# Patient Record
Sex: Male | Born: 1955
Health system: Southern US, Community
[De-identification: ages and names within clinical notes are randomized; demographics above are authoritative.]

## PROBLEM LIST (undated history)

## (undated) DIAGNOSIS — E7800 Pure hypercholesterolemia, unspecified: Secondary | ICD-10-CM

## (undated) DIAGNOSIS — M25512 Pain in left shoulder: Secondary | ICD-10-CM

## (undated) DIAGNOSIS — Z8719 Personal history of other diseases of the digestive system: Secondary | ICD-10-CM

## (undated) DIAGNOSIS — Z87898 Personal history of other specified conditions: Secondary | ICD-10-CM

## (undated) DIAGNOSIS — K429 Umbilical hernia without obstruction or gangrene: Secondary | ICD-10-CM

## (undated) DIAGNOSIS — I10 Essential (primary) hypertension: Secondary | ICD-10-CM

## (undated) DIAGNOSIS — Z8711 Personal history of peptic ulcer disease: Secondary | ICD-10-CM

## (undated) HISTORY — DX: Pure hypercholesterolemia, unspecified: E78.00

## (undated) HISTORY — PX: COLONOSCOPY: SHX174

---

## 1995-04-29 HISTORY — PX: CARDIAC CATHETERIZATION: SHX172

## 1999-08-01 ENCOUNTER — Ambulatory Visit (HOSPITAL_COMMUNITY): Admission: RE | Admit: 1999-08-01 | Discharge: 1999-08-01 | Payer: Self-pay | Admitting: Endocrinology

## 2000-03-05 ENCOUNTER — Encounter: Admission: RE | Admit: 2000-03-05 | Discharge: 2000-03-05 | Payer: Self-pay | Admitting: Endocrinology

## 2000-03-05 ENCOUNTER — Encounter: Payer: Self-pay | Admitting: Endocrinology

## 2000-05-11 ENCOUNTER — Encounter: Admission: RE | Admit: 2000-05-11 | Discharge: 2000-05-11 | Payer: Self-pay | Admitting: Internal Medicine

## 2000-07-31 ENCOUNTER — Encounter: Admission: RE | Admit: 2000-07-31 | Discharge: 2000-07-31 | Payer: Self-pay | Admitting: Internal Medicine

## 2000-10-05 ENCOUNTER — Encounter: Admission: RE | Admit: 2000-10-05 | Discharge: 2000-10-05 | Payer: Self-pay | Admitting: Internal Medicine

## 2000-12-16 ENCOUNTER — Encounter: Admission: RE | Admit: 2000-12-16 | Discharge: 2000-12-16 | Payer: Self-pay | Admitting: Internal Medicine

## 2000-12-18 ENCOUNTER — Encounter: Admission: RE | Admit: 2000-12-18 | Discharge: 2000-12-18 | Payer: Self-pay | Admitting: Internal Medicine

## 2001-03-01 ENCOUNTER — Other Ambulatory Visit: Admission: RE | Admit: 2001-03-01 | Discharge: 2001-03-01 | Payer: Self-pay | Admitting: *Deleted

## 2001-03-01 ENCOUNTER — Encounter: Admission: RE | Admit: 2001-03-01 | Discharge: 2001-03-01 | Payer: Self-pay | Admitting: Internal Medicine

## 2001-08-10 ENCOUNTER — Encounter: Admission: RE | Admit: 2001-08-10 | Discharge: 2001-08-10 | Payer: Self-pay

## 2001-09-27 ENCOUNTER — Encounter: Admission: RE | Admit: 2001-09-27 | Discharge: 2001-09-27 | Payer: Self-pay | Admitting: Internal Medicine

## 2002-04-11 ENCOUNTER — Encounter: Admission: RE | Admit: 2002-04-11 | Discharge: 2002-04-11 | Payer: Self-pay | Admitting: Internal Medicine

## 2002-04-27 ENCOUNTER — Encounter: Admission: RE | Admit: 2002-04-27 | Discharge: 2002-04-27 | Payer: Self-pay | Admitting: Internal Medicine

## 2002-09-01 ENCOUNTER — Encounter: Admission: RE | Admit: 2002-09-01 | Discharge: 2002-09-01 | Payer: Self-pay | Admitting: Internal Medicine

## 2002-09-15 ENCOUNTER — Encounter: Admission: RE | Admit: 2002-09-15 | Discharge: 2002-09-15 | Payer: Self-pay | Admitting: Internal Medicine

## 2002-09-16 ENCOUNTER — Encounter: Admission: RE | Admit: 2002-09-16 | Discharge: 2002-09-16 | Payer: Self-pay | Admitting: Internal Medicine

## 2010-09-23 ENCOUNTER — Emergency Department (HOSPITAL_COMMUNITY)
Admission: EM | Admit: 2010-09-23 | Discharge: 2010-09-23 | Disposition: A | Discharge status: Home / Self Care | Payer: Medicaid Other | Attending: Emergency Medicine | Admitting: Emergency Medicine

## 2010-09-23 ENCOUNTER — Encounter (HOSPITAL_COMMUNITY): Payer: Self-pay | Admitting: Radiology

## 2010-09-23 ENCOUNTER — Emergency Department (HOSPITAL_COMMUNITY): Payer: Medicaid Other

## 2010-09-23 ENCOUNTER — Inpatient Hospital Stay (INDEPENDENT_AMBULATORY_CARE_PROVIDER_SITE_OTHER)
Admission: RE | Admit: 2010-09-23 | Discharge: 2010-09-23 | Disposition: A | Discharge status: Home / Self Care | Payer: Self-pay | Source: Ambulatory Visit | Attending: Family Medicine | Admitting: Family Medicine

## 2010-09-23 DIAGNOSIS — Z794 Long term (current) use of insulin: Secondary | ICD-10-CM | POA: Insufficient documentation

## 2010-09-23 DIAGNOSIS — E119 Type 2 diabetes mellitus without complications: Secondary | ICD-10-CM | POA: Insufficient documentation

## 2010-09-23 DIAGNOSIS — I10 Essential (primary) hypertension: Secondary | ICD-10-CM | POA: Insufficient documentation

## 2010-09-23 DIAGNOSIS — K3580 Unspecified acute appendicitis: Secondary | ICD-10-CM

## 2010-09-23 DIAGNOSIS — Z79899 Other long term (current) drug therapy: Secondary | ICD-10-CM | POA: Insufficient documentation

## 2010-09-23 DIAGNOSIS — R1031 Right lower quadrant pain: Secondary | ICD-10-CM | POA: Insufficient documentation

## 2010-09-23 HISTORY — DX: Essential (primary) hypertension: I10

## 2010-09-23 LAB — CBC
HCT: 42.5 % (ref 39.0–52.0)
Hemoglobin: 14.6 g/dL (ref 13.0–17.0)
MCH: 29.2 pg (ref 26.0–34.0)
MCHC: 34.4 g/dL (ref 30.0–36.0)
MCV: 85 fL (ref 78.0–100.0)
Platelets: 243 10*3/uL (ref 150–400)
RBC: 5 MIL/uL (ref 4.22–5.81)
RDW: 13.3 % (ref 11.5–15.5)
WBC: 7.6 10*3/uL (ref 4.0–10.5)

## 2010-09-23 LAB — POCT URINALYSIS DIP (DEVICE)
Bilirubin Urine: NEGATIVE
Glucose, UA: 250 mg/dL — AB
Hgb urine dipstick: NEGATIVE
Ketones, ur: NEGATIVE mg/dL
Nitrite: NEGATIVE
Protein, ur: NEGATIVE mg/dL
Specific Gravity, Urine: 1.02 (ref 1.005–1.030)
Urobilinogen, UA: 0.2 mg/dL (ref 0.0–1.0)
pH: 6 (ref 5.0–8.0)

## 2010-09-23 LAB — URINALYSIS, ROUTINE W REFLEX MICROSCOPIC
Bilirubin Urine: NEGATIVE
Glucose, UA: NEGATIVE mg/dL
Hgb urine dipstick: NEGATIVE
Ketones, ur: NEGATIVE mg/dL
Nitrite: NEGATIVE
pH: 6.5 (ref 5.0–8.0)

## 2010-09-23 LAB — COMPREHENSIVE METABOLIC PANEL
ALT: 20 U/L (ref 0–53)
AST: 19 U/L (ref 0–37)
CO2: 25 mEq/L (ref 19–32)
Calcium: 9.1 mg/dL (ref 8.4–10.5)
GFR calc Af Amer: >60 mL/min (ref >60)
GFR calc non Af Amer: >60 mL/min (ref >60)
Sodium: 136 mEq/L (ref 135–145)

## 2010-09-23 LAB — DIFFERENTIAL
Basophils Absolute: 0 10*3/uL (ref 0.0–0.1)
Basophils Relative: 0 % (ref 0–1)
Eosinophils Absolute: 0 10*3/uL (ref 0.0–0.7)
Eosinophils Relative: 0 % (ref 0–5)
Lymphocytes Relative: 13 % (ref 12–46)
Lymphs Abs: 1 10*3/uL (ref 0.7–4.0)
Monocytes Absolute: 0.4 10*3/uL (ref 0.1–1.0)
Monocytes Relative: 5 % (ref 3–12)
Neutro Abs: 6.2 10*3/uL (ref 1.7–7.7)
Neutrophils Relative %: 82 % — ABNORMAL HIGH (ref 43–77)

## 2010-09-23 LAB — GLUCOSE, CAPILLARY: Glucose-Capillary: 124 mg/dL — ABNORMAL HIGH (ref 70–99)

## 2010-09-23 MED ORDER — IOHEXOL 300 MG/ML  SOLN
100.0000 mL | Freq: Once | INTRAMUSCULAR | Status: AC | PRN
  Administered 2010-09-23: 100 mL via INTRAVENOUS

## 2011-06-26 ENCOUNTER — Encounter (HOSPITAL_COMMUNITY): Payer: Self-pay | Admitting: Emergency Medicine

## 2011-06-26 ENCOUNTER — Emergency Department (INDEPENDENT_AMBULATORY_CARE_PROVIDER_SITE_OTHER)
Admission: EM | Admit: 2011-06-26 | Discharge: 2011-06-26 | Disposition: A | Discharge status: Home Health | Payer: Self-pay | Source: Home / Self Care | Attending: Emergency Medicine | Admitting: Emergency Medicine

## 2011-06-26 DIAGNOSIS — S161XXA Strain of muscle, fascia and tendon at neck level, initial encounter: Secondary | ICD-10-CM

## 2011-06-26 DIAGNOSIS — S139XXA Sprain of joints and ligaments of unspecified parts of neck, initial encounter: Secondary | ICD-10-CM

## 2011-06-26 DIAGNOSIS — S335XXA Sprain of ligaments of lumbar spine, initial encounter: Secondary | ICD-10-CM

## 2011-06-26 DIAGNOSIS — S39012A Strain of muscle, fascia and tendon of lower back, initial encounter: Secondary | ICD-10-CM

## 2011-06-26 MED ORDER — MELOXICAM 15 MG PO TABS: 15.0000 mg | ORAL_TABLET | Freq: Every day | ORAL | Status: AC

## 2011-06-26 MED ORDER — HYDROCODONE-ACETAMINOPHEN 5-325 MG PO TABS: 2.0000 | ORAL_TABLET | ORAL | Status: AC | PRN

## 2011-06-26 MED ORDER — METHOCARBAMOL 500 MG PO TABS: 500.0000 mg | ORAL_TABLET | Freq: Four times a day (QID) | ORAL | Status: AC
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
## 2011-06-26 NOTE — Discharge Instructions (Signed)
Take the medication as written. Take 1 gram of tylenol with the motrin up to 4 times a day as needed for pain and fever. This is an effective combination for pain. Take the hydrocodone/norco only for severe pain. Do not take the tylenol and hydrocodone/norco/  as they both have tylenol in them and too much can hurt your liver. Do the exercises twice a day for 5- 10 seconds at a time when you are feeling better. Return if you get worse, have a persistent fever >100.4, or for any concerns.

## 2011-08-03 ENCOUNTER — Other Ambulatory Visit (HOSPITAL_COMMUNITY): Payer: Self-pay | Admitting: Chiropractic Medicine

## 2011-08-03 ENCOUNTER — Ambulatory Visit (HOSPITAL_COMMUNITY)
Admission: RE | Admit: 2011-08-03 | Discharge: 2011-08-03 | Disposition: A | Discharge status: Home / Self Care | Payer: Self-pay | Source: Ambulatory Visit | Attending: Chiropractic Medicine | Admitting: Chiropractic Medicine

## 2011-08-03 DIAGNOSIS — M545 Low back pain, unspecified: Secondary | ICD-10-CM | POA: Insufficient documentation

## 2011-08-03 DIAGNOSIS — R52 Pain, unspecified: Secondary | ICD-10-CM

## 2012-11-15 ENCOUNTER — Telehealth: Payer: Self-pay | Admitting: Endocrinology

## 2012-11-15 NOTE — Telephone Encounter (Signed)
Instructed pt to call his insurance company to find out what strips they will pay for, he will call back when he finds out

## 2012-11-18 ENCOUNTER — Other Ambulatory Visit: Payer: Self-pay | Admitting: *Deleted

## 2012-11-18 MED ORDER — INSULIN ASPART 100 UNIT/ML FLEXPEN: 20.0000 [IU] | PEN_INJECTOR | Freq: Three times a day (TID) | SUBCUTANEOUS | Status: DC

## 2012-11-22 ENCOUNTER — Other Ambulatory Visit: Payer: Self-pay | Admitting: *Deleted

## 2012-11-22 MED ORDER — GLUCOSE BLOOD VI STRP: ORAL_STRIP | Status: DC

## 2012-11-26 ENCOUNTER — Other Ambulatory Visit: Payer: Self-pay | Admitting: *Deleted

## 2012-11-26 MED ORDER — ATORVASTATIN CALCIUM 20 MG PO TABS: 20.0000 mg | ORAL_TABLET | Freq: Every day | ORAL | Status: DC

## 2012-11-26 MED ORDER — INSULIN GLARGINE 100 UNIT/ML ~~LOC~~ SOLN: SUBCUTANEOUS | Status: DC

## 2012-12-08 ENCOUNTER — Ambulatory Visit: Payer: Medicaid Other | Admitting: Endocrinology

## 2012-12-10 ENCOUNTER — Ambulatory Visit (INDEPENDENT_AMBULATORY_CARE_PROVIDER_SITE_OTHER): Payer: No Typology Code available for payment source | Admitting: Endocrinology

## 2012-12-10 ENCOUNTER — Other Ambulatory Visit: Payer: Self-pay | Admitting: *Deleted

## 2012-12-10 ENCOUNTER — Encounter: Payer: Self-pay | Admitting: Endocrinology

## 2012-12-10 VITALS — BP 140/84 | HR 56 | Temp 97.9°F | Resp 12 | Ht 65.25 in | Wt 185.3 lb

## 2012-12-10 DIAGNOSIS — G5710 Meralgia paresthetica, unspecified lower limb: Secondary | ICD-10-CM

## 2012-12-10 DIAGNOSIS — IMO0001 Reserved for inherently not codable concepts without codable children: Secondary | ICD-10-CM

## 2012-12-10 DIAGNOSIS — E118 Type 2 diabetes mellitus with unspecified complications: Secondary | ICD-10-CM | POA: Insufficient documentation

## 2012-12-10 DIAGNOSIS — E7800 Pure hypercholesterolemia, unspecified: Secondary | ICD-10-CM

## 2012-12-10 DIAGNOSIS — G5711 Meralgia paresthetica, right lower limb: Secondary | ICD-10-CM

## 2012-12-10 DIAGNOSIS — I10 Essential (primary) hypertension: Secondary | ICD-10-CM | POA: Insufficient documentation

## 2012-12-10 LAB — MICROALBUMIN / CREATININE URINE RATIO
Creatinine,U: 154.2 mg/dL
Microalb Creat Ratio: 0.6 mg/g (ref 0.0–30.0)
Microalb, Ur: 1 mg/dL (ref 0.0–1.9)

## 2012-12-10 LAB — LIPID PANEL
HDL: 47.5 mg/dL (ref >39.00)
Total CHOL/HDL Ratio: 4
Triglycerides: 103 mg/dL (ref 0.0–149.0)
VLDL: 20.6 mg/dL (ref 0.0–40.0)

## 2012-12-10 LAB — COMPREHENSIVE METABOLIC PANEL
BUN: 10 mg/dL (ref 6–23)
CO2: 29 mEq/L (ref 19–32)
Calcium: 8.9 mg/dL (ref 8.4–10.5)
Chloride: 103 mEq/L (ref 96–112)
Creatinine, Ser: 0.8 mg/dL (ref 0.4–1.5)
GFR: 131.56 mL/min (ref >60.00)
Glucose, Bld: 199 mg/dL — ABNORMAL HIGH (ref 70–99)

## 2012-12-10 LAB — URINALYSIS, ROUTINE W REFLEX MICROSCOPIC
Hgb urine dipstick: NEGATIVE
Ketones, ur: NEGATIVE
RBC / HPF: NONE SEEN (ref 0–?)
Total Protein, Urine: NEGATIVE
Urine Glucose: >=1000
Urobilinogen, UA: 0.2 (ref 0.0–1.0)

## 2012-12-10 MED ORDER — GLUCOSE BLOOD VI STRP: ORAL_STRIP | Status: DC

## 2012-12-10 MED ORDER — AMLODIPINE BESYLATE 2.5 MG PO TABS: 2.5000 mg | ORAL_TABLET | Freq: Every day | ORAL | Status: DC

## 2012-12-10 NOTE — Progress Notes (Signed)
Patient ID: Austin Norris, male   DOB: 1955-11-12, 57 y.o.   MRN: [id number]  Austin Norris is an 57 y.o. male.   Reason for Appointment: Diabetes follow-up   History of Present Illness   Diagnosis: Type 2 DIABETES MELITUS, long-standing      He has been on insulin to treat his diabetes for several years partly because of failure of oral agents and also for reducing cost of medications He is generally checking his blood sugars once or twice a day but does not keep a record Difficult to review his blood sugars at home since he has a generic monitor and not clear if date and time is accurate Also because of his recent religious fast he is eating and waking up at 3 or 4 in the morning along with late evening meals. Not clear which readings are postprandial  He thinks that his blood sugars may be sporadically over 200 from eating sweets but he does not plan ahead and give himself extra insulin. He will take extra insulin when the blood sugar goes up after eating. He also does not like to take any mealtime coverage for his lunch because he is at work and is walking more. Usually does not check readings after lunch.  Oral hypoglycemic drugs: Metformin        Side effects from medications: None Insulin regimen: Lantus usually 48 units at bedtime, NovoLog 20 before meals         Proper timing of medications in relation to meals: Yes.         Monitors blood glucose: Once a day.    Glucometer: One Touch.          Blood Glucose readings from meter download: readings before breakfast: 155 136, after breakfast 281;    hs 98, 168.  Hypoglycemia frequency:  none recently.          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  Always use your most recent med list.               atorvastatin 20 MG tablet  Commonly known as:  LIPITOR  Take 1 tablet (20 mg total) by mouth daily.     insulin aspart 100 UNIT/ML Sopn FlexPen  Commonly known as:  NOVOLOG FLEXPEN  Inject 20 Units into the skin 3 (three) times daily with meals.     insulin glargine 100 UNIT/ML injection  Commonly known as:  LANTUS  Inject 48 units at bedtime     lisinopril 40 MG tablet  Commonly known as:  PRINIVIL,ZESTRIL  Take 40 mg by mouth daily.     metFORMIN 500 MG (MOD) 24 hr tablet  Commonly known as:  GLUMETZA  Take 500 mg by mouth 2 (two) times daily with a meal.        Allergies: No Known Allergies  Past Medical History  Diagnosis Date  . Diabetes mellitus   .  Hypertension     No past surgical history on file.  No family history on file.  Social History:  reports that he has never smoked. He does not have any smokeless tobacco history on file. He reports that he does not drink alcohol or use illicit drugs.  Review of Systems:  HYPERTENSION:  he has had long-standing hypertension, he thinks his systolic readings are 140-160 at home, frequently high but he does not keep a record. Is on HCTZ along with his lisinopril and blood pressure appears to be relatively high at home and somewhat high in the office today  HYPERLIPIDEMIA: The lipid abnormality consists of elevated LDL. Again has been irregular with taking his Lipitor and he is just not wanting to take medications. He has had a history of coronary artery disease but not clear to what extent, records not available  No history of numbness or tingling in his feet. However he is complaining about persistent numbness in his right outer thigh with occasional sharp pains not interfering with sleep     Examination:   BP 140/84  Pulse 56  Temp(Src) 97.9 F (36.6 C)  Resp 12  Ht 5' 5.25"  (1.657 m)  Wt 185 lb 4.8 oz (84.052 kg)  BMI 30.61 kg/m2  SpO2 96%  Body mass index is 30.61 kg/(m^2).   He has a slightly raised oval dark pigmented area on the left proximal foot with some dryness on the surface No pedal edema  ASSESSMENT/ PLAN::   Diabetes type 2   The patient's diabetes control appears to be fair with some high readings if he is not watching his diet Surprisingly he is having relatively good readings without even doing the Lantus at night while fasting during the day He has not checked many readings after meals and difficult to assess his blood sugar patterns as he does not keep a record of his blood sugars Also not clear if his generic monitor is accurate, difficulty the time on this   Meralgia paresthetica: He still has some numbness which is persistent and occasional pain, does not require medications for discomfort  HYPERTENSION: His blood pressure is still relatively high and he thinks it is higher at home. Will add amlodipine 2.5 mg  Hyperlipidemia: He is not taking his Lipitor regularly because of fear of side effects. Discussed importance of managing hyperlipidemia and  treatment with statins with having 2 other risk factors at least for cardiovascular prevention  No evidence of peripheral neuropathy on exam today  Austin Norris 12/10/2012, 11:14 AM

## 2012-12-10 NOTE — Patient Instructions (Signed)
Please check blood sugars at least half the time about 2 hours after any meal and as directed on waking up. Please bring blood sugar monitor to each visit  Take Lipitor daily

## 2012-12-14 ENCOUNTER — Other Ambulatory Visit: Payer: Self-pay | Admitting: *Deleted

## 2012-12-14 MED ORDER — ATORVASTATIN CALCIUM 20 MG PO TABS: 20.0000 mg | ORAL_TABLET | Freq: Every day | ORAL | Status: DC

## 2012-12-20 ENCOUNTER — Other Ambulatory Visit: Payer: Self-pay | Admitting: *Deleted

## 2012-12-20 MED ORDER — INSULIN ASPART 100 UNIT/ML FLEXPEN: 20.0000 [IU] | PEN_INJECTOR | Freq: Three times a day (TID) | SUBCUTANEOUS | Status: DC

## 2012-12-21 ENCOUNTER — Other Ambulatory Visit: Payer: Self-pay | Admitting: *Deleted

## 2013-01-31 ENCOUNTER — Other Ambulatory Visit: Payer: No Typology Code available for payment source

## 2013-03-11 ENCOUNTER — Other Ambulatory Visit: Payer: Self-pay | Admitting: *Deleted

## 2013-03-11 ENCOUNTER — Other Ambulatory Visit (INDEPENDENT_AMBULATORY_CARE_PROVIDER_SITE_OTHER): Payer: No Typology Code available for payment source

## 2013-03-11 DIAGNOSIS — E7800 Pure hypercholesterolemia, unspecified: Secondary | ICD-10-CM

## 2013-03-11 DIAGNOSIS — IMO0001 Reserved for inherently not codable concepts without codable children: Secondary | ICD-10-CM

## 2013-03-11 LAB — COMPREHENSIVE METABOLIC PANEL
ALT: 15 U/L (ref 0–53)
CO2: 28 mEq/L (ref 19–32)
Calcium: 9.2 mg/dL (ref 8.4–10.5)
Chloride: 101 mEq/L (ref 96–112)
Creatinine, Ser: 1 mg/dL (ref 0.4–1.5)
GFR: 102.21 mL/min (ref >60.00)
Sodium: 136 mEq/L (ref 135–145)
Total Protein: 6.9 g/dL (ref 6.0–8.3)

## 2013-03-11 LAB — HEMOGLOBIN A1C: Hgb A1c MFr Bld: 7.9 % — ABNORMAL HIGH (ref 4.6–6.5)

## 2013-03-11 LAB — LIPID PANEL: Triglycerides: 173 mg/dL — ABNORMAL HIGH (ref 0.0–149.0)

## 2013-03-11 LAB — MICROALBUMIN / CREATININE URINE RATIO: Microalb, Ur: 0.2 mg/dL (ref 0.0–1.9)

## 2013-03-11 MED ORDER — QUINAPRIL HCL 40 MG PO TABS: 40.0000 mg | ORAL_TABLET | Freq: Every day | ORAL | Status: DC

## 2013-03-14 ENCOUNTER — Ambulatory Visit (INDEPENDENT_AMBULATORY_CARE_PROVIDER_SITE_OTHER): Payer: No Typology Code available for payment source | Admitting: Endocrinology

## 2013-03-14 ENCOUNTER — Encounter: Payer: Self-pay | Admitting: Endocrinology

## 2013-03-14 ENCOUNTER — Other Ambulatory Visit: Payer: Self-pay | Admitting: *Deleted

## 2013-03-14 ENCOUNTER — Ambulatory Visit: Payer: No Typology Code available for payment source | Admitting: Endocrinology

## 2013-03-14 VITALS — BP 110/66 | HR 71 | Temp 98.6°F | Resp 12 | Ht 65.0 in | Wt 192.0 lb

## 2013-03-14 DIAGNOSIS — I10 Essential (primary) hypertension: Secondary | ICD-10-CM

## 2013-03-14 DIAGNOSIS — Z23 Encounter for immunization: Secondary | ICD-10-CM

## 2013-03-14 DIAGNOSIS — IMO0001 Reserved for inherently not codable concepts without codable children: Secondary | ICD-10-CM

## 2013-03-14 DIAGNOSIS — R1084 Generalized abdominal pain: Secondary | ICD-10-CM

## 2013-03-14 DIAGNOSIS — E7800 Pure hypercholesterolemia, unspecified: Secondary | ICD-10-CM

## 2013-03-14 MED ORDER — CANAGLIFLOZIN 100 MG PO TABS: 100.0000 mg | ORAL_TABLET | Freq: Every day | ORAL | Status: DC

## 2013-03-14 MED ORDER — INSULIN GLARGINE 100 UNIT/ML ~~LOC~~ SOLN: SUBCUTANEOUS | Status: DC

## 2013-03-14 NOTE — Progress Notes (Signed)
Patient ID: Austin Norris, male   DOB: [birth year], 57 y.o.   MRN: [id number]   Reason for Appointment: Diabetes follow-up   History of Present Illness   Diagnosis: Type 2 DIABETES MELITUS, long-standing     He has been on insulin to treat his diabetes for several years partly because of failure of oral agents and also for reducing cost of medications He is generally checking his blood sugars once or twice a day but does not keep a record Overall has had somewhat poor control Usually did not bring his monitor for review and does not keep a record of his glucose readings He is probably checking his blood sugars mostly in the mornings and occasionally at bedtime However his blood sugars appear to be higher after breakfast when they are checked in the lab  He thinks that his blood sugars may be sporadically over 200 from eating sweets at times. He also does not like to take any mealtime coverage for his lunch because he is at work and is walking more. Usually does not check readings after lunch.  Oral hypoglycemic drugs: Metformin 1g at dinner       Side effects from medications: None Insulin regimen: Lantus usually 48 units at bedtime, NovoLog 20-25 before supper        Proper timing of medications in relation to meals: Yes.         Monitors blood glucose: Once a day.    Glucometer: ?         Blood Glucose readings from meter download: readings before breakfast: 110-130 usually; hs 140-170  Hypoglycemia frequency:  none recently.           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 Microalb, Ur 03/11/2013 0.2  0.0 - 1.9 mg/dL Final  . Creatinine,U 78/29/5621 95.5   Final  . Microalb Creat Ratio 03/11/2013 0.2  0.0 - 30.0 mg/g Final  . Cholesterol 03/11/2013 191  0 - 200 mg/dL Final   ATP III Classification       Desirable:  < 200 mg/dL               Borderline High:  200 - 239 mg/dL          High:  > = 308 mg/dL  . Triglycerides 03/11/2013 173.0* 0.0 - 149.0 mg/dL Final   Normal:  <657 mg/dLBorderline High:  150 - 199 mg/dL  . HDL 03/11/2013 42.30  >39.00 mg/dL Final  . VLDL 84/69/6295 34.6  0.0 - 40.0 mg/dL Final  . LDL Cholesterol 03/11/2013 114* 0 - 99 mg/dL Final  . Total CHOL/HDL Ratio 03/11/2013 5   Final  Men          Women1/2 Average Risk     3.4          3.3Average Risk          5.0          4.42X Average Risk          9.6          7.13X Average Risk          15.0          11.0                      . Hemoglobin A1C 03/11/2013 7.9* 4.6 - 6.5 % Final   Glycemic Control Guidelines for People with Diabetes:Non Diabetic:  <6%Goal of Therapy: <7%Additional Action Suggested:  >8%   . Sodium 03/11/2013 136  135 - 145 mEq/L Final  . Potassium 03/11/2013 4.3  3.5 - 5.1 mEq/L Final  . Chloride 03/11/2013 101  96 - 112 mEq/L Final  . CO2 03/11/2013 28  19 - 32 mEq/L Final  . Glucose, Bld 03/11/2013 192* 70 - 99 mg/dL Final  . BUN 56/21/3086 11  6 - 23 mg/dL Final  . Creatinine, Ser 03/11/2013 1.0  0.4 - 1.5 mg/dL Final  . Total Bilirubin 03/11/2013 0.5  0.3 - 1.2 mg/dL Final  . Alkaline Phosphatase 03/11/2013 71  39 - 117 U/L Final  . AST 03/11/2013 19  0 - 37 U/L Final  . ALT 03/11/2013 15  0 - 53 U/L Final  . Total Protein 03/11/2013 6.9  6.0 - 8.3 g/dL Final  . Albumin 57/84/6962 3.9  3.5 - 5.2 g/dL Final  . Calcium 95/28/4132 9.2  8.4 - 10.5 mg/dL Final  . GFR 44/04/270  102.21  >60.00 mL/min Final      Medication List       This list is accurate as of: 03/14/13  8:46 AM.  Always use your most recent med list.               amLODipine 2.5 MG tablet  Commonly known as:  NORVASC  Take 1 tablet (2.5 mg total) by mouth daily.     atorvastatin 20 MG tablet  Commonly known as:  LIPITOR  Take 1 tablet (20 mg total) by mouth daily.     glucose blood test strip  Commonly known as:  FREESTYLE TEST STRIPS  Use as instructed to check blood sugars once a day     hydrochlorothiazide 12.5 MG capsule  Commonly known as:  MICROZIDE  Take 12.5 mg by mouth daily.     insulin aspart 100 UNIT/ML Sopn FlexPen  Commonly known as:  NOVOLOG FLEXPEN  Inject 20 Units into the skin 3 (three) times daily with meals.     insulin glargine 100 UNIT/ML injection  Commonly known as:  LANTUS  Inject 48 units at bedtime     lisinopril 40 MG tablet  Commonly known as:  PRINIVIL,ZESTRIL  Take 40 mg by mouth daily.     metFORMIN 500 MG (MOD) 24 hr tablet  Commonly known as:  GLUMETZA  Take 500 mg by mouth 2 (two) times daily with a meal.     quinapril 40 MG tablet  Commonly known as:  ACCUPRIL  Take 1 tablet (40 mg total) by mouth at bedtime.        Allergies: No Known Allergies  Past Medical History  Diagnosis Date  . Diabetes mellitus   .  Hypertension     No past surgical history on file.  No family history on file. No colon cancer  Social History:  reports that he has never smoked. He does not have any smokeless tobacco history on file. He reports that he does not drink alcohol or use illicit drugs.  Review of Systems:  HYPERTENSION:  he has had long-standing hypertension, he thinks his systolic readings are 130-140 Is taking low dose amlodipine and HCTZ along with his Accupril and blood pressure appears to be lower than usual  HYPERLIPIDEMIA: The lipid abnormality consists of elevated LDL. Again has been irregular with taking his Lipitor and he  is just not wanting to take an additional medication. Discussed importance of prevention of CAD with this. Has had some evidence of CAD in the past but currently asymptomatic  No history of numbness or tingling in his feet. Has persistent numbness in his right outer thigh with occasional sharp pains not interfering with sleep, likely to be meralgia paresthetica  He is complaining of nonspecific gaseousness and postprandial discomfort which is chronic especially with certain foods      Examination:   BP 110/66  Pulse 71  Temp(Src) 98.6 F (37 C)  Resp 12  Ht 5\' 5"  (1.651 m)  Wt 192 lb (87.091 kg)  BMI 31.95 kg/m2  SpO2 96%  Body mass index is 31.95 kg/(m^2).   No pedal edema  ASSESSMENT/ PLAN:   Diabetes type 2   The patient's diabetes control appears to be  still inadequate with A1c near 8% Since his fasting readings are reportedly fairly good and he does not always have high readings after supper he may be having high readings during the day including after a high carbohydrate breakfast Usually does not bring his glucose monitor to assess his readings and lab glucose is frequently high. Still using generic monitor  Also he has gained weight because of inadequate diet management when traveling   He is a good candidate for adding Invokana to his regimen since he has persistent high blood sugars and difficulty losing weight as well as probable hyperglycemia during the day. Discussed mechanism of action, benefits with hyperglycemia, weight loss and blood pressure as well as long-term benefit on glucose control and weight. He will start with 100 mg and take it in the morning, starting with samples He will need to reduce his Lantus to about 45 and probably NovoLog by about 5 minutes He will still need to take some insulin for his breakfast and check more readings after breakfast and afternoon snack Reminded him to check more readings after meals and bring monitor for  review  HYPERTENSION: His blood pressure is  excellent, since he is complaining of diaphoresis with HCTZ we will stop this and may have good control with adding Invokana.  Hyperlipidemia: He is not taking his Lipitor regularly because of fear of side effects. Discussed importance of managing hyperlipidemia and  treatment with statins with having 2 other risk factors at least for cardiovascular prevention  Colon cancer Screening: He will be referred for colonoscopy and he can discuss his long-standing GI symptoms with gastroenterologist Have also reminded him to have a full exam with PCP   [facility] 03/14/2013, 8:46 AM

## 2013-03-14 NOTE — Patient Instructions (Signed)
Please check blood sugars at least half the time about 2 hours after any meal and as directed on waking up.  Please bring blood sugar monitor to each visit  Stop Amlodipine and Hydrochlorthiazide  Invokana 100mg  in ams  Take at least 5 Novolog at breakfast  Watch diet, less starches

## 2013-03-16 ENCOUNTER — Ambulatory Visit (INDEPENDENT_AMBULATORY_CARE_PROVIDER_SITE_OTHER): Payer: No Typology Code available for payment source | Admitting: Internal Medicine

## 2013-03-16 ENCOUNTER — Encounter: Payer: Self-pay | Admitting: Internal Medicine

## 2013-03-16 VITALS — BP 138/70 | HR 60 | Ht 65.0 in | Wt 193.8 lb

## 2013-03-16 DIAGNOSIS — R143 Flatulence: Secondary | ICD-10-CM

## 2013-03-16 DIAGNOSIS — E1165 Type 2 diabetes mellitus with hyperglycemia: Secondary | ICD-10-CM

## 2013-03-16 DIAGNOSIS — Z1211 Encounter for screening for malignant neoplasm of colon: Secondary | ICD-10-CM

## 2013-03-16 DIAGNOSIS — R141 Gas pain: Secondary | ICD-10-CM

## 2013-03-16 MED ORDER — MOVIPREP 100 G PO SOLR: 1.0000 | Freq: Once | ORAL | Status: DC

## 2013-03-16 NOTE — Patient Instructions (Signed)

## 2013-03-16 NOTE — Progress Notes (Signed)
HISTORY OF PRESENT ILLNESS:  Austin Norris is a 57 y.o. male with long-standing type 2 diabetes mellitus, hypertension, and hyperlipidemia. He is sent today by his primary provider regarding chronic abdominal complaints and the need for screening colonoscopy. Patient tells me that he will underwent colonoscopy greater than 10 years ago. He is not sure where, with whom, or the results. His chronic GI complaint is that of bloating. This is most noticeable after certain meals such as fried foods, oils, or seeds. Also, he notices bloating with stress. No true pain. Remote history of ulcer disease. Bowel habits are regular. Weight has been stable. Does have some reflux symptoms when he consumes citrus. No dysphagia. No NSAID use. No other complaints.  REVIEW OF SYSTEMS:  All non-GI ROS negative except for headache  Past Medical History  Diagnosis Date  . Diabetes mellitus   . Hypertension   . Hypercholesteremia     History reviewed. No pertinent past surgical history.  Social History Austin Norris  reports that he has quit smoking. He has never used smokeless tobacco. He reports that he does not drink alcohol or use illicit drugs.  family history includes Diabetes in his father and sister; Heart disease in his mother.  No Known Allergies     PHYSICAL EXAMINATION: Vital signs: BP 138/70  Pulse 60  Ht 5\' 5"  (1.651 m)  Wt 193 lb 12.8 oz (87.907 kg)  BMI 32.25 kg/m2  Constitutional: generally well-appearing, no acute distress Psychiatric: alert and oriented x3, cooperative Eyes: extraocular movements intact, anicteric, conjunctiva pink Mouth: oral pharynx moist, no lesions Neck: supple no lymphadenopathy Cardiovascular: heart regular rate and rhythm, no murmur Lungs: clear to auscultation bilaterally Abdomen: soft, nontender, nondistended, no obvious ascites, no peritoneal signs, normal bowel sounds, no organomegaly Rectal: Deferred until colonoscopy Extremities: no lower  extremity edema bilaterally Skin: no lesions on visible extremities Neuro: No focal deficits.   ASSESSMENT:  #1. Abdominal gas and bloating. Chronic. Exacerbated by certain meals and stress. Sounds entirely functional. #2. Colon cancer screening. Baseline risk. No contraindications. #3. General medical problems including insulin requiring diabetes   PLAN:  #1. Information on intestinal gas. #2. Anti-gas and flatulence dietary sheet #3. Screening colonoscopy.The nature of the procedure, as well as the risks, benefits, and alternatives were carefully and thoroughly reviewed with the patient. Ample time for discussion and questions allowed. The patient understood, was satisfied, and agreed to proceed. #4. Movi prep prescribed. Patient instructed on its use #5. Give one half evening insulin. Hold a.m. metformin and insulin the day of the procedure until regular by mouth intake resumed

## 2013-03-17 ENCOUNTER — Other Ambulatory Visit: Payer: Self-pay | Admitting: *Deleted

## 2013-03-17 MED ORDER — METFORMIN HCL ER (MOD) 500 MG PO TB24: 500.0000 mg | ORAL_TABLET | Freq: Two times a day (BID) | ORAL | Status: DC

## 2013-03-18 ENCOUNTER — Telehealth: Payer: Self-pay | Admitting: Gastroenterology

## 2013-03-18 DIAGNOSIS — R143 Flatulence: Secondary | ICD-10-CM

## 2013-03-18 DIAGNOSIS — R141 Gas pain: Secondary | ICD-10-CM

## 2013-03-18 DIAGNOSIS — Z1211 Encounter for screening for malignant neoplasm of colon: Secondary | ICD-10-CM

## 2013-03-18 MED ORDER — MOVIPREP 100 G PO SOLR: 1.0000 | Freq: Once | ORAL | Status: DC

## 2013-03-18 MED ORDER — PEG-KCL-NACL-NASULF-NA ASC-C 100 G PO SOLR: 1.0000 | Freq: Once | ORAL | Status: DC

## 2013-03-18 NOTE — Telephone Encounter (Signed)
moviprep not at his pharmacy so I called in again

## 2013-03-21 ENCOUNTER — Encounter: Payer: No Typology Code available for payment source | Admitting: Internal Medicine

## 2013-03-21 ENCOUNTER — Ambulatory Visit (AMBULATORY_SURGERY_CENTER): Payer: No Typology Code available for payment source | Admitting: Internal Medicine

## 2013-03-21 ENCOUNTER — Encounter: Payer: Self-pay | Admitting: Internal Medicine

## 2013-03-21 VITALS — BP 152/75 | HR 58 | Temp 97.5°F | Resp 18 | Ht 65.0 in | Wt 193.0 lb

## 2013-03-21 DIAGNOSIS — Z1211 Encounter for screening for malignant neoplasm of colon: Secondary | ICD-10-CM

## 2013-03-21 DIAGNOSIS — D126 Benign neoplasm of colon, unspecified: Secondary | ICD-10-CM

## 2013-03-21 LAB — GLUCOSE, CAPILLARY: Glucose-Capillary: 119 mg/dL — ABNORMAL HIGH (ref 70–99)

## 2013-03-21 MED ORDER — SODIUM CHLORIDE 0.9 % IV SOLN: 500.0000 mL | INTRAVENOUS | Status: DC

## 2013-03-21 NOTE — Progress Notes (Signed)
Patient did not experience any of the following events: a burn prior to discharge; a fall within the facility; wrong site/side/patient/procedure/implant event; or a hospital transfer or hospital admission upon discharge from the facility. (G8907) Patient did not have preoperative order for IV antibiotic SSI prophylaxis. (G8918)  

## 2013-03-21 NOTE — Patient Instructions (Signed)

## 2013-03-21 NOTE — Op Note (Signed)
[facility] [address] [place], [address]   COLONOSCOPY PROCEDURE REPORT  PATIENT: Norris, Austin  MR#: [id number] BIRTHDATE: 01/25/1956 , 57  yrs. old GENDER: Male ENDOSCOPIST: Roxy Cedar, MD REFERRED XB:JYNW Kumar, M.D. PROCEDURE DATE:  03/21/2013 PROCEDURE:   Colonoscopy with snare polypectomy x 2 First Screening Colonoscopy - Avg.  risk and is 50 yrs.  old or older Yes.  Prior Negative Screening - Now for repeat screening. N/A  History of Adenoma - Now for follow-up colonoscopy & has been > or = to 3 yrs.  N/A  Polyps Removed Today? Yes. ASA CLASS:   Class II INDICATIONS:average risk screening. MEDICATIONS: MAC sedation, administered by CRNA and propofol (Diprivan) 340mg  IV  DESCRIPTION OF PROCEDURE:   After the risks benefits and alternatives of the procedure were thoroughly explained, informed consent was obtained.  A digital rectal exam revealed no abnormalities of the rectum.   The LB [id number]  endoscope was introduced through the anus and advanced to the cecum, which was identified by both the appendix and ileocecal valve. No adverse events experienced.   The quality of the prep was excellent, using MoviPrep  The instrument was then slowly withdrawn as the colon was fully examined.    COLON FINDINGS: Two diminutive polyps were found at the cecum and in the sigmoid colon.  A polypectomy was performed with a cold snare. The resection was complete and the polyp tissue was completely retrieved.   The colon mucosa was otherwise normal.  Retroflexed views revealed no abnormalities. The time to cecum=2 minutes 37 seconds.  Withdrawal time=13 minutes 35 seconds.  The scope was withdrawn and the procedure completed. COMPLICATIONS: There were no complications.  ENDOSCOPIC IMPRESSION: 1.   Two diminutive polyps were found at the cecum and in the sigmoid colon; polypectomy was performed with a cold snare 2.   The colon mucosa was  otherwise normal  RECOMMENDATIONS: 1. Repeat colonoscopy in 5 years if polyp adenomatous; otherwise 10 years   eSigned:  Roxy Cedar, MD 03/21/2013 12:16 PM   cc: Reather Littler, MD and The Patient

## 2013-03-21 NOTE — Progress Notes (Signed)
Procedure endsa to recovery, report given and VSS

## 2013-03-21 NOTE — Progress Notes (Signed)
Called to room to assist during endoscopic procedure.  Patient ID and intended procedure confirmed with present staff. Received instructions for my participation in the procedure from the performing physician.  

## 2013-03-22 ENCOUNTER — Telehealth: Payer: Self-pay | Admitting: Endocrinology

## 2013-03-22 ENCOUNTER — Telehealth: Payer: Self-pay | Admitting: *Deleted

## 2013-03-22 NOTE — Telephone Encounter (Signed)
No answer on callback f/u

## 2013-03-22 NOTE — Telephone Encounter (Signed)
Pt states pharmacy does not have Invokana till 03/25/13 Pt would like to get some samples please  Invokana 100 mg  Call back [phone]  Thank You :)

## 2013-03-22 NOTE — Telephone Encounter (Signed)
Samples left, patient is aware 

## 2013-03-23 ENCOUNTER — Other Ambulatory Visit: Payer: Self-pay | Admitting: *Deleted

## 2013-03-23 MED ORDER — METFORMIN HCL ER (OSM) 500 MG PO TB24: 500.0000 mg | ORAL_TABLET | Freq: Two times a day (BID) | ORAL | Status: DC

## 2013-03-28 ENCOUNTER — Encounter: Payer: Self-pay | Admitting: Internal Medicine

## 2013-04-15 ENCOUNTER — Other Ambulatory Visit: Payer: Self-pay | Admitting: *Deleted

## 2013-04-15 MED ORDER — INSULIN GLARGINE 100 UNIT/ML ~~LOC~~ SOLN: SUBCUTANEOUS | Status: DC

## 2013-04-17 ENCOUNTER — Other Ambulatory Visit: Payer: Self-pay | Admitting: Endocrinology

## 2013-04-18 ENCOUNTER — Other Ambulatory Visit: Payer: Self-pay | Admitting: *Deleted

## 2013-04-18 MED ORDER — INSULIN GLARGINE 100 UNIT/ML ~~LOC~~ SOLN: SUBCUTANEOUS | Status: DC

## 2013-04-25 ENCOUNTER — Ambulatory Visit: Payer: No Typology Code available for payment source | Admitting: Endocrinology

## 2013-04-25 DIAGNOSIS — Z0289 Encounter for other administrative examinations: Secondary | ICD-10-CM

## 2013-06-28 ENCOUNTER — Other Ambulatory Visit: Payer: Self-pay | Admitting: *Deleted

## 2013-07-06 ENCOUNTER — Other Ambulatory Visit: Payer: Self-pay | Admitting: *Deleted

## 2013-07-06 ENCOUNTER — Telehealth: Payer: Self-pay | Admitting: Endocrinology

## 2013-07-06 MED ORDER — INSULIN ASPART 100 UNIT/ML FLEXPEN: 20.0000 [IU] | PEN_INJECTOR | Freq: Three times a day (TID) | SUBCUTANEOUS | Status: DC

## 2013-07-06 NOTE — Telephone Encounter (Signed)
Pt requesting you call for the medication assistance for his novolog so he can get it

## 2013-07-06 NOTE — Telephone Encounter (Signed)
rx sent to health department for Novolog

## 2013-07-06 NOTE — Telephone Encounter (Signed)
rx for novolog sent to the health department

## 2013-07-11 ENCOUNTER — Other Ambulatory Visit: Payer: Self-pay | Admitting: Endocrinology

## 2013-07-11 ENCOUNTER — Encounter: Payer: Self-pay | Admitting: Endocrinology

## 2013-07-11 ENCOUNTER — Ambulatory Visit (INDEPENDENT_AMBULATORY_CARE_PROVIDER_SITE_OTHER): Payer: No Typology Code available for payment source | Admitting: Endocrinology

## 2013-07-11 VITALS — BP 130/84 | HR 76 | Temp 98.3°F | Resp 16 | Ht 65.25 in | Wt 192.2 lb

## 2013-07-11 DIAGNOSIS — E1165 Type 2 diabetes mellitus with hyperglycemia: Principal | ICD-10-CM

## 2013-07-11 DIAGNOSIS — R200 Anesthesia of skin: Secondary | ICD-10-CM

## 2013-07-11 DIAGNOSIS — R209 Unspecified disturbances of skin sensation: Secondary | ICD-10-CM

## 2013-07-11 DIAGNOSIS — E7800 Pure hypercholesterolemia, unspecified: Secondary | ICD-10-CM

## 2013-07-11 DIAGNOSIS — I10 Essential (primary) hypertension: Secondary | ICD-10-CM

## 2013-07-11 DIAGNOSIS — IMO0001 Reserved for inherently not codable concepts without codable children: Secondary | ICD-10-CM

## 2013-07-11 MED ORDER — CANAGLIFLOZIN 100 MG PO TABS: 100.0000 mg | ORAL_TABLET | Freq: Every day | ORAL | Status: DC

## 2013-07-11 NOTE — Progress Notes (Signed)
Patient ID: Austin Norris, male   DOB: 1956/03/29, 58 y.o.   MRN: [id number]   Reason for Appointment: Diabetes follow-up   History of Present Illness   Diagnosis: Type 2 DIABETES MELITUS, long-standing     He has been on insulin to treat his diabetes for several years partly because of failure of oral agents and also for reducing cost of medications He is generally checking his blood sugars once or twice a day but does not keep a record Overall has had somewhat poor control Usually does not keep a record of his glucose readings and difficult to review his blood sugar patterns He is  checking his blood sugars mostly in the mornings and occasionally at bedtime Because of his inadequate control he was given Invokana samples which she thinks was helping but could not get the prescription because of some insurance difficulty He thinks that his blood sugars may be higher from eating sweets at times including last night. He will take some insulin at breakfast on the days he is not working and blood sugar is fairly good today He also does not like to take any mealtime coverage for his lunch because he is at work and is walking more.  Usually does not check readings after lunch.  Oral hypoglycemic drugs: Metformin 1g at dinner       Side effects from medications: None Insulin regimen: Lantus usually 48-50 units at bedtime, NovoLog 20-25 before supper, Novolog in am at times        Proper timing of medications in relation to meals: Yes.         Monitors blood glucose: Once a day.    Glucometer:  [employer] brand         Blood Glucose readings from meter download: readings before breakfast: 95, 146, 180 280, 274, 164 pcs; average about 170 for 30 days Hypoglycemia frequency:  none recently.           Meals: 1-2 meals per day, usually high carbohydrate in the morning, sweets in pm, fried food occasionally         Physical activity: exercise: Mostly walking at work, is active in the parking lot of his car  [place] visit: Most recent: None             Wt Readings from Last 3 Encounters:  07/11/13 192 lb 3.2 oz (87.181 kg)  03/21/13 193 lb (87.544 kg)  03/16/13 193 lb 12.8 oz (87.907 kg)   LABS:  Lab Results  Component Value Date   HGBA1C 7.9* 03/11/2013   HGBA1C 8.3* 12/10/2012   Lab Results  Component Value Date   MICROALBUR 0.2 03/11/2013   LDLCALC 114* 03/11/2013   CREATININE 1.0 03/11/2013        Medication List       This list is accurate as of: 07/11/13 11:19 AM.  Always use your most recent med list.               amLODipine 2.5 MG tablet  Commonly known as:  NORVASC  TAKE 1 TABLET EVERY DAY     atorvastatin 20 MG tablet  Commonly known as:  LIPITOR  Take 1 tablet (20 mg total) by mouth daily.     Canagliflozin 100 MG Tabs  Commonly known as:  INVOKANA  Take 1 tablet (100 mg total) by mouth daily before breakfast.     glucose blood test strip  Commonly  known as:  FREESTYLE TEST STRIPS  Use as instructed to check blood sugars once a day     insulin aspart 100 UNIT/ML FlexPen  Commonly known as:  NOVOLOG FLEXPEN  Inject 20 Units into the skin 3 (three) times daily with meals.     insulin glargine 100 UNIT/ML injection  Commonly known as:  LANTUS  Inject 50 units at bedtime     metformin 500 MG (OSM) 24 hr tablet  Commonly known as:  FORTAMET  Take 1 tablet (500 mg total) by mouth 2 (two) times daily with a meal.     quinapril 40 MG tablet  Commonly known as:  ACCUPRIL  Take 1 tablet (40 mg total) by mouth at bedtime.        Allergies: No Known Allergies  Past Medical History  Diagnosis Date  . Diabetes mellitus   . Hypertension   . Hypercholesteremia     Past Surgical History  Procedure Laterality Date  . Cardiac catheterization      Family History  Problem Relation Age of Onset  . Heart disease Mother   . Diabetes Father   . Diabetes Sister    No colon cancer  Social History:  reports that he quit  smoking about 16 years ago. He has never used smokeless tobacco. He reports that he does not drink alcohol or use illicit drugs.  Review of Systems:  HYPERTENSION:  he has had long-standing hypertension, he thinks his systolic readings are 283-151 Is taking low dose amlodipine  along with his Accupril and blood pressure appears to be well controlled  HYPERLIPIDEMIA: The lipid abnormality consists of elevated LDL. Previously had been irregular with taking his Lipitor because of fear of side effects.He is reportedly taking his Lipitor regularly since his last visit  Has had some evidence of CAD in the past but currently asymptomatic  Lab Results  Component Value Date   CHOL 191 03/11/2013   HDL 42.30 03/11/2013   LDLCALC 114* 03/11/2013   TRIG 173.0* 03/11/2013   CHOLHDL 5 03/11/2013    No history of numbness or tingling in his feet but has some occasional sharp pains. Also has occasional numbness in one of his left hand fingers  Recent colonoscopy normal, was seen by gastroenterologist also for GI symptoms      Examination:   BP 130/84  Pulse 76  Temp(Src) 98.3 F (36.8 C)  Resp 16  Ht 5' 5.25" (1.657 m)  Wt 192 lb 3.2 oz (87.181 kg)  BMI 31.75 kg/m2  SpO2 96%  Body mass index is 31.75 kg/(m^2).   No pedal edema  ASSESSMENT/ PLAN:   Diabetes type 2   The patient's diabetes control appears to be  still inadequate with relatively high home readings and recent average of 170 He is not compliant with his mealtime insulin because of fear of hypoglycemia when he is working Also does not watch his diet especially in the evening with sweets of fried food; also in the morning has unbalanced meals with carbohydrate only He agrees to try it Invokana again which was prescribed previously and will use the co-pay card May need to reduce his insulin if blood sugars continue to improve Discussed cutting back on high fat and high carbohydrate meals and getting more protein at  breakfast He was also started using a FreeStyle monitor which was given to him Reminded him to check more readings after meals and bring monitor for review  HYPERTENSION: His blood pressure is  well-controlled  Hyperlipidemia: Lipids will need to be checked they were not controlled on his last visit because of noncompliance  Counseling time over 50% of today's 25 minute visit  Daegon Deiss 07/11/2013, 11:19 AM

## 2013-07-11 NOTE — Patient Instructions (Signed)
Please check blood sugars at least half the time about 2 hours after any meal and as directed on waking up. Please bring blood sugar monitor to each visit  Less starch, fried food; more protein at Brkfst  Take 10 units for sandwich at lunch

## 2013-07-12 ENCOUNTER — Telehealth: Payer: Self-pay | Admitting: Endocrinology

## 2013-07-12 LAB — LIPID PANEL
Cholesterol: 193 mg/dL (ref 0–200)
HDL: 53 mg/dL (ref >39)
LDL Cholesterol: 108 mg/dL — ABNORMAL HIGH (ref 0–99)
Total CHOL/HDL Ratio: 3.6 Ratio
Triglycerides: 162 mg/dL — ABNORMAL HIGH (ref <150)
VLDL: 32 mg/dL (ref 0–40)

## 2013-07-12 LAB — HEMOGLOBIN A1C
Hgb A1c MFr Bld: 7.9 % — ABNORMAL HIGH (ref <5.7)
Mean Plasma Glucose: 180 mg/dL — ABNORMAL HIGH (ref <117)

## 2013-07-12 NOTE — Telephone Encounter (Signed)
Please call lipitor into the [place]

## 2013-07-13 ENCOUNTER — Other Ambulatory Visit: Payer: Self-pay | Admitting: *Deleted

## 2013-07-13 MED ORDER — ATORVASTATIN CALCIUM 20 MG PO TABS: 20.0000 mg | ORAL_TABLET | Freq: Every day | ORAL | Status: DC

## 2013-07-13 NOTE — Telephone Encounter (Signed)
rx sent to the health department

## 2013-08-14 ENCOUNTER — Other Ambulatory Visit: Payer: Self-pay | Admitting: Endocrinology

## 2013-08-16 ENCOUNTER — Other Ambulatory Visit: Payer: Self-pay | Admitting: *Deleted

## 2013-08-16 MED ORDER — INSULIN GLARGINE 100 UNIT/ML ~~LOC~~ SOLN: SUBCUTANEOUS | Status: DC

## 2013-08-16 MED ORDER — INSULIN ASPART 100 UNIT/ML FLEXPEN: 20.0000 [IU] | PEN_INJECTOR | Freq: Three times a day (TID) | SUBCUTANEOUS | Status: DC

## 2013-08-24 ENCOUNTER — Other Ambulatory Visit: Payer: Self-pay | Admitting: *Deleted

## 2013-08-24 MED ORDER — INSULIN GLARGINE 100 UNIT/ML SOLOSTAR PEN: 50.0000 [IU] | PEN_INJECTOR | Freq: Every day | SUBCUTANEOUS | Status: DC

## 2013-08-24 MED ORDER — INSULIN ASPART 100 UNIT/ML FLEXPEN: 20.0000 [IU] | PEN_INJECTOR | Freq: Three times a day (TID) | SUBCUTANEOUS | Status: DC

## 2013-09-16 ENCOUNTER — Other Ambulatory Visit: Payer: Self-pay | Admitting: Endocrinology

## 2013-09-18 ENCOUNTER — Other Ambulatory Visit: Payer: Self-pay | Admitting: Endocrinology

## 2013-11-22 ENCOUNTER — Telehealth: Payer: Self-pay

## 2013-11-22 NOTE — Telephone Encounter (Signed)
Diabetic Bundle. Called and left message with family member for pt to call back and schedule appointment with Endocrinologist, Dr. Dwyane Dee.

## 2013-11-24 ENCOUNTER — Telehealth: Payer: Self-pay | Admitting: Endocrinology

## 2013-11-24 NOTE — Telephone Encounter (Signed)
Patient needs the refill on all the meds Dr. Dwyane Dee rx'd.  Rhonda call pt for which meds need to be called where

## 2013-11-24 NOTE — Telephone Encounter (Signed)
Patient has to be seen before refills given, he was told that he would not be given refills again until he was seen in office

## 2013-11-29 ENCOUNTER — Ambulatory Visit: Payer: No Typology Code available for payment source | Admitting: Endocrinology

## 2013-11-29 DIAGNOSIS — Z0289 Encounter for other administrative examinations: Secondary | ICD-10-CM

## 2013-12-13 ENCOUNTER — Telehealth: Payer: Self-pay | Admitting: *Deleted

## 2013-12-13 NOTE — Telephone Encounter (Signed)
Left message for patient to call and schedule appt

## 2013-12-20 ENCOUNTER — Other Ambulatory Visit: Payer: Self-pay | Admitting: Endocrinology

## 2013-12-22 ENCOUNTER — Other Ambulatory Visit: Payer: Self-pay | Admitting: Endocrinology

## 2013-12-29 ENCOUNTER — Ambulatory Visit (INDEPENDENT_AMBULATORY_CARE_PROVIDER_SITE_OTHER): Payer: No Typology Code available for payment source | Admitting: Endocrinology

## 2013-12-29 ENCOUNTER — Encounter: Payer: Self-pay | Admitting: Endocrinology

## 2013-12-29 VITALS — BP 164/76 | HR 70 | Temp 97.8°F | Resp 14 | Ht 65.25 in | Wt 185.8 lb

## 2013-12-29 DIAGNOSIS — I10 Essential (primary) hypertension: Secondary | ICD-10-CM

## 2013-12-29 DIAGNOSIS — E1165 Type 2 diabetes mellitus with hyperglycemia: Principal | ICD-10-CM

## 2013-12-29 DIAGNOSIS — E7800 Pure hypercholesterolemia, unspecified: Secondary | ICD-10-CM | POA: Diagnosis not present

## 2013-12-29 DIAGNOSIS — IMO0001 Reserved for inherently not codable concepts without codable children: Secondary | ICD-10-CM

## 2013-12-29 LAB — COMPREHENSIVE METABOLIC PANEL
ALBUMIN: 4.2 g/dL (ref 3.5–5.2)
ALK PHOS: 79 U/L (ref 39–117)
ALT: 15 U/L (ref 0–53)
AST: 22 U/L (ref 0–37)
BUN: 11 mg/dL (ref 6–23)
CO2: 25 mEq/L (ref 19–32)
Calcium: 9.2 mg/dL (ref 8.4–10.5)
Chloride: 103 mEq/L (ref 96–112)
Creatinine, Ser: 0.9 mg/dL (ref 0.4–1.5)
GFR: 117.11 mL/min (ref >60.00)
Glucose, Bld: 122 mg/dL — ABNORMAL HIGH (ref 70–99)
POTASSIUM: 3.7 meq/L (ref 3.5–5.1)
SODIUM: 137 meq/L (ref 135–145)
TOTAL PROTEIN: 7.6 g/dL (ref 6.0–8.3)
Total Bilirubin: 0.4 mg/dL (ref 0.2–1.2)

## 2013-12-29 LAB — LIPID PANEL
CHOL/HDL RATIO: 3
Cholesterol: 138 mg/dL (ref 0–200)
HDL: 44.3 mg/dL (ref >39.00)
LDL Cholesterol: 79 mg/dL (ref 0–99)
NONHDL: 93.7
Triglycerides: 74 mg/dL (ref 0.0–149.0)
VLDL: 14.8 mg/dL (ref 0.0–40.0)

## 2013-12-29 LAB — HEMOGLOBIN A1C: HEMOGLOBIN A1C: 7.8 % — AB (ref 4.6–6.5)

## 2013-12-29 LAB — LDL CHOLESTEROL, DIRECT: Direct LDL: 89.8 mg/dL

## 2013-12-29 MED ORDER — AMLODIPINE BESYLATE 5 MG PO TABS: 5.0000 mg | ORAL_TABLET | Freq: Every day | ORAL | Status: DC

## 2013-12-29 NOTE — Progress Notes (Signed)
Quick Note:  A1c still high at 7.8, cholesterol again, remind him to take 3 metformin daily ______

## 2013-12-29 NOTE — Progress Notes (Signed)
Patient ID: Austin Norris, male   DOB: 1955/10/04, 58 y.o.   MRN: [id number]   Reason for Appointment: Diabetes follow-up   History of Present Illness   Diagnosis: Type 2 DIABETES MELITUS, long-standing     He has been on insulin to treat his diabetes for several years partly because of failure of oral agents and also for reducing cost of medications He is generally checking his blood sugars once or twice a day but does not keep a record Overall has had somewhat poor control with A1c consistently over 7%  Recent history:  He was given a FreeStyle monitor on the last visit but he has not used it  Still using his [employer] brand monitor and not keeping a record of his readings  He thinks he is checking his blood sugar twice a day but his meter shows only 26 readings in the last 30 days Also recently checking blood sugar mostly at bedtime Fasting readings: Relatively good, once 69 and he drinks occasionally may have hypoglycemic symptoms during the night despite not taking any NovoLog at bedtime  Postprandial readings: He is checking her blood sugar late at night, not sure how long after dinner but most of his readings are relatively high and his overall average is also high. Probably has a relatively high carbohydrate intake and some sweets Despite high at bedtime readings he is not going up on his NovoLog dose He also has not been taking his Invokana for about 2 months which he thinks has helped his sugars in the past  He will take some NovoLog insulin at breakfast on the days he is not working, usually not eating protein at breakfast He also does not like to take any mealtime coverage for his lunch because he is at work and is walking more.  Usually does not check readings after lunch or before supper.  Oral hypoglycemic drugs: Metformin 1g at dinner       Side effects from medications: None Insulin regimen: Lantus usually 48-50 units at bedtime, NovoLog 20-25 before supper, Novolog in am at  times        Proper timing of medications in relation to meals: Yes.         Monitors blood glucose: Once a day.    Glucometer:  [employer] brand         Blood Glucose readings from meter download: readings before breakfast:124, 69, 151 Pcs 194, 245, 163 14 day average is 174  Hypoglycemia frequency:  occasionally overnight.           Meals: 1-2 meals per day, usually high carbohydrate in the morning, sweets in pm, fried food occasionally         Physical activity: exercise: Mostly walking at work, is active in the parking lot of his car [place] visit: Most recent: None             Wt Readings from Last 3 Encounters:  12/29/13 185 lb 12.8 oz (84.278 kg)  07/11/13 192 lb 3.2 oz (87.181 kg)  03/21/13 193 lb (87.544 kg)   LABS:  Lab Results  Component Value Date   HGBA1C 7.9* 07/11/2013   HGBA1C 7.9* 03/11/2013   HGBA1C 8.3* 12/10/2012   Lab Results  Component Value Date   MICROALBUR 0.2 03/11/2013   LDLCALC 108* 07/11/2013   CREATININE 1.0 03/11/2013        Medication List  This list is accurate as of: 12/29/13  1:44 PM.  Always use your most recent med list.               amLODipine 2.5 MG tablet  Commonly known as:  NORVASC  TAKE 1 TABLET BY MOUTH EVERY DAY     atorvastatin 20 MG tablet  Commonly known as:  LIPITOR  TAKE 1 TABLET EVERY DAY     glucose blood test strip  Commonly known as:  FREESTYLE TEST STRIPS  Use as instructed to check blood sugars once a day     insulin aspart 100 UNIT/ML FlexPen  Commonly known as:  NOVOLOG FLEXPEN  Inject 20 Units into the skin 3 (three) times daily with meals.     Insulin Glargine 100 UNIT/ML Solostar Pen  Commonly known as:  LANTUS SOLOSTAR  Inject 50 Units into the skin daily at 10 pm.     INVOKANA 100 MG Tabs  Generic drug:  Canagliflozin  TAKE 1 TABLET (100 MG TOTAL) BY MOUTH DAILY BEFORE BREAKFAST.     metFORMIN 500 MG 24 hr tablet  Commonly known as:  GLUCOPHAGE-XR  TAKE 1 TABLET  (500 MG TOTAL) BY MOUTH 2 (TWO) TIMES DAILY WITH A MEAL.     quinapril 40 MG tablet  Commonly known as:  ACCUPRIL  TAKE 1 TABLET (40 MG TOTAL) BY MOUTH AT BEDTIME.        Allergies: No Known Allergies  Past Medical History  Diagnosis Date  . Diabetes mellitus   . Hypertension   . Hypercholesteremia     Past Surgical History  Procedure Laterality Date  . Cardiac catheterization      Family History  Problem Relation Age of Onset  . Heart disease Mother   . Diabetes Father   . Diabetes Sister    No colon cancer  Social History:  reports that he quit smoking about 16 years ago. He has never used smokeless tobacco. He reports that he does not drink alcohol or use illicit drugs.  Review of Systems:  HYPERTENSION:  he has had long-standing hypertension, he thinks his systolic readings with home wrist meter are better in the morning but higher around  140-150 at night Is taking low dose amlodipine  along with his Accupril ? 20 mg Not taking her diuretic, is on Invokana  HYPERLIPIDEMIA: The lipid abnormality consists of elevated LDL. Previously had been irregular with taking his Lipitor because of fear of side effects.He is reportedly taking his Lipitor regularly and the dose was increased to 20 mg when LDL was 108  Has had some evidence of CAD in the past but currently asymptomatic  Lab Results  Component Value Date   CHOL 193 07/11/2013   HDL 53 07/11/2013   LDLCALC 108* 07/11/2013   TRIG 162* 07/11/2013   CHOLHDL 3.6 07/11/2013    No history of numbness or tingling in his feet but has some occasional sharp pains.        Examination:   BP 162/90  Pulse 70  Temp(Src) 97.8 F (36.6 C)  Resp 14  Ht 5' 5.25" (1.657 m)  Wt 185 lb 12.8 oz (84.278 kg)  BMI 30.70 kg/m2  SpO2 96%  Body mass index is 30.7 kg/(m^2).   No pedal edema  ASSESSMENT/ PLAN:   Diabetes type 2   The patient's diabetes control appears to be  still inadequate with relatively high home readings  especially after meals Difficult to assess his control as he is checking blood  sugars sporadically and mostly at night He also has not been taking his Invokana consistently which he thinks has helped his sugars in the past He is not compliant with his mealtime insulin at breakfast and lunch because of fear of hypoglycemia when he is working Also does not watch his diet consistently in evening and has not lost any weight He has just started using Invokana again in blood sugar may be better He will restart using a FreeStyle monitor which was given to him previously Reminded him to check more readings during the day time and also fasting and bring FreeStyle monitor for review Insulin changes: He agrees to try taking Lantus in the morning for better daytime blood sugar control in the morning nighttime hypoglycemia May consider using Toujeo Consider using more mealtime insulin if postprandial readings are high Discussed reducing his basal insulin to about 40 units when he is walking 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 Hemoglobin A1C 12/29/2013 7.8* 4.6 - 6.5 % Final   Glycemic Control Guidelines for People with Diabetes:Non Diabetic:  <6%Goal of Therapy: <7%Additional Action Suggested:  >8%   . Sodium 12/29/2013 137  135 - 145 mEq/L Final  . Potassium 12/29/2013 3.7  3.5 - 5.1 mEq/L Final  . Chloride 12/29/2013 103  96 - 112 mEq/L Final  . CO2 12/29/2013 25  19 - 32 mEq/L Final  . Glucose, Bld 12/29/2013 122* 70 - 99 mg/dL Final  . BUN  12/29/2013 11  6 - 23 mg/dL Final  . Creatinine, Ser 12/29/2013 0.9  0.4 - 1.5 mg/dL Final  . Total Bilirubin 12/29/2013 0.4  0.2 - 1.2 mg/dL Final  . Alkaline Phosphatase 12/29/2013 79  39 - 117 U/L Final  . AST 12/29/2013 22  0 - 37 U/L Final  . ALT 12/29/2013 15  0 - 53 U/L Final  . Total Protein 12/29/2013 7.6  6.0 - 8.3 g/dL Final  . Albumin 12/29/2013 4.2  3.5 - 5.2 g/dL Final  . Calcium 12/29/2013 9.2  8.4 - 10.5 mg/dL Final  . GFR 12/29/2013 117.11  >60.00 mL/min Final  . Direct LDL 12/29/2013 89.8   Final   Optimal:  <100 mg/dLNear or Above Optimal:  100-129 mg/dLBorderline High:  130-159 mg/dLHigh:  160-189 mg/dLVery High:  >190 mg/dL  . Cholesterol 12/29/2013 138  0 - 200 mg/dL Final   ATP III Classification       Desirable:  < 200 mg/dL               Borderline High:  200 - 239 mg/dL          High:  > = 240 mg/dL  . Triglycerides 12/29/2013 74.0  0.0 - 149.0 mg/dL Final   Normal:  <150 mg/dLBorderline High:  150 - 199 mg/dL  . HDL 12/29/2013 44.30  >39.00 mg/dL Final  . VLDL 12/29/2013 14.8  0.0 - 40.0 mg/dL Final  . LDL Cholesterol 12/29/2013 79  0 - 99 mg/dL Final  . Total CHOL/HDL Ratio 12/29/2013 3   Final  Men          Women1/2 Average Risk     3.4          3.3Average Risk          5.0          4.42X Average Risk          9.6          7.13X Average Risk          15.0          11.0                      . NonHDL 12/29/2013 93.70   Final   NOTE:  Non-HDL goal should be 30 mg/dL higher than patient's LDL goal (i.e. LDL goal of < 70 mg/dL, would have non-HDL goal of < 100 mg/dL)

## 2013-12-29 NOTE — Patient Instructions (Addendum)
Increase amlodipine to 5mg  in am  Please check blood sugars at least half the time about 2 hours after any meal including lunch or dinner and 3 times per week on waking up.  Please bring blood sugar monitor to each visit  Try Lantus 48 in am instead of night  Extra 3-5 Novolog at dinner from what your dose is now

## 2014-01-03 ENCOUNTER — Ambulatory Visit: Payer: No Typology Code available for payment source

## 2014-01-04 ENCOUNTER — Telehealth: Payer: Self-pay | Admitting: Endocrinology

## 2014-01-04 ENCOUNTER — Other Ambulatory Visit: Payer: Self-pay | Admitting: *Deleted

## 2014-01-04 MED ORDER — INSULIN ASPART 100 UNIT/ML FLEXPEN: 20.0000 [IU] | PEN_INJECTOR | Freq: Three times a day (TID) | SUBCUTANEOUS | Status: DC

## 2014-01-04 MED ORDER — INSULIN GLARGINE 100 UNIT/ML SOLOSTAR PEN: 50.0000 [IU] | PEN_INJECTOR | Freq: Every day | SUBCUTANEOUS | Status: DC

## 2014-01-04 NOTE — Telephone Encounter (Signed)
rx sent

## 2014-01-04 NOTE — Telephone Encounter (Signed)
Patient need refill of Lantus  solostar 100 units, [facility] to [facility] our [facility] to send prescription.

## 2014-01-05 LAB — HM DIABETES EYE EXAM

## 2014-01-08 ENCOUNTER — Other Ambulatory Visit: Payer: Self-pay | Admitting: Family Medicine

## 2014-01-09 ENCOUNTER — Telehealth: Payer: Self-pay | Admitting: Endocrinology

## 2014-01-09 ENCOUNTER — Other Ambulatory Visit: Payer: Self-pay | Admitting: *Deleted

## 2014-01-09 MED ORDER — INSULIN GLARGINE 100 UNIT/ML ~~LOC~~ SOLN: 50.0000 [IU] | Freq: Every day | SUBCUTANEOUS | Status: DC

## 2014-01-09 MED ORDER — "INSULIN SYRINGE 31G X 5/16"" 1 ML MISC": Status: DC

## 2014-01-09 NOTE — Telephone Encounter (Signed)
Pt needs insulin called in to the patient assistance program [phone]

## 2014-01-10 ENCOUNTER — Telehealth: Payer: Self-pay | Admitting: Endocrinology

## 2014-01-10 NOTE — Telephone Encounter (Signed)
Patient is returning your call.  

## 2014-01-13 ENCOUNTER — Other Ambulatory Visit: Payer: Self-pay | Admitting: Endocrinology

## 2014-01-24 ENCOUNTER — Encounter: Payer: Self-pay | Admitting: *Deleted

## 2014-02-26 ENCOUNTER — Other Ambulatory Visit: Payer: Self-pay | Admitting: Endocrinology

## 2014-03-01 ENCOUNTER — Other Ambulatory Visit: Payer: Self-pay | Admitting: Endocrinology

## 2014-03-04 ENCOUNTER — Other Ambulatory Visit: Payer: Self-pay | Admitting: Endocrinology

## 2014-03-19 ENCOUNTER — Other Ambulatory Visit: Payer: Self-pay | Admitting: Endocrinology

## 2014-03-21 ENCOUNTER — Other Ambulatory Visit: Payer: Self-pay | Admitting: Endocrinology

## 2014-03-30 ENCOUNTER — Other Ambulatory Visit: Payer: Self-pay | Admitting: Endocrinology

## 2014-04-01 ENCOUNTER — Other Ambulatory Visit: Payer: Self-pay | Admitting: Endocrinology

## 2014-04-03 ENCOUNTER — Telehealth: Payer: Self-pay | Admitting: Endocrinology

## 2014-04-03 NOTE — Telephone Encounter (Signed)
Patient has been told on several occasion he needs to be seen before more refills given, unable to give him an rx until he's seen in office

## 2014-04-03 NOTE — Telephone Encounter (Signed)
Patient need med to last him to his next appt. Please advise

## 2014-04-10 ENCOUNTER — Telehealth: Payer: Self-pay | Admitting: Endocrinology

## 2014-04-10 ENCOUNTER — Other Ambulatory Visit: Payer: Self-pay | Admitting: *Deleted

## 2014-04-10 MED ORDER — CANAGLIFLOZIN 100 MG PO TABS: ORAL_TABLET | ORAL | Status: DC

## 2014-04-10 NOTE — Telephone Encounter (Signed)
rx sent for 30 day supply only

## 2014-04-10 NOTE — Telephone Encounter (Signed)
Pt needs invokana called in asap he has been out all weekend. Please call to [facility]

## 2014-04-15 ENCOUNTER — Other Ambulatory Visit: Payer: Self-pay | Admitting: Endocrinology

## 2014-05-01 ENCOUNTER — Ambulatory Visit (INDEPENDENT_AMBULATORY_CARE_PROVIDER_SITE_OTHER): Payer: No Typology Code available for payment source | Admitting: Endocrinology

## 2014-05-01 ENCOUNTER — Encounter: Payer: Self-pay | Admitting: Endocrinology

## 2014-05-01 VITALS — BP 148/78 | HR 61 | Temp 98.2°F | Resp 14 | Ht 65.25 in | Wt 184.6 lb

## 2014-05-01 DIAGNOSIS — E7800 Pure hypercholesterolemia, unspecified: Secondary | ICD-10-CM

## 2014-05-01 DIAGNOSIS — IMO0002 Reserved for concepts with insufficient information to code with codable children: Secondary | ICD-10-CM

## 2014-05-01 DIAGNOSIS — I10 Essential (primary) hypertension: Secondary | ICD-10-CM

## 2014-05-01 DIAGNOSIS — E1165 Type 2 diabetes mellitus with hyperglycemia: Secondary | ICD-10-CM

## 2014-05-01 LAB — GLUCOSE, POCT (MANUAL RESULT ENTRY): POC Glucose: 76 mg/dl (ref 70–99)

## 2014-05-01 NOTE — Patient Instructions (Signed)
Please check blood sugars at least half the time about 2 hours after any meal and times per week on waking up. Please bring blood sugar monitor to each visit  Take Lantus at dinner with Novolog and try 46 units, and if am sugar > 140 then try 48  Must have protein in am daily  Invokana before Breakfast

## 2014-05-01 NOTE — Progress Notes (Signed)
Patient ID: Austin Norris, male   DOB: 03-09-1956, 59 y.o.   MRN: [id number]   Reason for Appointment: Diabetes follow-up   History of Present Illness   Diagnosis: Type 2 DIABETES MELITUS, long-standing     He has been on insulin to treat his diabetes for several years partly because of failure of oral agents and also for reducing cost of medications He is generally checking his blood sugars once or twice a day but does not keep a record Overall has had somewhat poor control with A1c consistently over 7%  Recent history:  He was given a FreeStyle monitor and he has used it but recently because of difficulty getting the discount on the co-pay card he has not used it In the last few days has been using his [employer] brand monitor and not keeping a record of his readings  Although he claims his blood sugars have always been good he was having high readings after supper on the last visit Previous A1c was nearly 8% again Not clear if he benefited from starting [name] in early 2015 although previously was taking it irregularly Fasting readings: Relatively good and he does not think they are variable Postprandial readings: He is checking his blood sugar late at night to decide on his Lantus dose but not checking readings 2 hours after meals as directed.  He does not think they are as high He now says that he is usually not eating lunch Hypoglycemia: He feels that his sugar may get low if he does not skip the Lantus at bedtime when his blood sugars about 100 or 110 Today he felt a little dizzy in the late afternoon sugar was only 76.  Oral hypoglycemic drugs: Metformin 1g at dinner       Side effects from medications: None Insulin regimen: Lantus usually 50 units at bedtime, NovoLog 20-25 before supper, Novolog in am 10-15      Proper timing of medications in relation to meals: Yes.          Monitors blood glucose: Once a day.    Glucometer:  Freestyle or [employer] brand         Blood Glucose readings  from recall: readings before breakfast:115-120 Pcs  130-160         Meals: 1-2 meals per day, usually high carbohydrate with bread and milk in the morning, sometimes sweets in pm, fried food occasionally         Physical activity: exercise: Mostly walking at work, is active in the parking lot of his car [place] visit: Most recent: None             Wt Readings from Last 3 Encounters:  05/01/14 184 lb 9.6 oz (83.734 kg)  12/29/13 185 lb 12.8 oz (84.278 kg)  07/11/13 192 lb 3.2 oz (87.181 kg)   LABS:  Lab Results  Component Value Date   HGBA1C 7.8* 12/29/2013   HGBA1C 7.9* 07/11/2013   HGBA1C 7.9* 03/11/2013   Lab Results  Component Value Date   MICROALBUR 0.2 03/11/2013   LDLCALC 79 12/29/2013   CREATININE 0.9 12/29/2013        Medication List       This list is accurate as of: 05/01/14  4:08 PM.  Always use your most recent med list.               amLODipine 5 MG tablet  Commonly known as:  NORVASC  TAKE 1 TABLET (5 MG TOTAL) BY MOUTH DAILY.     atorvastatin 20 MG tablet  Commonly known as:  LIPITOR  TAKE 1 TABLET EVERY DAY     canagliflozin 100 MG Tabs tablet  Commonly known as:  INVOKANA  TAKE 1 TABLET BY MOUTH EVERY DAY BEFORE BREAKFAST     glucose blood test strip  Commonly known as:  FREESTYLE TEST STRIPS  Use as instructed to check blood sugars once a day     FREESTYLE LITE test strip  Generic drug:  glucose blood  USE AS INSTRUCTED TO CHECK BLOOD SUGARS TWICE A DAY, DX CODE 250.00     insulin aspart 100 UNIT/ML FlexPen  Commonly known as:  NOVOLOG FLEXPEN  Inject 20 Units into the skin 3 (three) times daily with meals.     Insulin Glargine 100 UNIT/ML Solostar Pen  Commonly known as:  LANTUS SOLOSTAR  Inject 50 Units into the skin daily at 10 pm.     insulin glargine 100 UNIT/ML injection  Commonly known as:  LANTUS  Inject 0.5 mLs (50 Units total) into the skin at bedtime.     INSULIN SYRINGE 1CC/31GX5/16" 31G X  5/16" 1 ML Misc  Use to inject lantus daily     metFORMIN 500 MG 24 hr tablet  Commonly known as:  GLUCOPHAGE-XR  TAKE 1 TABLET (500 MG TOTAL) BY MOUTH 2 (TWO) TIMES DAILY WITH A MEAL.     metFORMIN 500 MG 24 hr tablet  Commonly known as:  GLUCOPHAGE-XR  TAKE 1 TABLET (500 MG TOTAL) BY MOUTH 2 (TWO) TIMES DAILY WITH A MEAL.     quinapril 40 MG tablet  Commonly known as:  ACCUPRIL  TAKE 1 TABLET BY MOUTH AT BEDTIME        Allergies: No Known Allergies  Past Medical History  Diagnosis Date  . Diabetes mellitus   . Hypertension   . Hypercholesteremia     Past Surgical History  Procedure Laterality Date  . Cardiac catheterization      Family History  Problem Relation Age of Onset  . Heart disease Mother   . Diabetes Father   . Diabetes Sister    No colon cancer  Social History:  reports that he quit smoking about 16 years ago. He has never used smokeless tobacco. He reports that he does not drink alcohol or use illicit drugs.  Review of Systems:  Last eye exam 9/15, normal   HYPERTENSION:  he has had long-standing hypertension, he thinks his systolic readings with home wrist meter have been  around 130-140 Is now taking 5 mg amlodipine  along with his Accupril   HYPERLIPIDEMIA: The lipid abnormality consists of elevated LDL. Previously had been irregular with taking his Lipitor because of fear of side effects. on his last visit he was taking his Lipitor regularly and the dose was increased to 20 mg previously  Has had some evidence of CAD in the past but currently asymptomatic  Lab Results  Component Value Date   CHOL 138 12/29/2013   HDL 44.30 12/29/2013   LDLCALC 79 12/29/2013   LDLDIRECT 89.8 12/29/2013   TRIG 74.0 12/29/2013   CHOLHDL 3 12/29/2013    No history of numbness or tingling in his feet but has some occasional sharp pains.   Nocturia, on unknown drug from another physician for the prostate, has not established with a PCP       Examination:   BP 126/82 mmHg  Pulse 61  Temp(Src) 98.2 F (36.8 C)  Resp 14  Ht 5' 5.25" (1.657 m)  Wt 184 lb 9.6 oz (83.734 kg)  BMI 30.50 kg/m2  SpO2 97%  Body mass index is 30.5 kg/(m^2).   No pedal edema  ASSESSMENT/ PLAN:   Diabetes type 2   The patient's diabetes control  needs to be assessed with A1c Difficult to assess his control as he is checking blood sugars irregularly and mostly at breakfast and bedtime and does not bring any records He does take his Invokana regularly and not clear if this has helped. He has not lost any weight since he started back on this Discussed timing of Lantus insulin and he can reduce the dose for now and take it at suppertime to avoid any peaks during the night  HYPERTENSION: His blood pressure is better controlled although inconsistent in the office today possibly from feeling hypoglycemic  Preventive care: He will establish with a PCP  Binnie Droessler 05/01/2014, 4:08 PM    No visits with results within 1 Day(s) from this visit. Latest known visit with results is:  Scanned Document on 02/01/2014  Component Date Value Ref Range Status  . HM Diabetic Eye Exam 01/05/2014 No Retinopathy  No Retinopathy Final

## 2014-05-02 ENCOUNTER — Other Ambulatory Visit: Payer: Self-pay | Admitting: Endocrinology

## 2014-05-02 ENCOUNTER — Other Ambulatory Visit: Payer: No Typology Code available for payment source

## 2014-05-03 ENCOUNTER — Other Ambulatory Visit (INDEPENDENT_AMBULATORY_CARE_PROVIDER_SITE_OTHER): Payer: No Typology Code available for payment source

## 2014-05-03 DIAGNOSIS — E7800 Pure hypercholesterolemia, unspecified: Secondary | ICD-10-CM

## 2014-05-03 DIAGNOSIS — E1165 Type 2 diabetes mellitus with hyperglycemia: Secondary | ICD-10-CM

## 2014-05-03 DIAGNOSIS — IMO0002 Reserved for concepts with insufficient information to code with codable children: Secondary | ICD-10-CM

## 2014-05-03 LAB — LIPID PANEL
Cholesterol: 165 mg/dL (ref 0–200)
HDL: 49.2 mg/dL (ref >39.00)
LDL Cholesterol: 88 mg/dL (ref 0–99)
NONHDL: 115.8
Total CHOL/HDL Ratio: 3
Triglycerides: 139 mg/dL (ref 0.0–149.0)
VLDL: 27.8 mg/dL (ref 0.0–40.0)

## 2014-05-03 LAB — HEMOGLOBIN A1C: Hgb A1c MFr Bld: 7.3 % — ABNORMAL HIGH (ref 4.6–6.5)

## 2014-05-04 ENCOUNTER — Other Ambulatory Visit: Payer: Self-pay | Admitting: Endocrinology

## 2014-05-04 LAB — BASIC METABOLIC PANEL
BUN: 13 mg/dL (ref 6–23)
CHLORIDE: 103 meq/L (ref 96–112)
CO2: 30 mEq/L (ref 19–32)
CREATININE: 0.9 mg/dL (ref 0.4–1.5)
Calcium: 9.4 mg/dL (ref 8.4–10.5)
GFR: 116.97 mL/min (ref >60.00)
Glucose, Bld: 44 mg/dL — CL (ref 70–99)
Potassium: 4 mEq/L (ref 3.5–5.1)
Sodium: 138 mEq/L (ref 135–145)

## 2014-05-05 NOTE — Progress Notes (Signed)
Quick Note:  Please let patient know that the A1c result is 7.3, improved; cholesterol okay; no further change needed ______

## 2014-05-10 ENCOUNTER — Other Ambulatory Visit: Payer: Self-pay | Admitting: Endocrinology

## 2014-05-12 ENCOUNTER — Other Ambulatory Visit: Payer: Self-pay | Admitting: Endocrinology

## 2014-06-27 ENCOUNTER — Other Ambulatory Visit: Payer: Self-pay | Admitting: *Deleted

## 2014-06-27 MED ORDER — INSULIN GLARGINE 100 UNIT/ML ~~LOC~~ SOLN: 50.0000 [IU] | Freq: Every day | SUBCUTANEOUS | Status: DC

## 2014-06-28 ENCOUNTER — Other Ambulatory Visit: Payer: Self-pay | Admitting: Endocrinology

## 2014-07-08 ENCOUNTER — Other Ambulatory Visit: Payer: Self-pay | Admitting: Endocrinology

## 2014-08-01 ENCOUNTER — Encounter (HOSPITAL_COMMUNITY): Payer: Self-pay

## 2014-08-01 ENCOUNTER — Emergency Department (HOSPITAL_COMMUNITY)
Admission: EM | Admit: 2014-08-01 | Discharge: 2014-08-01 | Disposition: A | Discharge status: Home / Self Care | Payer: No Typology Code available for payment source | Attending: Emergency Medicine | Admitting: Emergency Medicine

## 2014-08-01 DIAGNOSIS — J029 Acute pharyngitis, unspecified: Secondary | ICD-10-CM | POA: Diagnosis not present

## 2014-08-01 DIAGNOSIS — Z87891 Personal history of nicotine dependence: Secondary | ICD-10-CM | POA: Diagnosis not present

## 2014-08-01 DIAGNOSIS — R040 Epistaxis: Secondary | ICD-10-CM | POA: Diagnosis not present

## 2014-08-01 DIAGNOSIS — I10 Essential (primary) hypertension: Secondary | ICD-10-CM | POA: Diagnosis not present

## 2014-08-01 DIAGNOSIS — Z794 Long term (current) use of insulin: Secondary | ICD-10-CM | POA: Diagnosis not present

## 2014-08-01 DIAGNOSIS — E780 Pure hypercholesterolemia: Secondary | ICD-10-CM | POA: Insufficient documentation

## 2014-08-01 DIAGNOSIS — Z79899 Other long term (current) drug therapy: Secondary | ICD-10-CM | POA: Insufficient documentation

## 2014-08-01 DIAGNOSIS — E119 Type 2 diabetes mellitus without complications: Secondary | ICD-10-CM | POA: Diagnosis not present

## 2014-08-01 DIAGNOSIS — Z9889 Other specified postprocedural states: Secondary | ICD-10-CM | POA: Insufficient documentation

## 2014-08-01 MED ORDER — GI COCKTAIL ~~LOC~~
30.0000 mL | Freq: Once | ORAL | Status: AC
Start: 2014-08-01 — End: 2014-08-01
  Administered 2014-08-01: 30 mL via ORAL
  Filled 2014-08-01: qty 30

## 2014-08-01 MED ORDER — SILVER NITRATE-POT NITRATE 75-25 % EX MISC
1.0000 | Freq: Once | CUTANEOUS | Status: AC
  Administered 2014-08-01: 1 via TOPICAL
  Filled 2014-08-01: qty 1

## 2014-08-01 NOTE — ED Notes (Signed)
Bed: LD35 Expected date:  Expected time:  Means of arrival:  Comments: Save for triage 2

## 2014-08-01 NOTE — ED Notes (Signed)
PA at bedside.

## 2014-08-01 NOTE — ED Notes (Signed)
Pt ambulating independently w/ steady gait on d/c in no acute distress, A&Ox4.D/c instructions reviewed w/ pt and family - pt and family deny any further questions or concerns at present.  

## 2014-08-01 NOTE — ED Provider Notes (Signed)
CSN: [id number]     Arrival date & time 08/01/14  1902 History   First MD Initiated Contact with Patient 08/01/14 1925     Chief Complaint  Patient presents with  . Epistaxis     (Consider location/radiation/quality/duration/timing/severity/associated sxs/prior Treatment) Patient is a 59 y.o. male presenting with nosebleeds. The history is provided by the patient.  Epistaxis Location:  Bilateral Severity:  Mild Duration:  3 days Timing:  Intermittent Progression:  Unchanged Chronicity:  New Context: weather change (became very cold and windy also)   Context: not anticoagulants and not aspirin use   Relieved by:  Nothing Worsened by:  Nothing tried Associated symptoms: sore throat   Associated symptoms: no cough and no fever     Past Medical History  Diagnosis Date  . Diabetes mellitus   . Hypertension   . Hypercholesteremia    Past Surgical History  Procedure Laterality Date  . Cardiac catheterization     Family History  Problem Relation Age of Onset  . Heart disease Mother   . Diabetes Father   . Diabetes Sister    History  Substance Use Topics  . Smoking status: Former Smoker    Quit date: 05/21/1997  . Smokeless tobacco: Never Used  . Alcohol Use: No    Review of Systems  Constitutional: Negative for fever and chills.  HENT: Positive for nosebleeds and sore throat.   Respiratory: Negative for cough and shortness of breath.   All other systems reviewed and are negative.     Allergies  Review of patient's allergies indicates no known allergies.  Home Medications   Prior to Admission medications   Medication Sig Start Date End Date Taking? Authorizing Provider  amLODipine (NORVASC) 5 MG tablet TAKE 1 TABLET (5 MG TOTAL) BY MOUTH DAILY. Patient taking differently: Take 1 tablet (5mg  total) by mouth daily. 05/02/14  Yes Elayne Snare, MD  atorvastatin (LIPITOR) 20 MG tablet TAKE 1 TABLET EVERY DAY Patient taking differently: Take 1 tablet every day. 06/29/14   Yes Elayne Snare, MD  FREESTYLE LITE test strip USE AS INSTRUCTED TO CHECK BLOOD SUGARS TWICE A DAY, DX CODE 250.00 Patient taking differently: Use as instructed to check blood sugars twice a day, Dx code 250.00. 01/16/14  Yes Elayne Snare, MD  glucose blood (FREESTYLE TEST STRIPS) test strip Use as instructed to check blood sugars once a day 12/10/12  Yes Elayne Snare, MD  insulin aspart (NOVOLOG FLEXPEN) 100 UNIT/ML FlexPen Inject 20 Units into the skin 3 (three) times daily with meals. 01/04/14  Yes Elayne Snare, MD  Insulin Glargine (LANTUS SOLOSTAR) 100 UNIT/ML Solostar Pen Inject 50 Units into the skin daily at 10 pm. 01/04/14  Yes Elayne Snare, MD  Insulin Syringe-Needle U-100 (INSULIN SYRINGE 1CC/31GX5/16") 31G X 5/16" 1 ML MISC Use to inject lantus daily 01/09/14  Yes Elayne Snare, MD  INVOKANA 100 MG TABS tablet TAKE 1 TABLET BY MOUTH EVERY DAY BEFORE BREAKFAST Patient taking differently: Take 1 tablet by mouth every day before breakfast. 05/04/14  Yes Elayne Snare, MD  quinapril (ACCUPRIL) 40 MG tablet TAKE 1 TABLET BY MOUTH AT BEDTIME Patient taking differently: Take 1 tablet by mouth at bedtime. 05/12/14  Yes Elayne Snare, MD  amLODipine (NORVASC) 5 MG tablet TAKE 1 TABLET (5 MG TOTAL) BY MOUTH DAILY. Patient not taking: Reported on 08/01/2014 05/11/14   Elayne Snare, MD  amLODipine (NORVASC) 5 MG tablet TAKE 1 TABLET (5 MG TOTAL) BY MOUTH DAILY. Patient not taking: Reported on 08/01/2014 05/15/14  Elayne Snare, MD  atorvastatin (LIPITOR) 20 MG tablet TAKE 1 TABLET EVERY DAY Patient not taking: Reported on 08/01/2014 07/10/14   Elayne Snare, MD  insulin glargine (LANTUS) 100 UNIT/ML injection Inject 0.5 mLs (50 Units total) into the skin at bedtime. Patient not taking: Reported on 08/01/2014 06/27/14   Elayne Snare, MD  metFORMIN (GLUCOPHAGE-XR) 500 MG 24 hr tablet TAKE 1 TABLET (500 MG TOTAL) BY MOUTH 2 (TWO) TIMES DAILY WITH A MEAL. Patient taking differently: Take 1000 mg nightly at bedtime.    Elayne Snare, MD  metFORMIN  (GLUCOPHAGE-XR) 500 MG 24 hr tablet TAKE 1 TABLET BY MOUTH TWICE A DAY WITH A MEAL Patient not taking: Reported on 08/01/2014 05/12/14   Elayne Snare, MD   BP 180/80 mmHg  Pulse 83  Temp(Src) 97.9 F (36.6 C) (Oral)  Resp 18  SpO2 100% Physical Exam  Constitutional: He is oriented to person, place, and time. He appears well-developed and well-nourished. No distress.  HENT:  Head: Normocephalic and atraumatic.  Mouth/Throat: No oropharyngeal exudate.  Bilateral anterior oozing, no active bleeding  Eyes: EOM are normal. Pupils are equal, round, and reactive to light.  Neck: Normal range of motion. Neck supple.  Cardiovascular: Normal rate and regular rhythm.  Exam reveals no friction rub.   No murmur heard. Pulmonary/Chest: Effort normal and breath sounds normal. No respiratory distress. He has no wheezes. He has no rales.  Abdominal: He exhibits no distension. There is no tenderness. There is no rebound.  Musculoskeletal: Normal range of motion. He exhibits no edema.  Neurological: He is alert and oriented to person, place, and time.  Skin: He is not diaphoretic.  Nursing note and vitals reviewed.   ED Course  EPISTAXIS MANAGEMENT Date/Time: 08/02/2014 3:49 PM Performed by: Evelina Bucy Authorized by: Evelina Bucy Consent: Verbal consent obtained. Patient sedated: no Treatment site: right anterior and left anterior Repair method: silver nitrate Post-procedure assessment: bleeding stopped Treatment complexity: simple Patient tolerance: Patient tolerated the procedure well with no immediate complications   (including critical care time) Labs Review Labs Reviewed - No data to display  Imaging Review No results found.   EKG Interpretation None      MDM   Final diagnoses:  Anterior epistaxis    59 year old male with history of hypertension and nosebleeds remotely presents with 3 days of nosebleeds. Began today or running around outside. Today was very cold and windy. He  had mostly right air yes today and the day before, both naris today. Here hypertensive, 180/80 here. He takes is hypertension meds at night. Patient has anterior losing of bilateral nares. No active bleeding. We'll proceed with silver nitrate cauterization.  Silver nitrate stopped bleeding. Counseled on f/u with PCP for better HTN control. Stable for discharge.  Evelina Bucy, MD 08/02/14 [phone]

## 2014-08-01 NOTE — Discharge Instructions (Signed)

## 2014-08-01 NOTE — ED Notes (Signed)
Pt reports acute onset of epistaxis that began approx 1hr pta - denies injury/trauma - admits to hx of hypertension and has taken his medications as prescribed however takes his medication in the evening. Pt denies any pain or headache.

## 2014-08-01 NOTE — ED Notes (Signed)
Pataient states he has had intermittent nosebleeds x 3 days.

## 2014-08-02 ENCOUNTER — Emergency Department (HOSPITAL_COMMUNITY)
Admission: EM | Admit: 2014-08-02 | Discharge: 2014-08-02 | Disposition: A | Discharge status: Home / Self Care | Payer: No Typology Code available for payment source | Attending: Emergency Medicine | Admitting: Emergency Medicine

## 2014-08-02 ENCOUNTER — Encounter (HOSPITAL_COMMUNITY): Payer: Self-pay | Admitting: Emergency Medicine

## 2014-08-02 DIAGNOSIS — E780 Pure hypercholesterolemia: Secondary | ICD-10-CM | POA: Diagnosis not present

## 2014-08-02 DIAGNOSIS — Z87891 Personal history of nicotine dependence: Secondary | ICD-10-CM | POA: Diagnosis not present

## 2014-08-02 DIAGNOSIS — R040 Epistaxis: Secondary | ICD-10-CM | POA: Diagnosis not present

## 2014-08-02 DIAGNOSIS — E119 Type 2 diabetes mellitus without complications: Secondary | ICD-10-CM | POA: Diagnosis not present

## 2014-08-02 DIAGNOSIS — Z79899 Other long term (current) drug therapy: Secondary | ICD-10-CM | POA: Insufficient documentation

## 2014-08-02 DIAGNOSIS — Z794 Long term (current) use of insulin: Secondary | ICD-10-CM | POA: Insufficient documentation

## 2014-08-02 DIAGNOSIS — I10 Essential (primary) hypertension: Secondary | ICD-10-CM | POA: Diagnosis not present

## 2014-08-02 LAB — CBC WITH DIFFERENTIAL/PLATELET
BASOS PCT: 0 % (ref 0–1)
Basophils Absolute: 0 10*3/uL (ref 0.0–0.1)
EOS ABS: 0.1 10*3/uL (ref 0.0–0.7)
Eosinophils Relative: 1 % (ref 0–5)
HCT: 43.8 % (ref 39.0–52.0)
Hemoglobin: 14.4 g/dL (ref 13.0–17.0)
LYMPHS ABS: 1.1 10*3/uL (ref 0.7–4.0)
LYMPHS PCT: 19 % (ref 12–46)
MCH: 29 pg (ref 26.0–34.0)
MCHC: 32.9 g/dL (ref 30.0–36.0)
MCV: 88.3 fL (ref 78.0–100.0)
MONO ABS: 0.5 10*3/uL (ref 0.1–1.0)
MONOS PCT: 8 % (ref 3–12)
NEUTROS PCT: 72 % (ref 43–77)
Neutro Abs: 4.3 10*3/uL (ref 1.7–7.7)
PLATELETS: 232 10*3/uL (ref 150–400)
RBC: 4.96 MIL/uL (ref 4.22–5.81)
RDW: 13.3 % (ref 11.5–15.5)
WBC: 5.9 10*3/uL (ref 4.0–10.5)

## 2014-08-02 LAB — PROTIME-INR
INR: 1.02 (ref 0.00–1.49)
PROTHROMBIN TIME: 13.5 s (ref 11.6–15.2)

## 2014-08-02 MED ORDER — TRANEXAMIC ACID 100 MG/ML IV SOLN
500.0000 mg | Freq: Once | INTRAVENOUS | Status: AC
  Administered 2014-08-02: 500 mg via TOPICAL
  Filled 2014-08-02: qty 10

## 2014-08-02 NOTE — ED Notes (Signed)
Currently patient is not bleeding but was bleeding from both nostrils when triaging.

## 2014-08-02 NOTE — ED Provider Notes (Signed)
CSN: [id number]     Arrival date & time 08/02/14  0312 History   First MD Initiated Contact with Patient 08/02/14 [phone]     Chief Complaint  Patient presents with  . Epistaxis     (Consider location/radiation/quality/duration/timing/severity/associated sxs/prior Treatment) HPI   Austin Norris is a 59 y.o. male with past medical history of hypertension, hyperlipidemia, diabetes presenting today with nosebleeds. He was seen here earlier today and has bleeding spontaneously stopped. Silver nitrate sticks were applied.  Patient returns for recurrent bleeding. He denies any heavy nose bloating, or nose picking. He states that he has been sneezing today. He denies being on any blood thinners. He denies recent infections although he may have some recent congestion. Patient has no further complaints.  10 Systems reviewed and are negative for acute change except as noted in the HPI.    Past Medical History  Diagnosis Date  . Diabetes mellitus   . Hypertension   . Hypercholesteremia    Past Surgical History  Procedure Laterality Date  . Cardiac catheterization     Family History  Problem Relation Age of Onset  . Heart disease Mother   . Diabetes Father   . Diabetes Sister    History  Substance Use Topics  . Smoking status: Former Smoker    Quit date: 05/21/1997  . Smokeless tobacco: Never Used  . Alcohol Use: No    Review of Systems    Allergies  Review of patient's allergies indicates no known allergies.  Home Medications   Prior to Admission medications   Medication Sig Start Date End Date Taking? Authorizing Provider  amLODipine (NORVASC) 5 MG tablet TAKE 1 TABLET (5 MG TOTAL) BY MOUTH DAILY. Patient taking differently: Take 1 tablet (5mg  total) by mouth daily. 05/02/14  Yes Elayne Snare, MD  atorvastatin (LIPITOR) 20 MG tablet TAKE 1 TABLET EVERY DAY Patient taking differently: Take 1 tablet every day. 06/29/14  Yes Elayne Snare, MD  FREESTYLE LITE test strip USE AS  INSTRUCTED TO CHECK BLOOD SUGARS TWICE A DAY, DX CODE 250.00 Patient taking differently: Use as instructed to check blood sugars twice a day, Dx code 250.00. 01/16/14  Yes Elayne Snare, MD  insulin aspart (NOVOLOG FLEXPEN) 100 UNIT/ML FlexPen Inject 20 Units into the skin 3 (three) times daily with meals. 01/04/14  Yes Elayne Snare, MD  Insulin Glargine (LANTUS SOLOSTAR) 100 UNIT/ML Solostar Pen Inject 50 Units into the skin daily at 10 pm. 01/04/14  Yes Elayne Snare, MD  Insulin Syringe-Needle U-100 (INSULIN SYRINGE 1CC/31GX5/16") 31G X 5/16" 1 ML MISC Use to inject lantus daily 01/09/14  Yes Elayne Snare, MD  INVOKANA 100 MG TABS tablet TAKE 1 TABLET BY MOUTH EVERY DAY BEFORE BREAKFAST Patient taking differently: Take 1 tablet by mouth every day before breakfast. 05/04/14  Yes Elayne Snare, MD  metFORMIN (GLUCOPHAGE-XR) 500 MG 24 hr tablet TAKE 1 TABLET (500 MG TOTAL) BY MOUTH 2 (TWO) TIMES DAILY WITH A MEAL. Patient taking differently: Take 1000 mg nightly at bedtime.   Yes Elayne Snare, MD  quinapril (ACCUPRIL) 40 MG tablet TAKE 1 TABLET BY MOUTH AT BEDTIME Patient taking differently: Take 1 tablet by mouth at bedtime. 05/12/14  Yes Elayne Snare, MD  tamsulosin (FLOMAX) 0.4 MG CAPS capsule Take 0.4 mg by mouth at bedtime. 07/14/14  Yes Historical Provider, MD  amLODipine (NORVASC) 5 MG tablet TAKE 1 TABLET (5 MG TOTAL) BY MOUTH DAILY. Patient not taking: Reported on 08/01/2014 05/11/14   Elayne Snare, MD  amLODipine (  NORVASC) 5 MG tablet TAKE 1 TABLET (5 MG TOTAL) BY MOUTH DAILY. Patient not taking: Reported on 08/01/2014 05/15/14   Elayne Snare, MD  atorvastatin (LIPITOR) 20 MG tablet TAKE 1 TABLET EVERY DAY Patient not taking: Reported on 08/01/2014 07/10/14   Elayne Snare, MD  glucose blood (FREESTYLE TEST STRIPS) test strip Use as instructed to check blood sugars once a day Patient not taking: Reported on 08/02/2014 12/10/12   Elayne Snare, MD  insulin glargine (LANTUS) 100 UNIT/ML injection Inject 0.5 mLs (50 Units total) into the  skin at bedtime. Patient not taking: Reported on 08/01/2014 06/27/14   Elayne Snare, MD  metFORMIN (GLUCOPHAGE-XR) 500 MG 24 hr tablet TAKE 1 TABLET BY MOUTH TWICE A DAY WITH A MEAL Patient not taking: Reported on 08/01/2014 05/12/14   Elayne Snare, MD   BP 147/72 mmHg  Pulse 81  Temp(Src) 98.1 F (36.7 C) (Oral)  Resp 20  SpO2 94% Physical Exam  Constitutional: He is oriented to person, place, and time. Vital signs are normal. He appears well-developed and well-nourished.  Non-toxic appearance. He does not appear ill. No distress.  HENT:  Head: Normocephalic and atraumatic.  Mouth/Throat: Oropharynx is clear and moist. No oropharyngeal exudate.  Bilateral nares with dried blood seen. No active hemorrhage. No blood seen in oropharynx.  Eyes: Conjunctivae and EOM are normal. Pupils are equal, round, and reactive to light. No scleral icterus.  Neck: Normal range of motion. Neck supple. No tracheal deviation, no edema, no erythema and normal range of motion present. No thyroid mass and no thyromegaly present.  Cardiovascular: Normal rate, regular rhythm, S1 normal, S2 normal, normal heart sounds, intact distal pulses and normal pulses.  Exam reveals no gallop and no friction rub.   No murmur heard. Pulses:      Radial pulses are 2+ on the right side, and 2+ on the left side.       Dorsalis pedis pulses are 2+ on the right side, and 2+ on the left side.  Pulmonary/Chest: Effort normal and breath sounds normal. No respiratory distress. He has no wheezes. He has no rhonchi. He has no rales.  Abdominal: Soft. Normal appearance and bowel sounds are normal. He exhibits no distension, no ascites and no mass. There is no hepatosplenomegaly. There is no tenderness. There is no rebound, no guarding and no CVA tenderness.  Musculoskeletal: Normal range of motion. He exhibits no edema or tenderness.  Lymphadenopathy:    He has no cervical adenopathy.  Neurological: He is alert and oriented to person, place, and  time. He has normal strength. No cranial nerve deficit or sensory deficit. He exhibits normal muscle tone.  Skin: Skin is warm, dry and intact. No petechiae and no rash noted. He is not diaphoretic. No erythema. No pallor.  Psychiatric: He has a normal mood and affect. His behavior is normal. Judgment normal.  Nursing note and vitals reviewed.   ED Course  Procedures (including critical care time) Labs Review Labs Reviewed  CBC WITH DIFFERENTIAL/PLATELET  PROTIME-INR    Imaging Review No results found.   EKG Interpretation None      MDM   Final diagnoses:  Epistaxis, recurrent    Patient presents emergency department for nosebleed which is spontaneously resolved. On exam I can see the remnants of several nitrate cauterization. Because the patient's a bounce back will give TXA nebulized treatment for the epistaxis.  Laboratory studies reveal normal hemoglobin, platelets, INR. Repeat education was given, ENT follow-up provided. He had no  further episodes of epistaxis in emergency department. His vital signs were within his normal limits and is safe for discharge.    Everlene Balls, MD 08/02/14 [phone]

## 2014-08-02 NOTE — ED Notes (Signed)
Pt reports nosebleed starting around 1am. Pt states he was seen earlier today in ED for same.

## 2014-08-02 NOTE — Discharge Instructions (Signed)
Nosebleed Mr. Quam, avoid any pressure to nose including sneezing, nose blowing, nose picking. See ENT within 3 days for follow-up. If symptoms return or worsen come back to emergency department immediately. Thank you. A nosebleed can be caused by many things, including:  Getting hit hard in the nose.  Infections.  Dry nose.  Colds.  Medicines. Your doctor may do lab testing if you get nosebleeds a lot and the cause is not known. HOME CARE   If your nose was packed with material, keep it there until your doctor takes it out. Put the pack back in your nose if the pack falls out.  Do not blow your nose for 12 hours after the nosebleed.  Sit up and bend forward if your nose starts bleeding again. Pinch the front half of your nose nonstop for 20 minutes.  Put petroleum jelly inside your nose every morning if you have a dry nose.  Use a humidifier to make the air less dry.  Do not take aspirin.  Try not to strain, lift, or bend at the waist for many days after the nosebleed. GET HELP RIGHT AWAY IF:   Nosebleeds keep happening and are hard to stop or control.  You have bleeding or bruises that are not normal on other parts of the body.  You have a fever.  The nosebleeds get worse.  You get lightheaded, feel faint, sweaty, or throw up (vomit) blood. MAKE SURE YOU:   Understand these instructions.  Will watch your condition.  Will get help right away if you are not doing well or get worse. Document Released: 01/22/2008 Document Revised: 07/07/2011 Document Reviewed: 01/22/2008 [facility] Patient Information 2015 [place], [place]. This information is not intended to replace advice given to you by your health care provider. Make sure you discuss any questions you have with your health care provider.

## 2014-08-03 ENCOUNTER — Ambulatory Visit (INDEPENDENT_AMBULATORY_CARE_PROVIDER_SITE_OTHER): Payer: No Typology Code available for payment source | Admitting: Endocrinology

## 2014-08-03 ENCOUNTER — Encounter: Payer: Self-pay | Admitting: Endocrinology

## 2014-08-03 ENCOUNTER — Other Ambulatory Visit: Payer: Self-pay | Admitting: *Deleted

## 2014-08-03 VITALS — BP 160/90 | HR 68 | Temp 99.5°F | Wt 181.6 lb

## 2014-08-03 DIAGNOSIS — E1165 Type 2 diabetes mellitus with hyperglycemia: Secondary | ICD-10-CM

## 2014-08-03 DIAGNOSIS — IMO0002 Reserved for concepts with insufficient information to code with codable children: Secondary | ICD-10-CM

## 2014-08-03 DIAGNOSIS — I10 Essential (primary) hypertension: Secondary | ICD-10-CM | POA: Diagnosis not present

## 2014-08-03 MED ORDER — HYDROCHLOROTHIAZIDE 12.5 MG PO CAPS: 12.5000 mg | ORAL_CAPSULE | Freq: Every day | ORAL | Status: DC

## 2014-08-03 NOTE — Progress Notes (Signed)
Pre visit review using our clinic review tool, if applicable. No additional management support is needed unless otherwise documented below in the visit note. 

## 2014-08-03 NOTE — Progress Notes (Signed)
Subjective:     Patient ID: Austin Norris, male   DOB: 07-14-1955, 59 y.o.   MRN: [id number]  HPI  Chief complaint: High blood pressure  Has been going to the emergency room for the last few days because of epistaxis. His blood pressure has been consistently high in the ER measured on an automatic monitor, highest reading 017 systolic He was not told to change the blood pressure medication He thinks he has been checking his blood pressure at home periodically and has not found it higher than usual but does not remember exact reading  He has been taking his amlodipine and quinapril consistently as directed He thinks he is not eating a lot of fast food or high sodium foods but does not otherwise restrict his sodium intake at home in cooking  He again had epistaxis today and apparently had cauterization of the nasal mucosa today  Review of Systems    he has had some headache today  Objective:   Physical Exam  BP 160/90 mmHg  Pulse 68  Temp(Src) 99.5 F (37.5 C) (Oral)  Wt 181 lb 9.6 oz (82.373 kg)  Repeat blood pressure 164/84 Pulse regular     Assessment:     Increased blood pressure, not clear why this has changed despite good compliance with his medication     Plan:     He will add HCTZ to his regimen and also monitor blood pressure at home Recommended bringing his monitor from home to the office for comparison Check renal function and electrolytes

## 2014-08-03 NOTE — Patient Instructions (Signed)
Check BP 3x per week  Limit salty and fried  Foods  Please check blood sugars at least half the time about 2 hours after any meal and 3 times per week on waking up. Please bring blood sugar monitor to each visit. Recommended blood sugar levels about 2 hours after meal is 140-180 and on waking up 90-130

## 2014-08-04 LAB — BASIC METABOLIC PANEL
BUN: 12 mg/dL (ref 6–23)
CHLORIDE: 100 meq/L (ref 96–112)
CO2: 24 meq/L (ref 19–32)
CREATININE: 0.81 mg/dL (ref 0.40–1.50)
Calcium: 9.6 mg/dL (ref 8.4–10.5)
GFR: 125.24 mL/min (ref >60.00)
Glucose, Bld: 136 mg/dL — ABNORMAL HIGH (ref 70–99)
Potassium: 4 mEq/L (ref 3.5–5.1)
Sodium: 134 mEq/L — ABNORMAL LOW (ref 135–145)

## 2014-08-04 LAB — HEMOGLOBIN A1C: HEMOGLOBIN A1C: 6.8 % — AB (ref 4.6–6.5)

## 2014-08-07 ENCOUNTER — Telehealth: Payer: Self-pay

## 2014-08-07 NOTE — Telephone Encounter (Signed)
Pt states that he came in for a check up.   Has a lot of pain right now and he wants to know what type of analgesic he can take that will not interfere with the other medications that he is currently on.

## 2014-08-07 NOTE — Telephone Encounter (Signed)
What type of pain is he having?  He needs to discuss with PCP.  He should avoid Advil and Aleve

## 2014-08-08 NOTE — Telephone Encounter (Signed)
Pt informed of MD response.  

## 2014-08-28 ENCOUNTER — Other Ambulatory Visit: Payer: No Typology Code available for payment source

## 2014-08-31 ENCOUNTER — Encounter: Payer: Self-pay | Admitting: Endocrinology

## 2014-08-31 ENCOUNTER — Ambulatory Visit (INDEPENDENT_AMBULATORY_CARE_PROVIDER_SITE_OTHER): Payer: No Typology Code available for payment source | Admitting: Endocrinology

## 2014-08-31 VITALS — BP 130/72 | HR 64 | Temp 97.9°F | Resp 16 | Ht 65.25 in | Wt 182.6 lb

## 2014-08-31 DIAGNOSIS — IMO0002 Reserved for concepts with insufficient information to code with codable children: Secondary | ICD-10-CM

## 2014-08-31 DIAGNOSIS — E1165 Type 2 diabetes mellitus with hyperglycemia: Secondary | ICD-10-CM | POA: Diagnosis not present

## 2014-08-31 DIAGNOSIS — I10 Essential (primary) hypertension: Secondary | ICD-10-CM | POA: Diagnosis not present

## 2014-08-31 NOTE — Progress Notes (Signed)
Patient ID: Austin Norris, male   DOB: 04-Jul-1955, 59 y.o.   MRN: [id number]   Reason for Appointment: Diabetes follow-up   History of Present Illness   Diagnosis: Type 2 DIABETES MELITUS, long-standing     He has been on insulin to treat his diabetes for several years partly because of failure of oral agents and also for reducing cost of medications He is generally checking his blood sugars once or twice a day but does not keep a record Overall has had somewhat poor control with A1c consistently over 7%  Recent history:  He has been on Invokana since at least 2015 in addition to his insulin regimen Recently his A1c has come down below 7% the first time He has not lost much weight, however Again he did not bring his monitor for download not clear how often or when he is checking his blood sugars DIET: He is to be getting unbalanced meals as below  Fasting readings: Have been good and he does not think they are fluctuating are getting low Postprandial readings: He is checking his blood sugar sometimes about an hour after eating and these are somewhat high Does not check blood sugars any other time after breakfast or lunch Hypoglycemia: None recently  Oral hypoglycemic drugs: Metformin 1g at dinner       Side effects from medications: None Insulin regimen: Lantus usually 50 units at bedtime, NovoLog 20-25 before supper, Novolog in am 10-15      Proper timing of medications in relation to meals: Yes.          Monitors blood glucose: Once a day.    Glucometer:  ?  Freestyle or [facility] brand         Blood Glucose readings from recall: readings before breakfast: 91-130 and 1 hr Pcs 180-200         Meals: 1-2 meals per day, usually high carbohydrate with bread and milk in the morning and lunch, sometimes having eggs at lunchtime., sometimes sweets in pm, fried food occasionally         Physical activity: exercise: Mostly walking at work, is active in the parking lot of his car [place] visit: Most recent: None             Wt Readings from Last 3 Encounters:  08/31/14 182 lb 9.6 oz (82.827 kg)  08/03/14 181 lb 9.6 oz (82.373 kg)  05/01/14 184 lb 9.6 oz (83.734 kg)   LABS:  Lab Results  Component Value Date   HGBA1C 6.8* 08/03/2014   HGBA1C 7.3* 05/03/2014   HGBA1C 7.8* 12/29/2013   Lab Results  Component Value Date   MICROALBUR 0.2 03/11/2013   LDLCALC 88 05/03/2014   CREATININE 0.81 08/03/2014        Medication List       This list is accurate as of: 08/31/14  2:06 PM.  Always use your most recent med list.               amLODipine 5 MG tablet  Commonly known as:  NORVASC  TAKE 1 TABLET (5 MG TOTAL) BY MOUTH DAILY.     atorvastatin 20 MG tablet  Commonly known as:  LIPITOR  TAKE 1 TABLET EVERY DAY     FREESTYLE LITE test strip  Generic drug:  glucose blood  USE AS INSTRUCTED TO CHECK BLOOD SUGARS TWICE A DAY, DX CODE 250.00  hydrochlorothiazide 12.5 MG capsule  Commonly known as:  MICROZIDE  Take 1 capsule (12.5 mg total) by mouth daily.     insulin aspart 100 UNIT/ML FlexPen  Commonly known as:  NOVOLOG FLEXPEN  Inject 20 Units into the skin 3 (three) times daily with meals.     Insulin Glargine 100 UNIT/ML Solostar Pen  Commonly known as:  LANTUS SOLOSTAR  Inject 50 Units into the skin daily at 10 pm.     INSULIN SYRINGE 1CC/31GX5/16" 31G X 5/16" 1 ML Misc  Use to inject lantus daily     INVOKANA 100 MG Tabs tablet  Generic drug:  canagliflozin  TAKE 1 TABLET BY MOUTH EVERY DAY BEFORE BREAKFAST     metFORMIN 500 MG 24 hr tablet  Commonly known as:  GLUCOPHAGE-XR  TAKE 1 TABLET (500 MG TOTAL) BY MOUTH 2 (TWO) TIMES DAILY WITH A MEAL.     quinapril 40 MG tablet  Commonly known as:  ACCUPRIL  TAKE 1 TABLET BY MOUTH AT BEDTIME     tamsulosin 0.4 MG Caps capsule  Commonly known as:  FLOMAX  Take 0.4 mg by mouth at bedtime.        Allergies: No Known Allergies  Past Medical History  Diagnosis Date   . Diabetes mellitus   . Hypertension   . Hypercholesteremia     Past Surgical History  Procedure Laterality Date  . Cardiac catheterization      Family History  Problem Relation Age of Onset  . Heart disease Mother   . Diabetes Father   . Diabetes Sister    No colon cancer  Social History:  reports that he quit smoking about 17 years ago. He has never used smokeless tobacco. He reports that he does not drink alcohol or use illicit drugs.  Review of Systems:  Last eye exam 9/15, normal   HYPERTENSION:  he has had long-standing hypertension, he thinks his systolic readings with home  meter have been  around 130-140 Is now taking HCTZ in addition to his 5 mg amlodipine  along with his Accupril 40 mg HCTZ was started in 4/16 when blood pressure was significantly high and associated with  epistaxis  HYPERLIPIDEMIA: The lipid abnormality consists of elevated LDL. Previously had been irregular with taking his Lipitor because of fear of side effects.  Since last visit he was taking his Lipitor regularly and the dose was increased to 20 mg previously  Has had some evidence of CAD in the past but currently asymptomatic  Lab Results  Component Value Date   CHOL 165 05/03/2014   HDL 49.20 05/03/2014   LDLCALC 88 05/03/2014   LDLDIRECT 89.8 12/29/2013   TRIG 139.0 05/03/2014   CHOLHDL 3 05/03/2014    No history of numbness or tingling in his feet but has some occasional sharp pains.        Examination:   BP 130/72 mmHg  Pulse 64  Temp(Src) 97.9 F (36.6 C)  Resp 16  Ht 5' 5.25" (1.657 m)  Wt 182 lb 9.6 oz (82.827 kg)  BMI 30.17 kg/m2  SpO2 97%  Body mass index is 30.17 kg/(m^2).   No pedal edema  ASSESSMENT/ PLAN:   Diabetes type 2   The patient's diabetes control is overall better probably because of taking Invokana consistently Difficult to assess his control as he is checking blood sugars irregularly and mostly at breakfast and bedtime and does not bring any  records He may be getting high readings after breakfast and  lunch when he is not getting much protein Discussed that he should at least take 5 units with breakfast since he is having little carbohydrate and drinking milk also  He does need to continue same dose of Lantus but consider reducing it if blood sugars are trending below 90  HYPERTENSION: His blood pressure is better controlled with adding HCTZ Advised him to take this in the morning since he gets nocturia with this at night  Preventive care: He will establish with a PCP  Doyle Tegethoff 08/31/2014, 2:06 PM    No visits with results within 1 Day(s) from this visit. Latest known visit with results is:  Office Visit on 08/03/2014  Component Date Value Ref Range Status  . Hgb A1c MFr Bld 08/03/2014 6.8* 4.6 - 6.5 % Final   Glycemic Control Guidelines for People with Diabetes:Non Diabetic:  <6%Goal of Therapy: <7%Additional Action Suggested:  >8%   . Sodium 08/03/2014 134* 135 - 145 mEq/L Final  . Potassium 08/03/2014 4.0  3.5 - 5.1 mEq/L Final  . Chloride 08/03/2014 100  96 - 112 mEq/L Final  . CO2 08/03/2014 24  19 - 32 mEq/L Final  . Glucose, Bld 08/03/2014 136* 70 - 99 mg/dL Final  . BUN 08/03/2014 12  6 - 23 mg/dL Final  . Creatinine, Ser 08/03/2014 0.81  0.40 - 1.50 mg/dL Final  . Calcium 08/03/2014 9.6  8.4 - 10.5 mg/dL Final  . GFR 08/03/2014 125.24  >60.00 mL/min Final

## 2014-08-31 NOTE — Patient Instructions (Addendum)
Egg or other protein at each meal  5 Units Novolog at breakfast  If sugar in am is < 90 then reduce Lantus by 2-4 units  Check cost of TOUJEO INSULIN  Take HCTZ pill in am  Please check blood sugars at least half the time about 2 hours after any meal and 3 times per week on waking up.   Please bring blood sugar monitor to each visit. Recommended blood sugar levels about 2 hours after meal is 140-180 and on waking up 90-130

## 2014-09-12 ENCOUNTER — Other Ambulatory Visit: Payer: Self-pay | Admitting: Endocrinology

## 2014-09-13 ENCOUNTER — Other Ambulatory Visit: Payer: Self-pay | Admitting: *Deleted

## 2014-09-13 MED ORDER — INSULIN DETEMIR 100 UNIT/ML FLEXPEN: 50.0000 [IU] | Freq: Every day | SUBCUTANEOUS | Status: DC

## 2014-09-13 MED ORDER — INSULIN DETEMIR 100 UNIT/ML FLEXPEN
50.0000 [IU] | Freq: Every day | SUBCUTANEOUS | Status: DC
Start: 2014-09-13 — End: 2015-02-08

## 2014-10-13 ENCOUNTER — Other Ambulatory Visit: Payer: Self-pay | Admitting: Endocrinology

## 2014-10-19 ENCOUNTER — Other Ambulatory Visit: Payer: Self-pay | Admitting: *Deleted

## 2014-10-19 ENCOUNTER — Telehealth: Payer: Self-pay | Admitting: Endocrinology

## 2014-10-19 MED ORDER — GLUCOSE BLOOD VI STRP: ORAL_STRIP | Status: DC

## 2014-10-19 MED ORDER — INSULIN GLARGINE 100 UNIT/ML SOLOSTAR PEN: 50.0000 [IU] | PEN_INJECTOR | Freq: Every day | SUBCUTANEOUS | Status: DC

## 2014-10-19 MED ORDER — EMPAGLIFLOZIN 10 MG PO TABS: 10.0000 mg | ORAL_TABLET | Freq: Every day | ORAL | Status: DC

## 2014-10-19 MED ORDER — AMLODIPINE BESYLATE 5 MG PO TABS: ORAL_TABLET | ORAL | Status: DC

## 2014-10-19 MED ORDER — QUINAPRIL HCL 40 MG PO TABS: 40.0000 mg | ORAL_TABLET | Freq: Every day | ORAL | Status: DC

## 2014-10-19 MED ORDER — CANAGLIFLOZIN 100 MG PO TABS: ORAL_TABLET | ORAL | Status: DC

## 2014-10-19 MED ORDER — HYDROCHLOROTHIAZIDE 12.5 MG PO CAPS: 12.5000 mg | ORAL_CAPSULE | Freq: Every day | ORAL | Status: DC

## 2014-10-19 MED ORDER — ATORVASTATIN CALCIUM 20 MG PO TABS: 20.0000 mg | ORAL_TABLET | Freq: Every day | ORAL | Status: DC

## 2014-10-19 MED ORDER — INSULIN ASPART 100 UNIT/ML FLEXPEN: 20.0000 [IU] | PEN_INJECTOR | Freq: Three times a day (TID) | SUBCUTANEOUS | Status: DC

## 2014-10-19 NOTE — Telephone Encounter (Signed)
8 prescriptions were sent for a 90 day supply, no refills given

## 2014-10-19 NOTE — Telephone Encounter (Signed)
Patient called and would like all his medications in 3 month supply as he will be traveling out of the country   Pharmacy: [facility]    Thank you

## 2014-10-20 ENCOUNTER — Telehealth: Payer: Self-pay | Admitting: Endocrinology

## 2014-10-20 NOTE — Telephone Encounter (Signed)
Pt needs refills on all medicine 3 months because he is going [place] seas ( Needs ASAP), please call pt

## 2014-10-20 NOTE — Telephone Encounter (Signed)
Those were already called in yesterday

## 2014-10-23 ENCOUNTER — Telehealth: Payer: Self-pay | Admitting: Endocrinology

## 2014-10-23 ENCOUNTER — Other Ambulatory Visit: Payer: Self-pay

## 2014-10-23 ENCOUNTER — Other Ambulatory Visit: Payer: Self-pay | Admitting: *Deleted

## 2014-10-23 MED ORDER — INSULIN GLARGINE 300 UNIT/ML ~~LOC~~ SOPN: 50.0000 [IU] | PEN_INJECTOR | Freq: Every day | SUBCUTANEOUS | Status: DC

## 2014-10-23 MED ORDER — DAPAGLIFLOZIN PROPANEDIOL 5 MG PO TABS: 5.0000 mg | ORAL_TABLET | Freq: Every day | ORAL | Status: DC

## 2014-10-23 NOTE — Telephone Encounter (Signed)
Team Heath noted dated 10/21/14 at 3:50 pm  Chief Complaint Prescription Refill or Medication Request (non symptomatic) Initial Comment Caller states needs to get refill on 3 medications. Is traveling out of the country tomorrow and needs to get the meds today.  Additional Documentation ---I explained per the client profile, that we can only call in refills enough until the office opens (in this case would be 1 days worth). I explained if he needs his medications now, it would be best to go to a walk in clinic for three months supply.

## 2014-10-24 ENCOUNTER — Telehealth: Payer: Self-pay | Admitting: *Deleted

## 2014-10-24 NOTE — Telephone Encounter (Signed)
We have sent in [place], [place] and Farxiga, none of these are covered by patients insurance company.  Patient is out of the country for 3 months. Please advise.

## 2014-10-24 NOTE — Telephone Encounter (Signed)
Noted  

## 2014-10-24 NOTE — Telephone Encounter (Signed)
No need to take the medications now

## 2014-11-28 ENCOUNTER — Other Ambulatory Visit: Payer: No Typology Code available for payment source

## 2014-12-01 ENCOUNTER — Ambulatory Visit: Payer: No Typology Code available for payment source | Admitting: Endocrinology

## 2014-12-17 ENCOUNTER — Other Ambulatory Visit: Payer: Self-pay | Admitting: Endocrinology

## 2014-12-27 ENCOUNTER — Other Ambulatory Visit: Payer: No Typology Code available for payment source

## 2015-01-14 ENCOUNTER — Other Ambulatory Visit: Payer: Self-pay | Admitting: Endocrinology

## 2015-01-27 ENCOUNTER — Other Ambulatory Visit: Payer: Self-pay | Admitting: Endocrinology

## 2015-01-29 ENCOUNTER — Telehealth: Payer: Self-pay | Admitting: Endocrinology

## 2015-01-29 NOTE — Telephone Encounter (Signed)
Patient would like a refill of his medication for his b/s, he failed to tell me what the name of the medication was, please advise

## 2015-01-30 ENCOUNTER — Other Ambulatory Visit: Payer: No Typology Code available for payment source

## 2015-01-30 ENCOUNTER — Other Ambulatory Visit: Payer: Self-pay | Admitting: Endocrinology

## 2015-01-30 ENCOUNTER — Telehealth: Payer: Self-pay | Admitting: Endocrinology

## 2015-01-30 NOTE — Telephone Encounter (Signed)
Patient called stating he would like a refill on his Rx  Rx: Insulin   Thank You

## 2015-01-30 NOTE — Telephone Encounter (Signed)
May refill insulin

## 2015-01-30 NOTE — Telephone Encounter (Signed)
Please see below, patient has not been here since May, and his rx's have been denied, he cancelled his lab appointment today and r/s it for tomorrow, he has an appt on Friday to see you, Please advise if okay to send in Insulin?

## 2015-01-31 ENCOUNTER — Other Ambulatory Visit: Payer: Self-pay | Admitting: *Deleted

## 2015-01-31 ENCOUNTER — Other Ambulatory Visit (INDEPENDENT_AMBULATORY_CARE_PROVIDER_SITE_OTHER): Payer: No Typology Code available for payment source

## 2015-01-31 DIAGNOSIS — E1165 Type 2 diabetes mellitus with hyperglycemia: Secondary | ICD-10-CM

## 2015-01-31 DIAGNOSIS — IMO0002 Reserved for concepts with insufficient information to code with codable children: Secondary | ICD-10-CM

## 2015-01-31 LAB — LIPID PANEL
CHOL/HDL RATIO: 3
Cholesterol: 158 mg/dL (ref 0–200)
HDL: 54.3 mg/dL (ref >39.00)
LDL Cholesterol: 86 mg/dL (ref 0–99)
NONHDL: 103.86
Triglycerides: 89 mg/dL (ref 0.0–149.0)
VLDL: 17.8 mg/dL (ref 0.0–40.0)

## 2015-01-31 LAB — BASIC METABOLIC PANEL
BUN: 13 mg/dL (ref 6–23)
CO2: 29 meq/L (ref 19–32)
CREATININE: 0.86 mg/dL (ref 0.40–1.50)
Calcium: 9.7 mg/dL (ref 8.4–10.5)
Chloride: 100 mEq/L (ref 96–112)
GFR: 116.68 mL/min (ref >60.00)
GLUCOSE: 112 mg/dL — AB (ref 70–99)
Potassium: 4.1 mEq/L (ref 3.5–5.1)
Sodium: 136 mEq/L (ref 135–145)

## 2015-01-31 LAB — MICROALBUMIN / CREATININE URINE RATIO
Creatinine,U: 43.4 mg/dL
Microalb Creat Ratio: 1.6 mg/g (ref 0.0–30.0)
Microalb, Ur: <0.7 mg/dL (ref 0.0–1.9)

## 2015-01-31 LAB — HEMOGLOBIN A1C: HEMOGLOBIN A1C: 7.3 % — AB (ref 4.6–6.5)

## 2015-01-31 MED ORDER — INSULIN ASPART 100 UNIT/ML FLEXPEN: 20.0000 [IU] | PEN_INJECTOR | Freq: Three times a day (TID) | SUBCUTANEOUS | Status: DC

## 2015-01-31 MED ORDER — INSULIN GLARGINE 300 UNIT/ML ~~LOC~~ SOPN: 50.0000 [IU] | PEN_INJECTOR | Freq: Every day | SUBCUTANEOUS | Status: DC

## 2015-01-31 NOTE — Telephone Encounter (Signed)
rx's for insulin have been sent

## 2015-02-02 ENCOUNTER — Ambulatory Visit: Payer: No Typology Code available for payment source | Admitting: Endocrinology

## 2015-02-08 ENCOUNTER — Ambulatory Visit (INDEPENDENT_AMBULATORY_CARE_PROVIDER_SITE_OTHER): Payer: No Typology Code available for payment source | Admitting: Endocrinology

## 2015-02-08 ENCOUNTER — Encounter: Payer: Self-pay | Admitting: Endocrinology

## 2015-02-08 VITALS — BP 145/76 | HR 71 | Temp 98.4°F | Resp 14 | Ht 65.25 in | Wt 186.0 lb

## 2015-02-08 DIAGNOSIS — E1165 Type 2 diabetes mellitus with hyperglycemia: Secondary | ICD-10-CM | POA: Diagnosis not present

## 2015-02-08 DIAGNOSIS — Z794 Long term (current) use of insulin: Secondary | ICD-10-CM | POA: Diagnosis not present

## 2015-02-08 DIAGNOSIS — Z23 Encounter for immunization: Secondary | ICD-10-CM

## 2015-02-08 DIAGNOSIS — E7800 Pure hypercholesterolemia, unspecified: Secondary | ICD-10-CM

## 2015-02-08 NOTE — Patient Instructions (Addendum)
Novolog 30-35 at dinner, keep sugar 2 hrs after dinner in 140-170 range  Bring sugar diary next time  Avoid juices

## 2015-02-08 NOTE — Progress Notes (Signed)
Patient ID: Austin Norris, male   DOB: 10-13-1955, 59 y.o.   MRN: [id number]   Reason for Appointment: Diabetes follow-up   History of Present Illness   Diagnosis: Type 2 DIABETES MELITUS, long-standing     He has been on insulin to treat his diabetes for several years partly because of failure of oral agents and also for reducing cost of medications He is generally checking his blood sugars once or twice a day but does not keep a record Overall has had somewhat poor control with A1c consistently over 7%  Recent history:    He has been on Invokana since at least 2015 in addition to his insulin regimen He thinks he has been compliant with all his medications Although his A1c was below 7 previously treated up to 7.3 again  Again he did not bring his monitor for download not clear how often or when he is checking his blood sugars.  Still using [facility] brand monitor as he thinks he cannot get the test strips for Freestyle  Current blood sugar patterns and problems identified: Fasting readings: Have been good and he does not think they are fluctuating significantly.  Fasting glucose 112 in the lab He thinks his blood sugars are fairly good before supper Postprandial readings: He is checking his blood sugar sometimes at bedtime and he thinks they are mostly around 200 although he is not giving consistent answers about an hour after dinner eating and these are somewhat high Not clear why he is taking as much as 30 units of lower log at suppertime, previously was taking less and this is still not controlling his readings at night Does not check blood sugars after breakfast or lunch Hypoglycemia: This will happen only occasionally, once took his Novolog late at night instead of taking the Lantus DIET: He still has some carbohydrate without protein at times and in the morning will drink a lot of juice.  Oral hypoglycemic drugs: Metformin 1g at dinner       Side effects from medications:  None Insulin regimen: Lantus usually 50 units at bedtime, NovoLog 25-30 before supper, Novolog in am 10-15      Proper timing of medications in relation to meals: Yes.          Monitors blood glucose: ?  Once a day.    Glucometer:   [facility] brand         Blood Glucose readings from recall: readings before breakfast: 110-120/170 acs 110; hs 200         Meals: 1-2 meals per day, usually high carbohydrate with either juice or bread and milk in the morning and lunch, sometimes having eggs at lunchtime., sometimes sweets in pm, fried food occasionally         Physical activity: exercise: Mostly walking at work, is active in the parking lot of his car [place] visit: Most recent: None             Wt Readings from Last 3 Encounters:  02/08/15 186 lb (84.369 kg)  08/31/14 182 lb 9.6 oz (82.827 kg)  08/03/14 181 lb 9.6 oz (82.373 kg)   LABS:  Lab Results  Component Value Date   HGBA1C 7.3* 01/31/2015   HGBA1C 6.8* 08/03/2014   HGBA1C 7.3* 05/03/2014   Lab Results  Component Value Date   MICROALBUR <0.7 01/31/2015   LDLCALC 86 01/31/2015   CREATININE 0.86 01/31/2015  Medication List       This list is accurate as of: 02/08/15 11:59 PM.  Always use your most recent med list.               amLODipine 5 MG tablet  Commonly known as:  NORVASC  TAKE 1 TABLET (5 MG TOTAL) BY MOUTH DAILY.     atorvastatin 20 MG tablet  Commonly known as:  LIPITOR  TAKE 1 TABLET EVERY DAY     atorvastatin 20 MG tablet  Commonly known as:  LIPITOR  Take 1 tablet (20 mg total) by mouth daily.     canagliflozin 100 MG Tabs tablet  Commonly known as:  INVOKANA  TAKE 1 TABLET BY MOUTH EVERY DAY BEFORE BREAKFAST     glucose blood test strip  Commonly known as:  FREESTYLE LITE  Use as instructed to check blood sugar 2 times a day     hydrochlorothiazide 12.5 MG capsule  Commonly known as:  MICROZIDE  TAKE ONE CAPSULE BY MOUTH DAILY     insulin aspart 100 UNIT/ML  FlexPen  Commonly known as:  NOVOLOG FLEXPEN  Inject 20 Units into the skin 3 (three) times daily with meals.     INSULIN SYRINGE 1CC/31GX5/16" 31G X 5/16" 1 ML Misc  Use to inject lantus daily     LANTUS 100 UNIT/ML injection  Generic drug:  insulin glargine  INJECT 0.5 MLS (50 UNITS TOTAL) INTO THE SKIN AT BEDTIME.     metFORMIN 500 MG 24 hr tablet  Commonly known as:  GLUCOPHAGE-XR  TAKE 1 TABLET (500 MG TOTAL) BY MOUTH 2 (TWO) TIMES DAILY WITH A MEAL.     quinapril 40 MG tablet  Commonly known as:  ACCUPRIL  Take 1 tablet (40 mg total) by mouth at bedtime.     tamsulosin 0.4 MG Caps capsule  Commonly known as:  FLOMAX  Take 0.4 mg by mouth at bedtime.        Allergies: No Known Allergies  Past Medical History  Diagnosis Date  . Diabetes mellitus   . Hypertension   . Hypercholesteremia     Past Surgical History  Procedure Laterality Date  . Cardiac catheterization      Family History  Problem Relation Age of Onset  . Heart disease Mother   . Diabetes Father   . Diabetes Sister    No colon cancer  Social History:  reports that he quit smoking about 17 years ago. He has never used smokeless tobacco. He reports that he does not drink alcohol or use illicit drugs.  Review of Systems:  Last eye exam 9/15, normal  HYPERTENSION:  he has had long-standing hypertension Is now taking HCTZ in addition to his 5 mg amlodipine  along with his Accupril 40 mg but he has not filled his Accupril consistently recently HCTZ was started in 4/16 when blood pressure was significantly high and associated with  epistaxis  HYPERLIPIDEMIA: The lipid abnormality consists of elevated LDL.  Previously had been irregular with taking his Lipitor because of fear of side effects.  Now he  is taking his Lipitor regularly at the 20 mg dose  Has had some evidence of CAD in the past but currently asymptomatic  Lab Results  Component Value Date   CHOL 158 01/31/2015   HDL 54.30  01/31/2015   LDLCALC 86 01/31/2015   LDLDIRECT 89.8 12/29/2013   TRIG 89.0 01/31/2015   CHOLHDL 3 01/31/2015    No history of numbness or tingling  in his feet but has some occasional sharp pains.   Currently not taking aspirin.  He was concerned about possible stomach ulceration from this      Examination:   BP 145/76 mmHg  Pulse 71  Temp(Src) 98.4 F (36.9 C)  Resp 14  Ht 5' 5.25" (1.657 m)  Wt 186 lb (84.369 kg)  BMI 30.73 kg/m2  SpO2 98%  Body mass index is 30.73 kg/(m^2).   No  edema  ASSESSMENT/ PLAN:   Diabetes type 2   The patient's diabetes control is overall not as good and difficult to assess that because he does not bring his monitor for download  See history of present illness for detailed discussion of his current management, blood sugar patterns and problems identified Also difficult to know what exactly his blood sugars are as he is giving some inconsistent answers and doubt if he is checking his blood sugar regularly after meals Most likely is having postprandial hyperglycemia at least after supper with readings frequently around 200 at bedtime Maybe also getting high readings after breakfast when he is drinking juice  Since he claimed that his fasting and suppertime readings are fairly good will continue the same dose of 50 units of Lantus at bedtime Most likely will need at least 5 more units to cover his evening meal unless eating small amount of carbohydrate  Given him a blood sugar diary to keep a record or at least 2 weeks prior to his next visit He needs to find out what Hassan Rowan monitor will covered by his insurance  HYPERTENSION: His blood pressure is under fair control, higher today with missing a dose of his Accupril  Preventive care: He will establish with a PCP  Flu vaccine given  Counseling time on subjects discussed above is over 50% of today's 25 minute visit   Tracee Mccreery 02/09/2015, 10:19 AM    No visits with results within 1  Day(s) from this visit. Latest known visit with results is:  Appointment on 01/31/2015  Component Date Value Ref Range Status  . Hgb A1c MFr Bld 01/31/2015 7.3* 4.6 - 6.5 % Final   Glycemic Control Guidelines for People with Diabetes:Non Diabetic:  <6%Goal of Therapy: <7%Additional Action Suggested:  >8%   . Microalb, Ur 01/31/2015 <0.7  0.0 - 1.9 mg/dL Final  . Creatinine,U 01/31/2015 43.4   Final  . Microalb Creat Ratio 01/31/2015 1.6  0.0 - 30.0 mg/g Final  . Sodium 01/31/2015 136  135 - 145 mEq/L Final  . Potassium 01/31/2015 4.1  3.5 - 5.1 mEq/L Final  . Chloride 01/31/2015 100  96 - 112 mEq/L Final  . CO2 01/31/2015 29  19 - 32 mEq/L Final  . Glucose, Bld 01/31/2015 112* 70 - 99 mg/dL Final  . BUN 01/31/2015 13  6 - 23 mg/dL Final  . Creatinine, Ser 01/31/2015 0.86  0.40 - 1.50 mg/dL Final  . Calcium 01/31/2015 9.7  8.4 - 10.5 mg/dL Final  . GFR 01/31/2015 116.68  >60.00 mL/min Final  . Cholesterol 01/31/2015 158  0 - 200 mg/dL Final   ATP III Classification       Desirable:  < 200 mg/dL               Borderline High:  200 - 239 mg/dL          High:  > = 240 mg/dL  . Triglycerides 01/31/2015 89.0  0.0 - 149.0 mg/dL Final   Normal:  <150 mg/dLBorderline High:  150 - 199  mg/dL  . HDL 01/31/2015 54.30  >39.00 mg/dL Final  . VLDL 01/31/2015 17.8  0.0 - 40.0 mg/dL Final  . LDL Cholesterol 01/31/2015 86  0 - 99 mg/dL Final  . Total CHOL/HDL Ratio 01/31/2015 3   Final                  Men          Women1/2 Average Risk     3.4          3.3Average Risk          5.0          4.42X Average Risk          9.6          7.13X Average Risk          15.0          11.0                      . NonHDL 01/31/2015 103.86   Final   NOTE:  Non-HDL goal should be 30 mg/dL higher than patient's LDL goal (i.e. LDL goal of < 70 mg/dL, would have non-HDL goal of < 100 mg/dL)

## 2015-02-10 ENCOUNTER — Other Ambulatory Visit: Payer: Self-pay | Admitting: Endocrinology

## 2015-02-11 ENCOUNTER — Other Ambulatory Visit: Payer: Self-pay | Admitting: Endocrinology

## 2015-02-21 ENCOUNTER — Other Ambulatory Visit: Payer: Self-pay | Admitting: *Deleted

## 2015-02-21 ENCOUNTER — Telehealth: Payer: Self-pay | Admitting: Endocrinology

## 2015-02-21 MED ORDER — QUINAPRIL HCL 40 MG PO TABS: 40.0000 mg | ORAL_TABLET | Freq: Every day | ORAL | Status: DC

## 2015-02-21 MED ORDER — "INSULIN SYRINGE 31G X 5/16"" 1 ML MISC": Status: DC

## 2015-02-21 MED ORDER — INSULIN ASPART 100 UNIT/ML FLEXPEN: 20.0000 [IU] | PEN_INJECTOR | Freq: Three times a day (TID) | SUBCUTANEOUS | Status: DC

## 2015-02-21 MED ORDER — METFORMIN HCL ER 500 MG PO TB24: ORAL_TABLET | ORAL | Status: DC

## 2015-02-21 MED ORDER — ATORVASTATIN CALCIUM 20 MG PO TABS: 20.0000 mg | ORAL_TABLET | Freq: Every day | ORAL | Status: DC

## 2015-02-21 MED ORDER — AMLODIPINE BESYLATE 5 MG PO TABS: 5.0000 mg | ORAL_TABLET | Freq: Every day | ORAL | Status: DC

## 2015-02-21 MED ORDER — CANAGLIFLOZIN 100 MG PO TABS: ORAL_TABLET | ORAL | Status: DC

## 2015-02-21 MED ORDER — INSULIN GLARGINE 100 UNIT/ML ~~LOC~~ SOLN: SUBCUTANEOUS | Status: DC

## 2015-02-21 MED ORDER — GLUCOSE BLOOD VI STRP: ORAL_STRIP | Status: DC

## 2015-02-21 MED ORDER — HYDROCHLOROTHIAZIDE 12.5 MG PO CAPS: 12.5000 mg | ORAL_CAPSULE | Freq: Every day | ORAL | Status: DC

## 2015-02-21 NOTE — Telephone Encounter (Signed)
[facility]

## 2015-02-21 NOTE — Telephone Encounter (Signed)
Patient called stating he would like his medications sent to mailorder   Please advise   Thank you

## 2015-02-27 ENCOUNTER — Other Ambulatory Visit: Payer: Self-pay

## 2015-02-27 MED ORDER — ATORVASTATIN CALCIUM 20 MG PO TABS: 20.0000 mg | ORAL_TABLET | Freq: Every day | ORAL | Status: DC

## 2015-02-27 MED ORDER — AMLODIPINE BESYLATE 5 MG PO TABS: 5.0000 mg | ORAL_TABLET | Freq: Every day | ORAL | Status: DC

## 2015-02-27 MED ORDER — HYDROCHLOROTHIAZIDE 12.5 MG PO CAPS: 12.5000 mg | ORAL_CAPSULE | Freq: Every day | ORAL | Status: DC

## 2015-05-06 ENCOUNTER — Other Ambulatory Visit: Payer: Self-pay | Admitting: Endocrinology

## 2015-05-07 ENCOUNTER — Telehealth: Payer: Self-pay | Admitting: Endocrinology

## 2015-05-07 NOTE — Telephone Encounter (Signed)
He can take OTC Alleve or Advil with food, if not better to see a PCP at [facility], needs to establish with one

## 2015-05-07 NOTE — Telephone Encounter (Signed)
Patient Name: [facility] Gender: Male DOB: August 10, 1955 Age: 60 Y 2 M 18 D Return Phone Number: [phone] (Primary) Address: City/State/Zip: [place] Client [facility] Night - Client Client Site [facility] Physician Elayne Snare Contact Type Call Call Type Triage / Clinical Caller Name Quinton Relationship To Patient Self Return Phone Number [phone] (Primary) Chief Complaint Back Pain - General Initial Comment Caller states I need to know what I can take with a back pain PreDisposition Did not know what to do Nurse Assessment Nurse: Mills-Hernandez, RN, Izora Gala Date/Time (Eastern Time): 05/06/2015 12:05:15 PM Confirm and document reason for call. If symptomatic, describe symptoms. ---Caller states that he is having back pain. He has a lot of pain when he moves around.,

## 2015-05-07 NOTE — Telephone Encounter (Signed)
Noted, message given to patient.

## 2015-05-17 ENCOUNTER — Ambulatory Visit: Payer: Self-pay | Admitting: Surgery

## 2015-05-17 ENCOUNTER — Telehealth: Payer: Self-pay | Admitting: Endocrinology

## 2015-05-17 ENCOUNTER — Other Ambulatory Visit: Payer: Self-pay | Admitting: *Deleted

## 2015-05-17 MED ORDER — INSULIN GLARGINE 100 UNIT/ML ~~LOC~~ SOLN: SUBCUTANEOUS | Status: DC

## 2015-05-17 NOTE — H&P (Signed)
Austin Norris (Austin Norris) [facility] 05/17/2015 10:37 AM Location: [facility] Patient #: [id number] DOB: 10/09/1955 Married / Language: English / Race: Refused to Report/Unreported Male  History of Present Illness Austin Norris Done MD; 05/17/2015 11:04 AM) Patient words: New-Umb Hernia.  The patient is a 60 year old male who presents with an umbilical hernia. Symptoms include bulge at the umbilicus. Onset was 15 year(s) ago. The patient describes this as worsening. Symptoms are exacerbated by straining and weight gain. The patient was previously evaluated by a primary physician. I explained laparoscopic and open combo repair probably with a Ventralex mesh. He understands the procedure and risks. He had a recent workup by Dr Donald Prose at [place]. His umbilicus is protruding the size of a golf ball with thinning of the umbilical skin. I gave him a copy of the hernia repair booklet. He seems to understand the procedure and risks.    Other Problems Austin Norris, CMA; 05/17/2015 10:37 AM) Diabetes Mellitus High blood pressure  Past Surgical History Austin Norris, CMA; 05/17/2015 10:37 AM) No pertinent past surgical history  Diagnostic Studies History Austin Norris, CMA; 05/17/2015 10:37 AM) Colonoscopy 1-5 years ago  Allergies Austin Norris, CMA; 05/17/2015 10:37 AM) No Known Drug Allergies 05/17/2015  Medication History (Austin Norris, CMA; 05/17/2015 10:39 AM) AmLODIPine Besylate (5MG  Tablet, Oral) Active. Atorvastatin Calcium (20MG  Tablet, Oral) Active. Invokana (100MG  Tablet, Oral) Active. HydroCHLOROthiazide (12.5MG  Tablet, Oral) Active. NovoLOG FlexPen (100UNIT/ML Soln Pen-inj, Subcutaneous) Active. Lantus (100UNIT/ML Solution, Subcutaneous) Active. Quinapril HCl (40MG  Tablet, Oral) Active. Tamsulosin HCl (0.4MG  Capsule, Oral) Active. Medications Reconciled  Social History Austin Norris, CMA; 05/17/2015 10:37 AM) No drug use Tobacco use Former  smoker.  Family History Austin Norris, CMA; 05/17/2015 10:37 AM) Diabetes Mellitus Father. Heart Disease Mother.     Review of Systems Austin Norris CMA; 05/17/2015 10:37 AM) General Not Present- Appetite Loss, Chills, Fatigue, Fever, Night Sweats, Weight Gain and Weight Loss. Skin Not Present- Change in Wart/Mole, Dryness, Hives, Jaundice, New Lesions, Non-Healing Wounds, Rash and Ulcer. HEENT Not Present- Earache, Hearing Loss, Hoarseness, Nose Bleed, Oral Ulcers, Ringing in the Ears, Seasonal Allergies, Sinus Pain, Sore Throat, Visual Disturbances, Wears glasses/contact lenses and Yellow Eyes.  Vitals (Austin Norris CMA; 05/17/2015 10:37 AM) 05/17/2015 10:37 AM Weight: 189.6 lb Height: 64in Body Surface Area: 1.91 m Body Mass Index: 32.54 kg/m  Temp.: 98.30F(Oral)  Pulse: 82 (Regular)  BP: 138/88 (Sitting, Left Arm, Standard)      Physical Exam (Brittne Kawasaki B. Norris Done MD; 05/17/2015 11:02 AM)  The physical exam findings are as follows:  HEENT unremarkable Neck supple Chest clear Heart SR without murmurs Abdomen-protuberant with golf ball sized mass protruding from the umbilical area centrally with attenuation of the overlying skin. Ext FROM Neuro alert and oriented x 3    Assessment & Plan Austin Norris Done MD; 05/17/2015 10:59 AM)  Diabetes Mellitus  High blood pressure  UMBILICAL HERNIA ([id number]) Impression: Will schedule umbilical hernia repair at [facility] --laparoscopic possible open repair. He is aware of the procedure and risks.

## 2015-05-17 NOTE — Telephone Encounter (Signed)
Patient need a refill of Lantus send to  [id number] - [place], [place] - [address] AT [address] [phone] (Phone) [phone] (Fax)

## 2015-05-17 NOTE — Telephone Encounter (Signed)
rx sent

## 2015-06-06 ENCOUNTER — Other Ambulatory Visit: Payer: No Typology Code available for payment source

## 2015-06-07 ENCOUNTER — Other Ambulatory Visit: Payer: Self-pay | Admitting: Endocrinology

## 2015-06-11 ENCOUNTER — Other Ambulatory Visit: Payer: Self-pay | Admitting: *Deleted

## 2015-06-11 ENCOUNTER — Ambulatory Visit (INDEPENDENT_AMBULATORY_CARE_PROVIDER_SITE_OTHER): Payer: BLUE CROSS/BLUE SHIELD | Admitting: Endocrinology

## 2015-06-11 ENCOUNTER — Encounter: Payer: Self-pay | Admitting: Endocrinology

## 2015-06-11 VITALS — BP 138/72 | HR 73 | Temp 98.0°F | Resp 16 | Ht 65.25 in | Wt 188.8 lb

## 2015-06-11 DIAGNOSIS — E118 Type 2 diabetes mellitus with unspecified complications: Secondary | ICD-10-CM | POA: Diagnosis not present

## 2015-06-11 DIAGNOSIS — Z794 Long term (current) use of insulin: Secondary | ICD-10-CM

## 2015-06-11 DIAGNOSIS — E1165 Type 2 diabetes mellitus with hyperglycemia: Secondary | ICD-10-CM

## 2015-06-11 DIAGNOSIS — I10 Essential (primary) hypertension: Secondary | ICD-10-CM

## 2015-06-11 DIAGNOSIS — IMO0002 Reserved for concepts with insufficient information to code with codable children: Secondary | ICD-10-CM

## 2015-06-11 LAB — POCT GLYCOSYLATED HEMOGLOBIN (HGB A1C): HEMOGLOBIN A1C: 7.6

## 2015-06-11 MED ORDER — CANAGLIFLOZIN 300 MG PO TABS: 300.0000 mg | ORAL_TABLET | Freq: Every day | ORAL | Status: DC

## 2015-06-11 MED ORDER — METFORMIN HCL ER 500 MG PO TB24: ORAL_TABLET | ORAL | Status: DC

## 2015-06-11 NOTE — Progress Notes (Signed)
Patient ID: Austin Norris, male   DOB: 06/18/1955, 60 y.o.   MRN: [id number]   Reason for Appointment: Diabetes follow-up   History of Present Illness   Diagnosis: Type 2 DIABETES MELITUS, long-standing     He has been on insulin to treat his diabetes for several years partly because of failure of oral agents and also for reducing cost of medications He is generally checking his blood sugars once or twice a day but does not keep a record Overall has had somewhat poor control with A1c consistently over 7%  Recent history:    Insulin regimen: Lantus  50 units at 7pm, NovoLog 25-30 before supper  His A1c is gradually increasing and now 7.6 He was somewhat confused about his medications and has not taken metformin since last visit He has been on Invokana since at least 2015 in addition to his insulin regimen He has finally started using the brand name FreeStyle status strips  Current blood sugar patterns, management and problems identified:  His glucose monitor is about 3 hours behind and difficult analyze his  download  Fasting readings: Have been relatively better recently although have been inconsistent He has had 2 episodes of hypoglycemia overnight about midnight-1 AM MEALTIME insulin: He is only taking Novolog at suppertime and not at breakfast or lunch, previously was taking somewhat breakfast also but has stopped this because of some tendency to hypoglycemia after going to work  Postprandial readings: He is checking his blood sugar only rarely at lunchtime and this is not appearing high BEFORE dinner time blood sugars are quite variable Blood sugars AFTER evening meal and mostly high but not consistent and has a couple of low sugars after midnight,  not clear which readings are after meals but has some readings over 200 and highest 313  Hypoglycemia: This will happen only occasionally as above  Oral hypoglycemic drugs: Invokana 100 mg     Side effects from  medications: None  Proper timing of medications in relation to meals: Yes.          Monitors blood glucose:  at least  Once a day.    Glucometer:   FreeStyle  Blood Glucose readings from download:  Mean values apply above for all meters except median for One Touch  PRE-MEAL Fasting Lunch Dinner Bedtime Overall  Glucose range:  72-292    109-272   69-313    Mean/median:      174+/-73    POST-MEAL PC Breakfast PC Lunch PC Dinner  Glucose range: ?  97, 390     Mean/median:             Meals: 1-2 meals per day, usually bread and milk in the morning, relatively smaller lunch, sometimes having eggs at lunchtime., sometimes sweets in pm, fried food occasionally        Dinner usually 5-6 PM   Physical activity: exercise: Mostly walking at work, is active in the parking lot of his car [place] visit: Most recent: None             Wt Readings from Last 3 Encounters:  06/11/15 188 lb 12.8 oz (85.639 kg)  02/08/15 186 lb (84.369 kg)  08/31/14 182 lb 9.6 oz (82.827 kg)   LABS:  Lab Results  Component Value Date   HGBA1C 7.6 06/11/2015   HGBA1C 7.3* 01/31/2015   HGBA1C 6.8* 08/03/2014   Lab Results  Component  Value Date   MICROALBUR <0.7 01/31/2015   LDLCALC 86 01/31/2015   CREATININE 0.86 01/31/2015        Medication List       This list is accurate as of: 06/11/15 11:59 PM.  Always use your most recent med list.               amLODipine 5 MG tablet  Commonly known as:  NORVASC  TAKE 1 TABLET BY MOUTH EVERY DAY     aspirin 81 MG tablet  Take 81 mg by mouth daily.     atorvastatin 20 MG tablet  Commonly known as:  LIPITOR  Take 1 tablet (20 mg total) by mouth daily.     canagliflozin 300 MG Tabs tablet  Commonly known as:  INVOKANA  Take 1 tablet (300 mg total) by mouth daily before breakfast.     glucose blood test strip  Commonly known as:  FREESTYLE LITE  Use as instructed to check blood sugar 2 times a day      hydrochlorothiazide 12.5 MG capsule  Commonly known as:  MICROZIDE  Take 1 capsule (12.5 mg total) by mouth daily.     insulin aspart 100 UNIT/ML FlexPen  Commonly known as:  NOVOLOG FLEXPEN  Inject 20 Units into the skin 3 (three) times daily with meals.     insulin glargine 100 UNIT/ML injection  Commonly known as:  LANTUS  INJECT 0.5 MLS (50 UNITS TOTAL) INTO THE SKIN AT BEDTIME.     INSULIN SYRINGE 1CC/31GX5/16" 31G X 5/16" 1 ML Misc  Use to inject lantus daily     metFORMIN 500 MG 24 hr tablet  Commonly known as:  GLUCOPHAGE-XR  Take 2 tablets daily with Dinner.     quinapril 40 MG tablet  Commonly known as:  ACCUPRIL  Take 1 tablet (40 mg total) by mouth at bedtime.     tamsulosin 0.4 MG Caps capsule  Commonly known as:  FLOMAX  Take 0.4 mg by mouth at bedtime.        Allergies: No Known Allergies  Past Medical History  Diagnosis Date  . Diabetes mellitus   . Hypertension   . Hypercholesteremia     Past Surgical History  Procedure Laterality Date  . Cardiac catheterization      Family History  Problem Relation Age of Onset  . Heart disease Mother   . Diabetes Father   . Diabetes Sister    No colon cancer  Social History:  reports that he quit smoking about 18 years ago. He has never used smokeless tobacco. He reports that he does not drink alcohol or use illicit drugs.  Review of Systems:  Last eye exam 9/15, normal  HYPERTENSION:  he has had long-standing hypertension Is  taking HCTZ in addition to his 5 mg amlodipine  along with his Accupril 40 mg HCTZ was started in 4/16 when blood pressure was significantly high and associated with  epistaxis  HYPERLIPIDEMIA: The lipid abnormality consists of elevated LDL.  Previously had been irregular with taking his Lipitor because of fear of side effects.  Now he  is taking his Lipitor regularly at the 20 mg dose  Has had some evidence of CAD in the past but currently asymptomatic  Lab Results    Component Value Date   CHOL 158 01/31/2015   HDL 54.30 01/31/2015   LDLCALC 86 01/31/2015   LDLDIRECT 89.8 12/29/2013   TRIG 89.0 01/31/2015   CHOLHDL 3 01/31/2015  Examination:   BP 138/72 mmHg  Pulse 73  Temp(Src) 98 F (36.7 C)  Resp 16  Ht 5' 5.25" (1.657 m)  Wt 188 lb 12.8 oz (85.639 kg)  BMI 31.19 kg/m2  SpO2 95%  Body mass index is 31.19 kg/(m^2).    ASSESSMENT/ PLAN:   Diabetes type 2   See history of present illness for detailed discussion of his current management, blood sugar patterns and problems identified He has had significant fluctuation of his blood glucose levels after meals and some overnight also Some of his overnight fluctuation may be related to variable fluid intake, and snacks as well as inconsistent effect of Lantus insulin  Currently taking insulin only with mealtime coverage at suppertime and basal insulin about 7 PM Also may be having higher readings with living off METFORMIN He clearly has significant hyperglycemia after lunch especially on weekends when he is not as active and has not taken any mealtime doses for this  RECOMMENDATIONS:  Take Lantus at 9 PM which is bedtime  Will need to check coverage of Tresiba or Toujeo  Increase Invokana to 300 mg  Start taking at least 15 units of Novolog at lunchtime when not very active or working  Protein with every breakfast and lunch meals  More blood sugars about 2 hours after breakfast or lunch and some after supper consistently  His monitor was changed to the correct time today  If eating a lighter meal in the evening have a bedtime snack.  Especially if bedtime readings are below 120  HYPERTENSION: His blood pressure is under fair control He was given a new blood pressure monitor for home use Will need to monitor his blood pressure with the large office  cuff No change in medications as yet   Counseling time on subjects discussed above is over 50% of today's 25 minute  visit  Patient Instructions  Check blood sugars on waking up 3  times a week Also check blood sugars about 2 hours after a meal and do this after different meals by rotation  Recommended blood sugar levels on waking up is 90-130 and about 2 hours after meal is 130-160  Please bring your blood sugar monitor to each visit, thank you  Take Lantus at bedtime  Take 15 Novolog  insulin at lunch on weekends and if eating more bread on weekdays  2 invokana daily, next rx 300mg    Take 2 metformin at dinner     Treydon Henricks 06/12/2015, 9:00 AM   Note: This office note was prepared with Estate agent. Any transcriptional errors that result from this process are unintentional.

## 2015-06-11 NOTE — Patient Instructions (Addendum)
Check blood sugars on waking up 3  times a week Also check blood sugars about 2 hours after a meal and do this after different meals by rotation  Recommended blood sugar levels on waking up is 90-130 and about 2 hours after meal is 130-160  Please bring your blood sugar monitor to each visit, thank you  Take Lantus at bedtime  Take 15 Novolog  insulin at lunch on weekends and if eating more bread on weekdays  2 invokana daily, next rx 300mg    Take 2 metformin at dinner

## 2015-06-12 LAB — BASIC METABOLIC PANEL
BUN: 16 mg/dL (ref 6–23)
CALCIUM: 9.9 mg/dL (ref 8.4–10.5)
CHLORIDE: 102 meq/L (ref 96–112)
CO2: 29 meq/L (ref 19–32)
CREATININE: 0.93 mg/dL (ref 0.40–1.50)
GFR: 106.47 mL/min (ref >60.00)
GLUCOSE: 143 mg/dL — AB (ref 70–99)
Potassium: 4.1 mEq/L (ref 3.5–5.1)
Sodium: 139 mEq/L (ref 135–145)

## 2015-07-03 ENCOUNTER — Other Ambulatory Visit: Payer: Self-pay | Admitting: Endocrinology

## 2015-07-05 ENCOUNTER — Encounter (HOSPITAL_COMMUNITY): Payer: Self-pay

## 2015-07-05 NOTE — Patient Instructions (Addendum)
YOUR PROCEDURE IS SCHEDULED ON :  07/11/15  REPORT TO [facility] MAIN ENTRANCE FOLLOW SIGNS TO EAST ELEVATOR - GO TO 3rd FLOOR CHECK IN AT 3 EAST NURSES STATION (SHORT STAY) AT:  10:15 AM  CALL THIS NUMBER IF YOU HAVE PROBLEMS THE MORNING OF SURGERY [phone]  REMEMBER:ONLY 1 PER PERSON MAY GO TO SHORT STAY WITH YOU TO GET READY THE MORNING OF YOUR SURGERY  DO NOT EAT FOOD OR DRINK LIQUIDS AFTER MIDNIGHT  STOP ASPIRIN / IBUPROFEN / ALEVE / VITAMINS / HERBAL MEDS __5__ DAYS BEFORE SURGERY  TAKE THESE MEDICINES THE MORNING OF SURGERY: NONE TAKE 1/2 DOSE (15 UNITS) LANTUS INSULIN THE NIGHT BEFORE SURGERY  YOU MAY NOT HAVE ANY METAL ON YOUR BODY INCLUDING HAIR PINS AND PIERCING'S. DO NOT WEAR JEWELRY, MAKEUP, LOTIONS, POWDERS OR PERFUMES. DO NOT WEAR NAIL POLISH. DO NOT SHAVE 48 HRS PRIOR TO SURGERY. MEN MAY SHAVE FACE AND NECK.  DO NOT [place]. [facility] IS NOT RESPONSIBLE FOR VALUABLES.  CONTACTS, DENTURES OR PARTIALS MAY NOT BE WORN TO SURGERY. LEAVE SUITCASE IN CAR. CAN BE BROUGHT TO ROOM AFTER SURGERY.  PATIENTS DISCHARGED THE DAY OF SURGERY WILL NOT BE ALLOWED TO DRIVE HOME.  PLEASE READ OVER THE FOLLOWING INSTRUCTION SHEETS _________________________________________________________________________________                                          [facility] - PREPARING FOR SURGERY  Before surgery, you can play an important role.  Because skin is not sterile, your skin needs to be as free of germs as possible.  You can reduce the number of germs on your skin by washing with CHG (chlorahexidine gluconate) soap before surgery.  CHG is an antiseptic cleaner which kills germs and bonds with the skin to continue killing germs even after washing. Please DO NOT use if you have an allergy to CHG or antibacterial soaps.  If your skin becomes reddened/irritated stop using the CHG and inform your nurse when you arrive at Short Stay. Do not shave  (including legs and underarms) for at least 48 hours prior to the first CHG shower.  You may shave your face. Please follow these instructions carefully:   1.  Shower with CHG Soap the night before surgery and the  morning of Surgery.   2.  If you choose to wash your hair, wash your hair first as usual with your  normal  Shampoo.   3.  After you shampoo, rinse your hair and body thoroughly to remove the  shampoo.                                         4.  Use CHG as you would any other liquid soap.  You can apply chg directly  to the skin and wash . Gently wash with scrungie or clean wascloth    5.  Apply the CHG Soap to your body ONLY FROM THE NECK DOWN.   Do not use on open                           Wound or open sores. Avoid contact with eyes, ears mouth and genitals (private parts).  Genitals (private parts) with your normal soap.              6.  Wash thoroughly, paying special attention to the area where your surgery  will be performed.   7.  Thoroughly rinse your body with warm water from the neck down.   8.  DO NOT shower/wash with your normal soap after using and rinsing off  the CHG Soap .                9.  Pat yourself dry with a clean towel.             10.  Wear clean night clothes to bed after shower             11.  Place clean sheets on your bed the night of your first shower and do not  sleep with pets.  Day of Surgery : Do not apply any lotions/deodorants the morning of surgery.  Please wear clean clothes to the hospital/surgery center.  FAILURE TO FOLLOW THESE INSTRUCTIONS MAY RESULT IN THE CANCELLATION OF YOUR SURGERY    PATIENT SIGNATURE_________________________________  ______________________________________________________________________

## 2015-07-06 ENCOUNTER — Encounter (HOSPITAL_COMMUNITY): Payer: Self-pay

## 2015-07-06 ENCOUNTER — Encounter (HOSPITAL_COMMUNITY)
Admission: RE | Admit: 2015-07-06 | Discharge: 2015-07-06 | Disposition: A | Discharge status: Home / Self Care | Payer: BLUE CROSS/BLUE SHIELD | Source: Ambulatory Visit | Attending: Surgery | Admitting: Surgery

## 2015-07-06 DIAGNOSIS — Z01818 Encounter for other preprocedural examination: Secondary | ICD-10-CM | POA: Insufficient documentation

## 2015-07-06 DIAGNOSIS — K429 Umbilical hernia without obstruction or gangrene: Secondary | ICD-10-CM | POA: Diagnosis not present

## 2015-07-06 DIAGNOSIS — Z01812 Encounter for preprocedural laboratory examination: Secondary | ICD-10-CM | POA: Diagnosis not present

## 2015-07-06 DIAGNOSIS — I10 Essential (primary) hypertension: Secondary | ICD-10-CM | POA: Insufficient documentation

## 2015-07-06 HISTORY — DX: Personal history of other specified conditions: Z87.898

## 2015-07-06 HISTORY — DX: Personal history of peptic ulcer disease: Z87.11

## 2015-07-06 HISTORY — DX: Pain in left shoulder: M25.512

## 2015-07-06 HISTORY — DX: Umbilical hernia without obstruction or gangrene: K42.9

## 2015-07-06 HISTORY — DX: Personal history of other diseases of the digestive system: Z87.19

## 2015-07-06 LAB — BASIC METABOLIC PANEL
Anion gap: 12 (ref 5–15)
BUN: 11 mg/dL (ref 6–20)
CO2: 25 mmol/L (ref 22–32)
Calcium: 9.5 mg/dL (ref 8.9–10.3)
Chloride: 102 mmol/L (ref 101–111)
Creatinine, Ser: 0.78 mg/dL (ref 0.61–1.24)
GFR calc Af Amer: >60 mL/min (ref >60)
GLUCOSE: 134 mg/dL — AB (ref 65–99)
Potassium: 3.9 mmol/L (ref 3.5–5.1)
Sodium: 139 mmol/L (ref 135–145)

## 2015-07-06 LAB — CBC
HEMATOCRIT: 48.3 % (ref 39.0–52.0)
Hemoglobin: 15.7 g/dL (ref 13.0–17.0)
MCH: 29.1 pg (ref 26.0–34.0)
MCHC: 32.5 g/dL (ref 30.0–36.0)
MCV: 89.4 fL (ref 78.0–100.0)
Platelets: 244 10*3/uL (ref 150–400)
RBC: 5.4 MIL/uL (ref 4.22–5.81)
RDW: 13.8 % (ref 11.5–15.5)
WBC: 4.2 10*3/uL (ref 4.0–10.5)

## 2015-07-07 LAB — HEMOGLOBIN A1C
HEMOGLOBIN A1C: 7.6 % — AB (ref 4.8–5.6)
MEAN PLASMA GLUCOSE: 171 mg/dL

## 2015-07-10 NOTE — Anesthesia Preprocedure Evaluation (Addendum)
Anesthesia Evaluation  Patient identified by MRN, date of birth, ID band Patient awake    Reviewed: Allergy & Precautions, NPO status , Patient's Chart, lab work & pertinent test results  Airway Mallampati: II  TM Distance: >3 FB Neck ROM: Full    Dental no notable dental hx.    Pulmonary neg pulmonary ROS, former smoker,    Pulmonary exam normal breath sounds clear to auscultation       Cardiovascular hypertension, Pt. on medications Normal cardiovascular exam Rhythm:Regular Rate:Normal     Neuro/Psych negative neurological ROS  negative psych ROS   GI/Hepatic negative GI ROS, Neg liver ROS,   Endo/Other  diabetes, Insulin Dependent  Renal/GU negative Renal ROS  negative genitourinary   Musculoskeletal negative musculoskeletal ROS (+)   Abdominal   Peds negative pediatric ROS (+)  Hematology negative hematology ROS (+)   Anesthesia Other Findings   Reproductive/Obstetrics negative OB ROS                            Anesthesia Physical Anesthesia Plan  ASA: III  Anesthesia Plan: General   Post-op Pain Management:    Induction: Intravenous  Airway Management Planned: Oral ETT  Additional Equipment:   Intra-op Plan:   Post-operative Plan: Extubation in OR  Informed Consent: I have reviewed the patients History and Physical, chart, labs and discussed the procedure including the risks, benefits and alternatives for the proposed anesthesia with the patient or authorized representative who has indicated his/her understanding and acceptance.   Dental advisory given  Plan Discussed with: CRNA and Surgeon  Anesthesia Plan Comments:        Anesthesia Quick Evaluation

## 2015-07-10 NOTE — Progress Notes (Signed)
Pt aware of surgical time change and to arrive at Short Stay [facility] at 6:30am. No food or drink after midnight. Also let Maudie Mercury know in Short Stay of change.

## 2015-07-11 ENCOUNTER — Encounter (HOSPITAL_COMMUNITY): Payer: Self-pay | Admitting: *Deleted

## 2015-07-11 ENCOUNTER — Ambulatory Visit (HOSPITAL_COMMUNITY): Payer: BLUE CROSS/BLUE SHIELD | Admitting: Certified Registered Nurse Anesthetist

## 2015-07-11 ENCOUNTER — Encounter (HOSPITAL_COMMUNITY)
Admission: RE | Disposition: A | Discharge status: Home / Self Care | Payer: Self-pay | Source: Ambulatory Visit | Attending: Surgery

## 2015-07-11 ENCOUNTER — Ambulatory Visit (HOSPITAL_COMMUNITY)
Admission: RE | Admit: 2015-07-11 | Discharge: 2015-07-11 | Disposition: A | Discharge status: Home / Self Care | Payer: BLUE CROSS/BLUE SHIELD | Source: Ambulatory Visit | Attending: Surgery | Admitting: Surgery

## 2015-07-11 DIAGNOSIS — I10 Essential (primary) hypertension: Secondary | ICD-10-CM | POA: Diagnosis not present

## 2015-07-11 DIAGNOSIS — Z87891 Personal history of nicotine dependence: Secondary | ICD-10-CM | POA: Diagnosis not present

## 2015-07-11 DIAGNOSIS — K420 Umbilical hernia with obstruction, without gangrene: Secondary | ICD-10-CM | POA: Diagnosis present

## 2015-07-11 DIAGNOSIS — E119 Type 2 diabetes mellitus without complications: Secondary | ICD-10-CM | POA: Insufficient documentation

## 2015-07-11 DIAGNOSIS — Z794 Long term (current) use of insulin: Secondary | ICD-10-CM | POA: Insufficient documentation

## 2015-07-11 DIAGNOSIS — Z79899 Other long term (current) drug therapy: Secondary | ICD-10-CM | POA: Diagnosis not present

## 2015-07-11 HISTORY — PX: UMBILICAL HERNIA REPAIR: SHX196

## 2015-07-11 LAB — GLUCOSE, CAPILLARY
GLUCOSE-CAPILLARY: 117 mg/dL — AB (ref 65–99)
GLUCOSE-CAPILLARY: 143 mg/dL — AB (ref 65–99)
Glucose-Capillary: 142 mg/dL — ABNORMAL HIGH (ref 65–99)

## 2015-07-11 SURGERY — REPAIR, HERNIA, UMBILICAL, LAPAROSCOPIC
Anesthesia: General

## 2015-07-11 MED ORDER — FENTANYL CITRATE (PF) 100 MCG/2ML IJ SOLN
INTRAMUSCULAR | Status: DC | PRN
  Administered 2015-07-11 (×2): 50 ug via INTRAVENOUS
  Administered 2015-07-11 (×2): 100 ug via INTRAVENOUS
  Administered 2015-07-11: 50 ug via INTRAVENOUS

## 2015-07-11 MED ORDER — OXYCODONE HCL 5 MG PO TABS
5.0000 mg | ORAL_TABLET | ORAL | Status: DC | PRN
  Administered 2015-07-11: 5 mg via ORAL
  Filled 2015-07-11: qty 1

## 2015-07-11 MED ORDER — 0.9 % SODIUM CHLORIDE (POUR BTL) OPTIME
TOPICAL | Status: DC | PRN
  Administered 2015-07-11: 1000 mL

## 2015-07-11 MED ORDER — ACETAMINOPHEN 650 MG RE SUPP
650.0000 mg | RECTAL | Status: DC | PRN
  Filled 2015-07-11: qty 1

## 2015-07-11 MED ORDER — SODIUM CHLORIDE 0.9 % IV SOLN: 250.0000 mL | INTRAVENOUS | Status: DC | PRN

## 2015-07-11 MED ORDER — HYDROMORPHONE HCL 1 MG/ML IJ SOLN
0.2500 mg | INTRAMUSCULAR | Status: DC | PRN
  Administered 2015-07-11 (×4): 0.5 mg via INTRAVENOUS

## 2015-07-11 MED ORDER — HYDROMORPHONE HCL 2 MG/ML IJ SOLN
INTRAMUSCULAR | Status: AC
  Filled 2015-07-11: qty 1

## 2015-07-11 MED ORDER — LACTATED RINGERS IV SOLN
INTRAVENOUS | Status: DC | PRN
  Administered 2015-07-11: 08:00:00 via INTRAVENOUS

## 2015-07-11 MED ORDER — HYDROMORPHONE HCL 1 MG/ML IJ SOLN
INTRAMUSCULAR | Status: AC
  Filled 2015-07-11: qty 1

## 2015-07-11 MED ORDER — GLYCOPYRROLATE 0.2 MG/ML IJ SOLN
INTRAMUSCULAR | Status: DC | PRN
  Administered 2015-07-11: 0.2 mg via INTRAVENOUS

## 2015-07-11 MED ORDER — DEXAMETHASONE SODIUM PHOSPHATE 10 MG/ML IJ SOLN
INTRAMUSCULAR | Status: DC | PRN
  Administered 2015-07-11: 5 mg via INTRAVENOUS

## 2015-07-11 MED ORDER — CHLORHEXIDINE GLUCONATE 4 % EX LIQD: 1.0000 "application " | Freq: Once | CUTANEOUS | Status: DC

## 2015-07-11 MED ORDER — ACETAMINOPHEN 325 MG PO TABS: 650.0000 mg | ORAL_TABLET | ORAL | Status: DC | PRN

## 2015-07-11 MED ORDER — SODIUM CHLORIDE 0.9 % IJ SOLN
INTRAMUSCULAR | Status: DC | PRN
  Administered 2015-07-11: 10 mL

## 2015-07-11 MED ORDER — PROPOFOL 10 MG/ML IV BOLUS
INTRAVENOUS | Status: AC
  Filled 2015-07-11: qty 20

## 2015-07-11 MED ORDER — ONDANSETRON HCL 4 MG/2ML IJ SOLN
INTRAMUSCULAR | Status: DC | PRN
  Administered 2015-07-11: 4 mg via INTRAVENOUS

## 2015-07-11 MED ORDER — CEFAZOLIN SODIUM-DEXTROSE 2-3 GM-% IV SOLR
INTRAVENOUS | Status: AC
Start: 2015-07-11 — End: 2015-07-11
  Filled 2015-07-11: qty 50

## 2015-07-11 MED ORDER — BUPIVACAINE LIPOSOME 1.3 % IJ SUSP
20.0000 mL | Freq: Once | INTRAMUSCULAR | Status: AC
  Administered 2015-07-11: 20 mL
  Filled 2015-07-11: qty 20

## 2015-07-11 MED ORDER — FENTANYL CITRATE (PF) 250 MCG/5ML IJ SOLN
INTRAMUSCULAR | Status: AC
Start: 2015-07-11 — End: 2015-07-11
  Filled 2015-07-11: qty 5

## 2015-07-11 MED ORDER — SODIUM CHLORIDE 0.9 % IJ SOLN
INTRAMUSCULAR | Status: AC
Start: 2015-07-11 — End: 2015-07-11
  Filled 2015-07-11: qty 10

## 2015-07-11 MED ORDER — ROCURONIUM BROMIDE 100 MG/10ML IV SOLN
INTRAVENOUS | Status: DC | PRN
Start: 2015-07-11 — End: 2015-07-11
  Administered 2015-07-11: 40 mg via INTRAVENOUS

## 2015-07-11 MED ORDER — BUPIVACAINE LIPOSOME 1.3 % IJ SUSP
20.0000 mL | Freq: Once | INTRAMUSCULAR | Status: DC
  Filled 2015-07-11: qty 20

## 2015-07-11 MED ORDER — LACTATED RINGERS IV SOLN: INTRAVENOUS | Status: DC

## 2015-07-11 MED ORDER — SUCCINYLCHOLINE CHLORIDE 20 MG/ML IJ SOLN
INTRAMUSCULAR | Status: DC | PRN
  Administered 2015-07-11: 100 mg via INTRAVENOUS
  Administered 2015-07-11: 40 mg via INTRAVENOUS

## 2015-07-11 MED ORDER — FENTANYL CITRATE (PF) 100 MCG/2ML IJ SOLN
INTRAMUSCULAR | Status: AC
Start: 2015-07-11 — End: 2015-07-11
  Filled 2015-07-11: qty 2

## 2015-07-11 MED ORDER — LIDOCAINE HCL (CARDIAC) 20 MG/ML IV SOLN
INTRAVENOUS | Status: DC | PRN
  Administered 2015-07-11: 100 mg via INTRAVENOUS

## 2015-07-11 MED ORDER — DEXAMETHASONE SODIUM PHOSPHATE 10 MG/ML IJ SOLN
INTRAMUSCULAR | Status: AC
Start: 2015-07-11 — End: 2015-07-11
  Filled 2015-07-11: qty 1

## 2015-07-11 MED ORDER — BACITRACIN-NEOMYCIN-POLYMYXIN 400-5-5000 EX OINT
TOPICAL_OINTMENT | CUTANEOUS | Status: AC
  Filled 2015-07-11: qty 1

## 2015-07-11 MED ORDER — CEFAZOLIN SODIUM-DEXTROSE 2-3 GM-% IV SOLR
2.0000 g | INTRAVENOUS | Status: AC
  Administered 2015-07-11: 2 g via INTRAVENOUS

## 2015-07-11 MED ORDER — HEPARIN SODIUM (PORCINE) 5000 UNIT/ML IJ SOLN
5000.0000 [IU] | Freq: Once | INTRAMUSCULAR | Status: AC
  Administered 2015-07-11: 5000 [IU] via SUBCUTANEOUS
  Filled 2015-07-11: qty 1

## 2015-07-11 MED ORDER — SUGAMMADEX SODIUM 200 MG/2ML IV SOLN
INTRAVENOUS | Status: DC | PRN
  Administered 2015-07-11: 200 mg via INTRAVENOUS

## 2015-07-11 MED ORDER — MIDAZOLAM HCL 2 MG/2ML IJ SOLN
INTRAMUSCULAR | Status: AC
Start: 2015-07-11 — End: 2015-07-11
  Filled 2015-07-11: qty 2

## 2015-07-11 MED ORDER — PROPOFOL 10 MG/ML IV BOLUS
INTRAVENOUS | Status: DC | PRN
  Administered 2015-07-11: 200 mg via INTRAVENOUS

## 2015-07-11 MED ORDER — SODIUM CHLORIDE 0.9% FLUSH: 3.0000 mL | INTRAVENOUS | Status: DC | PRN

## 2015-07-11 MED ORDER — SODIUM CHLORIDE 0.9 % IJ SOLN
INTRAMUSCULAR | Status: AC
  Filled 2015-07-11: qty 10

## 2015-07-11 MED ORDER — MEPERIDINE HCL 50 MG/ML IJ SOLN: 6.2500 mg | INTRAMUSCULAR | Status: DC | PRN

## 2015-07-11 MED ORDER — HYDROCODONE-ACETAMINOPHEN 5-325 MG PO TABS: 1.0000 | ORAL_TABLET | ORAL | Status: DC | PRN

## 2015-07-11 MED ORDER — ROCURONIUM BROMIDE 100 MG/10ML IV SOLN
INTRAVENOUS | Status: AC
Start: 2015-07-11 — End: 2015-07-11
  Filled 2015-07-11: qty 1

## 2015-07-11 MED ORDER — LIDOCAINE HCL (CARDIAC) 20 MG/ML IV SOLN
INTRAVENOUS | Status: AC
  Filled 2015-07-11: qty 5

## 2015-07-11 MED ORDER — ONDANSETRON HCL 4 MG/2ML IJ SOLN
INTRAMUSCULAR | Status: AC
  Filled 2015-07-11: qty 2

## 2015-07-11 MED ORDER — SODIUM CHLORIDE 0.9% FLUSH: 3.0000 mL | Freq: Two times a day (BID) | INTRAVENOUS | Status: DC

## 2015-07-11 MED ORDER — MIDAZOLAM HCL 5 MG/5ML IJ SOLN
INTRAMUSCULAR | Status: DC | PRN
  Administered 2015-07-11: 2 mg via INTRAVENOUS

## 2015-07-11 MED ORDER — PROMETHAZINE HCL 25 MG/ML IJ SOLN: 6.2500 mg | INTRAMUSCULAR | Status: DC | PRN

## 2015-07-11 SURGICAL SUPPLY — 51 items
BALL CTTN LRG ABS STRL LF (GAUZE/BANDAGES/DRESSINGS) ×1
BALL CTTN STRL GZE (GAUZE/BANDAGES/DRESSINGS) ×1
BINDER ABDOMINAL 12 ML 46-62 (SOFTGOODS) ×1 IMPLANT
COTTON BALL STERILE (GAUZE/BANDAGES/DRESSINGS) ×2
COTTON BALL STERILE 2 PK (GAUZE/BANDAGES/DRESSINGS) IMPLANT
COTTONBALL LRG STERILE PKG (GAUZE/BANDAGES/DRESSINGS) ×1 IMPLANT
COVER SURGICAL LIGHT HANDLE (MISCELLANEOUS) ×2 IMPLANT
DECANTER SPIKE VIAL GLASS SM (MISCELLANEOUS) ×2 IMPLANT
DEVICE SECURE STRAP 25 ABSORB (INSTRUMENTS) IMPLANT
DEVICE TROCAR PUNCTURE CLOSURE (ENDOMECHANICALS) ×1 IMPLANT
DISSECTOR BLUNT TIP ENDO 5MM (MISCELLANEOUS) IMPLANT
DRAIN CHANNEL 19F RND (DRAIN) IMPLANT
DRAPE LAPAROSCOPIC ABDOMINAL (DRAPES) ×2 IMPLANT
DRSG TEGADERM 8X12 (GAUZE/BANDAGES/DRESSINGS) ×1 IMPLANT
ELECT PENCIL ROCKER SW 15FT (MISCELLANEOUS) ×2 IMPLANT
ELECT REM PT RETURN 9FT ADLT (ELECTROSURGICAL) ×2
ELECTRODE REM PT RTRN 9FT ADLT (ELECTROSURGICAL) ×1 IMPLANT
EVACUATOR SILICONE 100CC (DRAIN) IMPLANT
GLOVE BIO SURGEON STRL SZ 6.5 (GLOVE) ×2 IMPLANT
GLOVE BIOGEL M 8.0 STRL (GLOVE) ×2 IMPLANT
GLOVE BIOGEL PI IND STRL 6.5 (GLOVE) IMPLANT
GLOVE BIOGEL PI INDICATOR 6.5 (GLOVE) ×2
GOWN SPEC L4 XLG W/TWL (GOWN DISPOSABLE) ×2 IMPLANT
GOWN STRL REUS W/TWL XL LVL3 (GOWN DISPOSABLE) ×6 IMPLANT
KIT BASIN OR (CUSTOM PROCEDURE TRAY) ×2 IMPLANT
LIQUID BAND (GAUZE/BANDAGES/DRESSINGS) ×1 IMPLANT
MARKER SKIN DUAL TIP RULER LAB (MISCELLANEOUS) ×1 IMPLANT
MESH VENTRALEX ST 1-7/10 CRC S (Mesh General) ×1 IMPLANT
NDL SPNL 22GX3.5 QUINCKE BK (NEEDLE) IMPLANT
NEEDLE SPNL 22GX3.5 QUINCKE BK (NEEDLE) IMPLANT
PAD POSITIONING PINK XL (MISCELLANEOUS) ×1 IMPLANT
POSITIONER SURGICAL ARM (MISCELLANEOUS) IMPLANT
SET IRRIG TUBING LAPAROSCOPIC (IRRIGATION / IRRIGATOR) IMPLANT
SHEARS HARMONIC ACE PLUS 36CM (ENDOMECHANICALS) ×2 IMPLANT
SLEEVE XCEL OPT CAN 5 100 (ENDOMECHANICALS) ×4 IMPLANT
SOLUTION ANTI FOG 6CC (MISCELLANEOUS) ×2 IMPLANT
STAPLER VISISTAT 35W (STAPLE) ×1 IMPLANT
STRIP CLOSURE SKIN 1/2X4 (GAUZE/BANDAGES/DRESSINGS) IMPLANT
SUT NOVA 0 T19/GS 22DT (SUTURE) IMPLANT
SUT NOVA NAB DX-16 0-1 5-0 T12 (SUTURE) ×2 IMPLANT
SUT VIC AB 4-0 SH 18 (SUTURE) ×2 IMPLANT
TACKER 5MM HERNIA 3.5CML NAB (ENDOMECHANICALS) ×1 IMPLANT
TAPE CLOTH 4X10 WHT NS (GAUZE/BANDAGES/DRESSINGS) IMPLANT
TOWEL OR 17X26 10 PK STRL BLUE (TOWEL DISPOSABLE) ×3 IMPLANT
TOWEL OR NON WOVEN STRL DISP B (DISPOSABLE) ×3 IMPLANT
TRAY FOLEY W/METER SILVER 14FR (SET/KITS/TRAYS/PACK) ×1 IMPLANT
TRAY FOLEY W/METER SILVER 16FR (SET/KITS/TRAYS/PACK) ×1 IMPLANT
TRAY LAPAROSCOPIC (CUSTOM PROCEDURE TRAY) ×2 IMPLANT
TROCAR BLADELESS OPT 5 100 (ENDOMECHANICALS) ×2 IMPLANT
TROCAR XCEL NON-BLD 11X100MML (ENDOMECHANICALS) ×1 IMPLANT
TUBING INSUF HEATED (TUBING) ×2 IMPLANT

## 2015-07-11 NOTE — H&P (Signed)
Austin Norris) [place]  Location: [facility] Patient #: [id number] DOB: [birth year] Married / Language: English / Race: Refused to Report/Unreported Male  History of Present Illness [name] Done MD;  Patient words: New-Umb Hernia.  The patient is a 60 year old male who presents with an umbilical hernia. Symptoms include bulge at the umbilicus. Onset was 15 year(s) ago. The patient describes this as worsening. Symptoms are exacerbated by straining and weight gain. The patient was previously evaluated by a primary physician. I explained laparoscopic and open combo repair probably with a Ventralex mesh. He understands the procedure and risks. He had a recent workup by Dr Donald Prose at [place]. His umbilicus is protruding the size of a golf ball with thinning of the umbilical skin. I gave him a copy of the hernia repair booklet. He seems to understand the procedure and risks.    Other Problems Malachi Bonds, CMA; 05/17/2015 10:37 AM) Diabetes Mellitus High blood pressure  Past Surgical History Malachi Bonds, CMA; 05/17/2015 10:37 AM) No pertinent past surgical history  Diagnostic Studies History Malachi Bonds, CMA; 05/17/2015 10:37 AM) Colonoscopy 1-5 years ago  Allergies Malachi Bonds, CMA; 05/17/2015 10:37 AM) No Known Drug Allergies 05/17/2015  Medication History (Chemira Jones, CMA; 05/17/2015 10:39 AM) AmLODIPine Besylate (5MG  Tablet, Oral) Active. Atorvastatin Calcium (20MG  Tablet, Oral) Active. Invokana (100MG  Tablet, Oral) Active. HydroCHLOROthiazide (12.5MG  Tablet, Oral) Active. NovoLOG FlexPen (100UNIT/ML Soln Pen-inj, Subcutaneous) Active. Lantus (100UNIT/ML Solution, Subcutaneous) Active. Quinapril HCl (40MG  Tablet, Oral) Active. Tamsulosin HCl (0.4MG  Capsule, Oral) Active. Medications Reconciled  Social History Malachi Bonds, CMA; 05/17/2015 10:37 AM) No drug use Tobacco use Former smoker.  Family History Malachi Bonds, CMA;  05/17/2015 10:37 AM) Diabetes Mellitus Father. Heart Disease Mother.     Review of Systems Malachi Bonds CMA; 05/17/2015 10:37 AM) General Not Present- Appetite Loss, Chills, Fatigue, Fever, Night Sweats, Weight Gain and Weight Loss. Skin Not Present- Change in Wart/Mole, Dryness, Hives, Jaundice, New Lesions, Non-Healing Wounds, Rash and Ulcer. HEENT Not Present- Earache, Hearing Loss, Hoarseness, Nose Bleed, Oral Ulcers, Ringing in the Ears, Seasonal Allergies, Sinus Pain, Sore Throat, Visual Disturbances, Wears glasses/contact lenses and Yellow Eyes.  Vitals (Chemira Jones CMA; 05/17/2015 10:37 AM) 05/17/2015 10:37 AM Weight: 189.6 lb Height: 64in Body Surface Area: 1.91 m Body Mass Index: 32.54 kg/m  Temp.: 98.56F(Oral)  Pulse: 82 (Regular)  BP: 138/88 (Sitting, Left Arm, Standard)      Physical Exam (Yazmen Briones B. Hassell Done MD; 05/17/2015 11:02 AM)  The physical exam findings are as follows:  HEENT unremarkable Neck supple Chest clear Heart SR without murmurs Abdomen-protuberant with golf ball sized mass protruding from the umbilical area centrally with attenuation of the overlying skin. Ext FROM Neuro alert and oriented x 3    Assessment & Plan [name] Done MD; 05/17/2015 10:59 AM)  Diabetes Mellitus  High blood pressure  UMBILICAL HERNIA ([id number]) Impression: Will schedule umbilical hernia repair at [facility] --laparoscopic possible open repair. He is aware of the procedure and risks.He understands and accepts risks.    Matt B. Hassell Done, MD, FACS

## 2015-07-11 NOTE — Op Note (Signed)
Surgeon: Kaylyn Lim, MD, FACS  Asst:  none  Anes:  general  Procedure: Laparoscopic assisted umbilical herniorrhaphy with small Ventralex mesh  Diagnosis: Incarcerated umbilical hernia  Complications: none  EBL:   5 cc  Drains: none  Description of Procedure:  The patient was taken to OR 4 at [facility] on Wednesday, 3.15.  After anesthesia was administered and the patient was prepped a timeout was performed.  Access to the abdomen was achieved with a 5 mm Optiview technique through the left upper quadrant.  The scope was directed around the abdomen and the omentum was incarcerated and stuck up into this umbilical hernia. There was a fairly sizable soft mass underneath the skin of the umbilicus. Reduce this gently with a prestige grasper into I reached the point of the actual adhesions were holding it into the hernia sac and I divided those with the Harmonic Scalpel. I checked the omentum and no bleeding was noted.  There was a small defect in the fascia at the umbilicus. I had 25 mm ports on the left side to do what I described in the first paragraph. At this point I made a small infraumbilical incision and then dissected the hernia sac off of the umbilical skin. I was able to get above the defect with this and excised the sac in toto. We then turned off the insufflator momentarily and at that point I went ahead and elected to repair this 1 cm defect with a the smallest ventral ex mesh patch which is about 1.7 inches in diameter. I was able to insert this through this small hole and pull it up to where it was concentrically occupying the defect. It was secured with 3 sutures of #1 Novafil through the mesh and through the fascia and tied. I then reinflated the abdomen to see that the deployment of this looked very good and was uniform. Elected to infiltrate all the port sites with 20 mL of Exparel.  The inside of the umbilicus which I noticed had some areas of skin breakdown or maceration. Before doing  the hernia repair ahead and reprepped that area with a little bit of Betadine down into the umbilicus it and I tacked the skin down to the fascia to re-create an "innie".  The skin of these defects was enclosed with 4-0 Vicryl and with liquid band. Cottonball was placed in the umbilicus in the known abdominal binder was placed prior to waking the patient. Patient was taken recovery room in satisfactory condition. The plan is for discharge later today.  The patient tolerated the procedure well and was taken to the PACU in stable condition.     Matt B. Hassell Done, [place], [facility], [place]

## 2015-07-11 NOTE — Interval H&P Note (Signed)
History and Physical Interval Note:  07/11/2015 8:26 AM  Austin Norris  has presented today for surgery, with the diagnosis of UMBILICAL HERNIA  The various methods of treatment have been discussed with the patient and family. After consideration of risks, benefits and other options for treatment, the patient has consented to  Procedure(s): LAPAROSCOPIC POSSIBLE OPEN UMBILICAL HERNIA (N/A) as a surgical intervention .  The patient's history has been reviewed, patient examined, no change in status, stable for surgery.  I have reviewed the patient's chart and labs.  Questions were answered to the patient's satisfaction.     Ayza Ripoll B

## 2015-07-11 NOTE — Discharge Instructions (Addendum)
Laparoscopic Ventral Hernia Repair Laparoscopic ventral hernia repairis a surgery to fix a ventral hernia. Aventral hernia, also called an incisional hernia, is a bulge of body tissue or intestines that pushes through the front part of the abdomen. This can happen if the connective tissue covering the muscles over the abdomen has a weak spot or is torn because of a surgical cut (incision) from a previous surgery. Laparoscopic ventral hernia repair is often done soon after diagnosis to stop the hernia from getting bigger, becoming uncomfortable, or becoming an emergency. This surgery usually takes about 2 hours, but the time can vary greatly. LET [facility] CARE PROVIDER KNOW ABOUT:  Any allergies you have.  All medicines you are taking, including steroids, vitamins, herbs, eye drops, creams, and over-the-counter medicines.  Previous problems you or members of your family have had with the use of anesthetics.  Any blood disorders you have.  Previous surgeries you have had.  Medical conditions you have. RISKS AND COMPLICATIONS  Generally, laparoscopic ventral hernia repair is a safe procedure. However, as with any surgical procedure, problems can occur. Possible problems include:  Bleeding.  Trouble passing urine or having a bowel movement after the surgery.  Infection.  Pneumonia.  Blood clots.  Pain in the area of the hernia.  A bulge in the area of the hernia that may be caused by a collection of fluid.  Injury to intestines or other structures in the abdomen.  Return of the hernia after surgery. In some cases, your health care provider may need to stop the laparoscopic procedure and do regular, open surgery. This may be necessary for very difficult hernias, when organs are hard to see, or when bleeding problems occur during surgery. BEFORE THE PROCEDURE   You may need to have blood tests, urine tests, a chest X-ray, or an electrocardiogram done before the day of the  surgery.  Ask your health care provider about changing or stopping your regular medicines. This is especially important if you are taking diabetes medicines or blood thinners.  You may need to wash with a special type of germ-killing soap.  Do not eat or drink anything after midnight the night before the procedure or as directed by your health care provider.  Make plans to have someone drive you home after the procedure. PROCEDURE   Small monitors will be put on your body. They are used to check your heart, blood pressure, and oxygen level.  An IV access tube will be put into a vein in your hand or arm. Fluids and medicine will flow directly into your body through the IV tube.  You will be given medicine that makes you go to sleep (general anesthetic).  Your abdomen will be cleaned with a special soap to kill any germs on your skin.  Once you are asleep, several small incisions will be made in your abdomen.  The large space in your abdomen will be filled with air so that it expands. This gives your health care provider more room and a better view.  A thin, lighted tube with a tiny camera on the end (laparoscope) is put through a small incision in your abdomen. The camera on the laparoscope sends a picture to a TV screen in the operating room. This gives your health care provider a good view inside your abdomen.  Hollow tubes are put through the other small incisions in your abdomen. The tools needed for the procedure are put through these tubes.  Your health care provider  puts the tissue or intestines that formed the hernia back in place.  A screen-like patch (mesh) is used to close the hernia. This helps make the area stronger. Stitches, tacks, or staples are used to keep the mesh in place.  Medicine and a bandage (dressing) or skin glue will be put over the incisions. AFTER THE PROCEDURE   You will stay in a recovery area until the anesthetic wears off. Your blood pressure and  pulse will be checked often.  You may be able to go home the same day or may need to stay in the hospital for 1-2 days after surgery. Your health care provider will decide when you can go home.  You may feel some pain. You may be given medicine for pain.  You will be urged to do breathing exercises that involve taking deep breaths. This helps prevent a lung infection after a surgery.  You may have to wear compression stockings while you are in the hospital. These stockings help keep blood clots from forming in your legs.   This information is not intended to replace advice given to you by your health care provider. Make sure you discuss any questions you have with your health care provider.   Document Released: 03/31/2012 Document Revised: 04/19/2013 Document Reviewed: 03/31/2012 Elsevier Interactive Patient Education 2016 [employer]  Laparoscopic Ventral Hernia Repair, Care After Refer to this sheet in the next few weeks. These instructions provide you with information on caring for yourself after your procedure. Your caregiver may also give you more specific instructions. Your treatment has been planned according to current medical practices, but problems sometimes occur. Call your caregiver if you have any problems or questions after your procedure.  HOME CARE INSTRUCTIONS  Only take over-the-counter or prescription medicines as directed by your caregiver. If antibiotic medicines are prescribed, take them as directed. Finish them even if you start to feel better. Always wash your hands before touching your abdomen. Take your bandages (dressings) off after 48 hours or as directed by your caregiver. You may have skin adhesive strips or skin glue over the surgical cuts (incisions). Do not take the strips off or peel the glue off. These will fall off on their own. Take showers once your caregiver approves. Until then, only take sponge baths. Do not take tub baths or go swimming until your  caregiver approves. Check your incision area every day for swelling, redness, warmth, and blood or fluid leaking from the incision. These are signs of infection. Wash your hands before you check. Hold a pillow over your abdomen when you cough or sneeze to help ease pain. Eat foods that are high in fiber, such as whole grains, fruits, and vegetables. Fiber helps prevent difficult bowel movements (constipation). Drink enough fluids to keep your urine clear or pale yellow. Rest and lessen activity for 4-5 days after the surgery. You may take short walks if your caregiver approves. Do not drive until approved by your caregiver. Do not lift anything heavy, participate in sports, or have sexual intercourse for 6-8 weeks or until your caregiver approves.  Ask your caregiver when you can return to work. Keep all follow-up appointments. SEEK MEDICAL CARE IF:  You have pain even after taking pain medicine. You have not had a bowel movement in 3 days. You have cramps or are nauseous. SEEK IMMEDIATE MEDICAL CARE IF:  You have pain or swelling that is getting worse. You have redness around your incisions. Your incision is pulling apart. You have  blood or fluid leaking from your incisions. You are vomiting. You cannot pass urine. MAKE SURE YOU:  Understand these instructions. Will watch your condition. Will get help right away if you are not doing well or get worse.   This information is not intended to replace advice given to you by your health care provider. Make sure you discuss any questions you have with your health care provider.   Document Released: 03/31/2012 Document Revised: 05/05/2014 Document Reviewed: 03/31/2012 Elsevier Interactive Patient Education [employer].

## 2015-07-11 NOTE — Anesthesia Procedure Notes (Signed)
Procedure Name: Intubation Date/Time: 07/11/2015 8:40 AM Performed by: West Pugh Pre-anesthesia Checklist: Patient identified, Emergency Drugs available, Suction available, Patient being monitored and Timeout performed Patient Re-evaluated:Patient Re-evaluated prior to inductionOxygen Delivery Method: Circle system utilized Preoxygenation: Pre-oxygenation with 100% oxygen Intubation Type: IV induction Ventilation: Two handed mask ventilation required Laryngoscope Size: Mac and 3 Tube type: Oral Tube size: 7.5 mm Number of attempts: 2 Airway Equipment and Method: Stylet and Video-laryngoscopy Placement Confirmation: ETT inserted through vocal cords under direct vision,  positive ETCO2,  CO2 detector and breath sounds checked- equal and bilateral Secured at: 23 cm Tube secured with: Tape Dental Injury: Teeth and Oropharynx as per pre-operative assessment  Difficulty Due To: Difficult Airway- due to anterior larynx Comments: DL x1 attempt with MAC 4-Grade 4-no success. Glidescope #4 utilized x1 attempt with success. Two handed mask ventilation utilized.

## 2015-07-11 NOTE — Anesthesia Postprocedure Evaluation (Signed)
Anesthesia Post Note  Patient: Austin Norris  Procedure(s) Performed: Procedure(s) (LRB): LAPAROSCOPIC ASSISTED OPEN UMBILICAL HERNIA (N/A)  Patient location during evaluation: PACU Anesthesia Type: General Level of consciousness: sedated and patient cooperative Pain management: pain level controlled Vital Signs Assessment: post-procedure vital signs reviewed and stable Respiratory status: spontaneous breathing Cardiovascular status: stable Anesthetic complications: no    Last Vitals:  Filed Vitals:   07/11/15 1100 07/11/15 1117  BP: 161/86 148/80  Pulse: 84 81  Temp: 36.5 C 36.4 C  Resp: 17 16    Last Pain:  Filed Vitals:   07/11/15 1127  PainSc: [place]

## 2015-07-11 NOTE — Transfer of Care (Signed)
Immediate Anesthesia Transfer of Care Note  Patient: Austin Norris  Procedure(s) Performed: Procedure(s): LAPAROSCOPIC ASSISTED OPEN UMBILICAL HERNIA (N/A)  Patient Location: PACU  Anesthesia Type:General  Level of Consciousness:  sedated, patient cooperative and responds to stimulation  Airway & Oxygen Therapy:Patient Spontanous Breathing and Patient connected to face mask oxgen  Post-op Assessment:  Report given to PACU RN and Post -op Vital signs reviewed and stable  Post vital signs:  Reviewed and stable  Last Vitals:  Filed Vitals:   07/11/15 0640 07/11/15 1009  BP: 151/76 166/84  Pulse: 71 88  Temp: 36.3 C 36.4 C  Resp: 16 8    Complications: No apparent anesthesia complications

## 2015-07-26 ENCOUNTER — Other Ambulatory Visit: Payer: Self-pay | Admitting: Endocrinology

## 2015-08-03 ENCOUNTER — Other Ambulatory Visit: Payer: Self-pay | Admitting: Endocrinology

## 2015-08-11 ENCOUNTER — Other Ambulatory Visit: Payer: Self-pay | Admitting: Endocrinology

## 2015-08-13 ENCOUNTER — Other Ambulatory Visit: Payer: Self-pay | Admitting: Endocrinology

## 2015-09-03 LAB — HM DIABETES EYE EXAM

## 2015-09-04 ENCOUNTER — Encounter: Payer: Self-pay | Admitting: *Deleted

## 2015-09-06 ENCOUNTER — Other Ambulatory Visit: Payer: Self-pay | Admitting: Endocrinology

## 2015-09-07 ENCOUNTER — Other Ambulatory Visit: Payer: Self-pay | Admitting: Endocrinology

## 2015-09-17 ENCOUNTER — Telehealth: Payer: Self-pay | Admitting: Endocrinology

## 2015-09-17 NOTE — Telephone Encounter (Signed)
PT called just requesting to talk to Dr. Dwyane Dee, he would not give me information.  Requested call back

## 2015-09-17 NOTE — Telephone Encounter (Signed)
Noted, patient is aware. 

## 2015-09-17 NOTE — Telephone Encounter (Signed)
Patient needs advise on how to take his insulin, he said Friday is a holy day and he will be fasting, he wants to make sure that is okay and what he needs to do?  Please advise.

## 2015-09-17 NOTE — Telephone Encounter (Signed)
If he is fasting he will take only 40 instead of 50 Lantus the night before He will not take Invokana on the morning of the fast and take Novolog only when he eats a meal

## 2015-10-04 ENCOUNTER — Other Ambulatory Visit: Payer: Self-pay | Admitting: Endocrinology

## 2015-10-22 ENCOUNTER — Other Ambulatory Visit: Payer: Self-pay | Admitting: Endocrinology

## 2015-10-29 ENCOUNTER — Telehealth: Payer: Self-pay | Admitting: Endocrinology

## 2015-10-29 NOTE — Telephone Encounter (Signed)
[place] is going [place] can we somehow give me an extra month supply of meds for while he is over there

## 2015-11-02 ENCOUNTER — Telehealth: Payer: Self-pay | Admitting: Endocrinology

## 2015-11-02 MED ORDER — "INSULIN SYRINGE 31G X 5/16"" 1 ML MISC": Status: DC

## 2015-11-02 MED ORDER — GLUCOSE BLOOD VI STRP: ORAL_STRIP | Status: DC

## 2015-11-02 MED ORDER — METFORMIN HCL ER 500 MG PO TB24: ORAL_TABLET | ORAL | Status: DC

## 2015-11-02 MED ORDER — ATORVASTATIN CALCIUM 20 MG PO TABS: 20.0000 mg | ORAL_TABLET | Freq: Every day | ORAL | Status: DC

## 2015-11-02 MED ORDER — QUINAPRIL HCL 40 MG PO TABS: 40.0000 mg | ORAL_TABLET | Freq: Every day | ORAL | Status: DC

## 2015-11-02 MED ORDER — AMLODIPINE BESYLATE 5 MG PO TABS
5.0000 mg | ORAL_TABLET | Freq: Every day | ORAL | Status: DC
Start: 2015-11-02 — End: 2016-09-04

## 2015-11-02 MED ORDER — CANAGLIFLOZIN 300 MG PO TABS: ORAL_TABLET | ORAL | Status: DC

## 2015-11-02 MED ORDER — INSULIN ASPART 100 UNIT/ML FLEXPEN: 20.0000 [IU] | PEN_INJECTOR | Freq: Three times a day (TID) | SUBCUTANEOUS | Status: DC

## 2015-11-02 MED ORDER — HYDROCHLOROTHIAZIDE 12.5 MG PO CAPS: 12.5000 mg | ORAL_CAPSULE | Freq: Every day | ORAL | Status: DC

## 2015-11-02 NOTE — Telephone Encounter (Signed)
Rx for Amlodipine, Lipitor, test strips, Hydrochlorothiazide, Novolog, syringes, invokana, and quinapril. Rx for Hydrocodone and flomax are not refilled by Dr. Dwyane Dee and 81 mg asp is over the counter medication.

## 2015-11-02 NOTE — Telephone Encounter (Signed)
Patient need a refill of all medications for two months, he is going out of the country tomorrow. See if Dr Loanne Drilling help  Hospital Medications (0) Clinic-Administered Medications (0)   amLODipine (NORVASC) 5 MG tablet   aspirin 81 MG tablet   atorvastatin (LIPITOR) 20 MG tablet   atorvastatin (LIPITOR) 20 MG tablet   FREESTYLE LITE test strip   hydrochlorothiazide (MICROZIDE) 12.5 MG capsule   HYDROcodone-acetaminophen (NORCO) 5-325 MG tablet   insulin aspart (NOVOLOG FLEXPEN) 100 UNIT/ML FlexPen   Insulin Syringe-Needle U-100 (INSULIN SYRINGE 1CC/31GX5/16") 31G X 5/16" 1 ML MISC   INVOKANA 300 MG TABS tablet   LANTUS 100 UNIT/ML injection   metFORMIN (GLUCOPHAGE-XR) 500 MG 24 hr tablet   quinapril (ACCUPRIL) 40 MG tablet   tamsulosin (FLOMAX) 0.4 MG CAPS capsule       Send to  [id number] - [place], [place] [id number] (Phone) [phone] (Fax)

## 2015-12-04 ENCOUNTER — Other Ambulatory Visit: Payer: Self-pay | Admitting: Endocrinology

## 2015-12-07 ENCOUNTER — Other Ambulatory Visit: Payer: Self-pay | Admitting: Endocrinology

## 2016-01-08 ENCOUNTER — Other Ambulatory Visit: Payer: Self-pay | Admitting: Endocrinology

## 2016-01-10 ENCOUNTER — Other Ambulatory Visit: Payer: Self-pay | Admitting: Endocrinology

## 2016-01-11 ENCOUNTER — Telehealth: Payer: Self-pay | Admitting: Endocrinology

## 2016-01-11 NOTE — Telephone Encounter (Signed)
Free style meter to contour meter.and his b/p medication but he do not know the name of it.  [id number] - [place], [place] [address] [phone] (Phone) [phone] (Fax)

## 2016-01-14 ENCOUNTER — Other Ambulatory Visit: Payer: Self-pay | Admitting: Endocrinology

## 2016-01-14 ENCOUNTER — Encounter: Payer: Self-pay | Admitting: Endocrinology

## 2016-01-14 ENCOUNTER — Ambulatory Visit (INDEPENDENT_AMBULATORY_CARE_PROVIDER_SITE_OTHER): Payer: BLUE CROSS/BLUE SHIELD | Admitting: Endocrinology

## 2016-01-14 ENCOUNTER — Other Ambulatory Visit: Payer: Self-pay | Admitting: *Deleted

## 2016-01-14 VITALS — BP 138/74 | HR 69 | Temp 98.0°F | Resp 14 | Ht 65.0 in | Wt 184.0 lb

## 2016-01-14 DIAGNOSIS — Z23 Encounter for immunization: Secondary | ICD-10-CM

## 2016-01-14 DIAGNOSIS — E1165 Type 2 diabetes mellitus with hyperglycemia: Secondary | ICD-10-CM | POA: Diagnosis not present

## 2016-01-14 LAB — POCT GLYCOSYLATED HEMOGLOBIN (HGB A1C): Hemoglobin A1C: 7.1

## 2016-01-14 MED ORDER — MICROLET LANCETS MISC: 3 refills | Status: DC

## 2016-01-14 MED ORDER — GLUCOSE BLOOD VI STRP: ORAL_STRIP | 3 refills | Status: DC

## 2016-01-14 MED ORDER — AMLODIPINE BESYLATE 5 MG PO TABS: 5.0000 mg | ORAL_TABLET | Freq: Every day | ORAL | 0 refills | Status: DC

## 2016-01-14 NOTE — Progress Notes (Signed)
Patient ID: Austin Norris, male   DOB: Mar 21, 1956, 60 y.o.   MRN: [id number]   Reason for Appointment: Diabetes follow-up   History of Present Illness   Diagnosis: Type 2 DIABETES MELITUS, long-standing     He has been on insulin to treat his diabetes for several years partly because of failure of oral agents and also for reducing cost of medications He is generally checking his blood sugars once or twice a day but does not keep a record Overall has had somewhat poor control with A1c consistently over 7%  Recent history:    Insulin regimen: Lantus  30 units at bedtime, Novolog 20-30 at supper  He is again noncompliant with his follow-up  A1c is better at 7.1 compared to 7.6 previously  Current blood sugar patterns, management and problems identified:  Not clear why he has reduced his insulin dose and is taking 20 units less of the Lantus However he was told to start back on his metformin on his last visit and metformin is tolerated well. He is now also taking the higher dose of 300 mg Invokana without any side effects  He thinks he had much lower blood sugars and lost weight when he was in [place] and was walking more  Fasting readings: Have been relatively better recently although he thinks he has low sugars during the night Has only one low blood sugar of 63 documented about 2:30 AM Otherwise most of his readings are fairly good in the morning  MEALTIME insulin: He is only taking Novolog at suppertime and not at breakfast or lunch again He still takes larger doses compared to his basal insulin He has numerous blood sugars in the evenings, sometimes several times in the same day and not clear which readings are after meals Blood sugars before and after supper are somewhat variable but on an average about 150  Postprandial readings: He is checking his blood sugar only rarely at lunchtime and these may be relatively higher  Blood sugars AFTER evening  meal as above I generally well controlled with sporadic very high readings around 6-7 PM  Hypoglycemia: This will happen only occasionally as above  Oral hypoglycemic drugs: Invokana 100 mg     Side effects from medications: None  Proper timing of medications in relation to meals: Yes.          Monitors blood glucose:  at least  Once a day.    Glucometer:   FreeStyle  Blood Glucose readings from download as above:  AVERAGE 143, range 63-303         Meals: 1-2 meals per day, usually bread and milk in the morning, relatively smaller lunch, sometimes having eggs at lunchtime., sometimes sweets in pm, fried food occasionally        Dinner usually 5-6 PM   Physical activity: exercise: Mostly walking at work, is active in the parking lot of his car [place] visit: Most recent: None             Wt Readings from Last 3 Encounters:  01/14/16 184 lb (83.5 kg)  07/11/15 189 lb (85.7 kg)  07/06/15 189 lb (85.7 kg)   LABS:  Lab Results  Component Value Date   HGBA1C 7.1 01/14/2016   HGBA1C 7.6 (H) 07/06/2015   HGBA1C 7.6 06/11/2015   Lab Results  Component Value Date  MICROALBUR <0.7 01/31/2015   LDLCALC 86 01/31/2015   CREATININE 0.78 07/06/2015        Medication List       Accurate as of 01/14/16 10:39 AM. Always use your most recent med list.          amLODipine 5 MG tablet Commonly known as:  NORVASC Take 1 tablet (5 mg total) by mouth daily.   amLODipine 5 MG tablet Commonly known as:  NORVASC TAKE 1 TABLET BY MOUTH EVERY DAY   aspirin 81 MG tablet Take 81 mg by mouth daily.   atorvastatin 20 MG tablet Commonly known as:  LIPITOR TAKE 1 TABLET BY MOUTH EVERY DAY   canagliflozin 300 MG Tabs tablet Commonly known as:  INVOKANA TAKE 1 TABLET (300 MG TOTAL) BY MOUTH DAILY BEFORE BREAKFAST.   INVOKANA 300 MG Tabs tablet Generic drug:  canagliflozin TAKE 1 TABLET (300 MG TOTAL) BY MOUTH DAILY BEFORE BREAKFAST.   glucose blood test  strip Commonly known as:  FREESTYLE LITE USE TO CHECK BLOOD SUGAR 2 TIMES A DAY   hydrochlorothiazide 12.5 MG capsule Commonly known as:  MICROZIDE Take 1 capsule (12.5 mg total) by mouth daily.   HYDROcodone-acetaminophen 5-325 MG tablet Commonly known as:  NORCO Take 1 tablet by mouth every 4 (four) hours as needed for moderate pain.   insulin aspart 100 UNIT/ML FlexPen Commonly known as:  NOVOLOG FLEXPEN Inject 20 Units into the skin 3 (three) times daily with meals.   INSULIN SYRINGE 1CC/31GX5/16" 31G X 5/16" 1 ML Misc Use to inject lantus daily   LANTUS 100 UNIT/ML injection Generic drug:  insulin glargine INJECT 0.5 MLS (50 UNITS TOTAL) INTO THE SKIN AT BEDTIME.   metFORMIN 500 MG 24 hr tablet Commonly known as:  GLUCOPHAGE-XR TAKE 2 TABLETS DAILY WITH DINNER.   quinapril 40 MG tablet Commonly known as:  ACCUPRIL Take 1 tablet (40 mg total) by mouth at bedtime.   tamsulosin 0.4 MG Caps capsule Commonly known as:  FLOMAX Take 0.4 mg by mouth at bedtime.       Allergies: No Known Allergies  Past Medical History:  Diagnosis Date  . Diabetes mellitus   . H/O epistaxis   . History of stomach ulcers    30 YRS AGO  . Hypercholesteremia   . Hypertension   . Shoulder pain, left   . Umbilical hernia     Past Surgical History:  Procedure Laterality Date  . CARDIAC CATHETERIZATION  1997  . COLONOSCOPY    . UMBILICAL HERNIA REPAIR N/A 07/11/2015   Procedure: LAPAROSCOPIC ASSISTED OPEN UMBILICAL HERNIA;  Surgeon: Austin Hausen, MD;  Location: [facility];  Service: General;  Laterality: N/A;  With MESH    Family History  Problem Relation Age of Onset  . Heart disease Mother   . Diabetes Father   . Diabetes Sister    No colon cancer  Social History:  reports that he quit smoking about 18 years ago. He has never used smokeless tobacco. He reports that he does not drink alcohol or use drugs.  Review of Systems:  Last eye exam 5/17, normal  HYPERTENSION:  he  has had long-standing hypertension Is  taking HCTZ in addition to his 5 mg amlodipine  along with his Accupril 40 mg HCTZ was started in 4/16 when blood pressure was significantly high and associated with  epistaxis He has checked blood pressure at home with a new monitor now and he thinks the readings are consistently below [id number] systolic  HYPERLIPIDEMIA: The lipid abnormality consists of elevated LDL.  Previously had been irregular with taking his Lipitor because of fear of side effects.  Now he  is taking his Lipitor regularly at the 20 mg dose  Has had some evidence of CAD in the past but currently asymptomatic  Lab Results  Component Value Date   CHOL 158 01/31/2015   HDL 54.30 01/31/2015   LDLCALC 86 01/31/2015   LDLDIRECT 89.8 12/29/2013   TRIG 89.0 01/31/2015   CHOLHDL 3 01/31/2015          Examination:   BP 138/74   Pulse 69   Temp 98 F (36.7 C)   Resp 14   Ht 5\' 5"  (1.651 m)   Wt 184 lb (83.5 kg)   SpO2 96%   BMI 30.62 kg/m   Body mass index is 30.62 kg/m.    ASSESSMENT/ PLAN:   Diabetes type 2   See history of present illness for detailed discussion of his current management, blood sugar patterns and problems identified  Blood sugars are overall well controlled and he is taking less basal insulin with adding metformin and also 300 mg Invokana He thinks he gets overnight hypoglycemia although not documented often Blood sugars may be relatively higher in the afternoons especially with no mealtime insulin  He has had significant fluctuation of his blood glucose levels after meals and some overnight also Some of his overnight fluctuation may be related to variable fluid intake, and snacks as well as inconsistent effect of Lantus insulin  Currently taking insulin only with mealtime coverage at suppertime and basal insulin about 7 PM Also may be having higher readings with living off METFORMIN He clearly has significant hyperglycemia after lunch especially on  weekends when he is not as active and has not taken any mealtime doses for this  RECOMMENDATIONS:  Take Lantus at breakfast and try 30 units for now  Will need to check coverage of Tresiba or Toujeo  Increase Invokana to 300 mg  Check more readings after breakfast  HYPERTENSION: His blood pressure is under fair control  No change in medications as yet  Flu vaccine given  Counseling time on day-to-day diabetes management, insulin timing and adjustment, options for basal insulins, postprandial blood sugar targets, need for regular exercise, protein with every meal, eye exams, retinopathy, regular foot exams, blood pressure targets, above is over 50% of today's 25 minute visit   Patient Instructions  Take 30 Lantus in am  Adjust the dose of NovoLog at dinnertime based on how much you eating and amount of starch. Make sure you have some protein with breakfast everyday like an egg or yogurt  Also check the coverage for TOUJEO and tresiba insulin with you insurance or the drugstore  Walk daily    [facility] 01/14/2016, 10:39 AM   Note: This office note was prepared with Dragon voice recognition system technology. Any transcriptional errors that result from this process are unintentional.

## 2016-01-14 NOTE — Patient Instructions (Addendum)
Take 30 Lantus in am  Adjust the dose of NovoLog at dinnertime based on how much you eating and amount of starch. Make sure you have some protein with breakfast everyday like an egg or yogurt  Also check the coverage for TOUJEO and tresiba insulin with you insurance or the drugstore  Walk daily

## 2016-01-15 ENCOUNTER — Other Ambulatory Visit: Payer: BLUE CROSS/BLUE SHIELD

## 2016-01-15 ENCOUNTER — Other Ambulatory Visit (INDEPENDENT_AMBULATORY_CARE_PROVIDER_SITE_OTHER): Payer: BLUE CROSS/BLUE SHIELD

## 2016-01-15 DIAGNOSIS — E1165 Type 2 diabetes mellitus with hyperglycemia: Secondary | ICD-10-CM | POA: Diagnosis not present

## 2016-01-15 DIAGNOSIS — Z794 Long term (current) use of insulin: Secondary | ICD-10-CM

## 2016-01-15 NOTE — Telephone Encounter (Signed)
Pt requesting for all of his meds to be called in for 90 day supplies to [facility] please

## 2016-01-16 ENCOUNTER — Other Ambulatory Visit: Payer: Self-pay | Admitting: *Deleted

## 2016-01-16 LAB — URINALYSIS, ROUTINE W REFLEX MICROSCOPIC
BILIRUBIN URINE: NEGATIVE
Hgb urine dipstick: NEGATIVE
KETONES UR: NEGATIVE
LEUKOCYTES UA: NEGATIVE
Nitrite: NEGATIVE
PH: 6 (ref 5.0–8.0)
RBC / HPF: NONE SEEN (ref 0–?)
SPECIFIC GRAVITY, URINE: 1.02 (ref 1.000–1.030)
Total Protein, Urine: NEGATIVE
Urine Glucose: >=1000 — AB
Urobilinogen, UA: 0.2 (ref 0.0–1.0)
WBC, UA: NONE SEEN (ref 0–?)

## 2016-01-16 LAB — COMPREHENSIVE METABOLIC PANEL
ALBUMIN: 4.2 g/dL (ref 3.5–5.2)
ALK PHOS: 80 U/L (ref 39–117)
ALT: 13 U/L (ref 0–53)
AST: 17 U/L (ref 0–37)
BILIRUBIN TOTAL: 0.3 mg/dL (ref 0.2–1.2)
BUN: 14 mg/dL (ref 6–23)
CO2: 28 mEq/L (ref 19–32)
Calcium: 9.1 mg/dL (ref 8.4–10.5)
Chloride: 102 mEq/L (ref 96–112)
Creatinine, Ser: 0.9 mg/dL (ref 0.40–1.50)
GFR: 110.36 mL/min (ref >60.00)
GLUCOSE: 88 mg/dL (ref 70–99)
Potassium: 3.6 mEq/L (ref 3.5–5.1)
Sodium: 139 mEq/L (ref 135–145)
TOTAL PROTEIN: 7.2 g/dL (ref 6.0–8.3)

## 2016-01-16 LAB — LIPID PANEL
CHOLESTEROL: 142 mg/dL (ref 0–200)
HDL: 46.1 mg/dL (ref >39.00)
LDL Cholesterol: 72 mg/dL (ref 0–99)
NONHDL: 95.62
Total CHOL/HDL Ratio: 3
Triglycerides: 120 mg/dL (ref 0.0–149.0)
VLDL: 24 mg/dL (ref 0.0–40.0)

## 2016-01-16 LAB — MICROALBUMIN / CREATININE URINE RATIO
CREATININE, U: 114.8 mg/dL
MICROALB/CREAT RATIO: 0.9 mg/g (ref 0.0–30.0)
Microalb, Ur: 1 mg/dL (ref 0.0–1.9)

## 2016-01-16 MED ORDER — "INSULIN SYRINGE 31G X 5/16"" 1 ML MISC": 1 refills | Status: AC

## 2016-01-16 MED ORDER — INSULIN ASPART 100 UNIT/ML FLEXPEN: 20.0000 [IU] | PEN_INJECTOR | Freq: Three times a day (TID) | SUBCUTANEOUS | 0 refills | Status: DC

## 2016-01-16 MED ORDER — CANAGLIFLOZIN 300 MG PO TABS: ORAL_TABLET | ORAL | 0 refills | Status: DC

## 2016-01-16 MED ORDER — GLUCOSE BLOOD VI STRP: ORAL_STRIP | 1 refills | Status: DC

## 2016-01-16 MED ORDER — ATORVASTATIN CALCIUM 20 MG PO TABS: 20.0000 mg | ORAL_TABLET | Freq: Every day | ORAL | 0 refills | Status: DC

## 2016-01-16 MED ORDER — MICROLET LANCETS MISC: 1 refills | Status: AC

## 2016-01-16 MED ORDER — METFORMIN HCL ER 500 MG PO TB24: ORAL_TABLET | ORAL | 0 refills | Status: DC

## 2016-01-16 MED ORDER — QUINAPRIL HCL 40 MG PO TABS: 40.0000 mg | ORAL_TABLET | Freq: Every day | ORAL | 0 refills | Status: DC

## 2016-01-16 NOTE — Telephone Encounter (Signed)
rx's sent for a 90 day supply

## 2016-01-17 NOTE — Progress Notes (Signed)
Please let patient know that the cholesterol and chemistry results normal and no further action needed

## 2016-04-04 ENCOUNTER — Other Ambulatory Visit: Payer: Self-pay | Admitting: Endocrinology

## 2016-04-24 ENCOUNTER — Other Ambulatory Visit: Payer: Self-pay | Admitting: Endocrinology

## 2016-05-08 ENCOUNTER — Telehealth: Payer: Self-pay | Admitting: Endocrinology

## 2016-05-08 NOTE — Telephone Encounter (Signed)
Pt called in and said that he has the flu and wanted to come in to get an antibiotic shot? Please advise.

## 2016-05-11 ENCOUNTER — Other Ambulatory Visit: Payer: Self-pay | Admitting: Endocrinology

## 2016-05-11 DIAGNOSIS — IMO0002 Reserved for concepts with insufficient information to code with codable children: Secondary | ICD-10-CM

## 2016-05-11 DIAGNOSIS — Z794 Long term (current) use of insulin: Principal | ICD-10-CM

## 2016-05-11 DIAGNOSIS — E1165 Type 2 diabetes mellitus with hyperglycemia: Secondary | ICD-10-CM

## 2016-05-11 DIAGNOSIS — E118 Type 2 diabetes mellitus with unspecified complications: Principal | ICD-10-CM

## 2016-05-12 ENCOUNTER — Other Ambulatory Visit (INDEPENDENT_AMBULATORY_CARE_PROVIDER_SITE_OTHER): Payer: BLUE CROSS/BLUE SHIELD

## 2016-05-12 DIAGNOSIS — E118 Type 2 diabetes mellitus with unspecified complications: Secondary | ICD-10-CM | POA: Diagnosis not present

## 2016-05-12 DIAGNOSIS — E1165 Type 2 diabetes mellitus with hyperglycemia: Secondary | ICD-10-CM | POA: Diagnosis not present

## 2016-05-12 DIAGNOSIS — IMO0002 Reserved for concepts with insufficient information to code with codable children: Secondary | ICD-10-CM

## 2016-05-12 DIAGNOSIS — Z794 Long term (current) use of insulin: Secondary | ICD-10-CM

## 2016-05-12 LAB — BASIC METABOLIC PANEL
BUN: 17 mg/dL (ref 6–23)
CALCIUM: 9.5 mg/dL (ref 8.4–10.5)
CO2: 27 mEq/L (ref 19–32)
CREATININE: 0.92 mg/dL (ref 0.40–1.50)
Chloride: 102 mEq/L (ref 96–112)
GFR: 107.48 mL/min (ref >60.00)
GLUCOSE: 182 mg/dL — AB (ref 70–99)
Potassium: 4.2 mEq/L (ref 3.5–5.1)
Sodium: 138 mEq/L (ref 135–145)

## 2016-05-12 LAB — HEMOGLOBIN A1C: Hgb A1c MFr Bld: 7 % — ABNORMAL HIGH (ref 4.6–6.5)

## 2016-05-12 NOTE — Telephone Encounter (Signed)
Patient is retuning your call 

## 2016-05-12 NOTE — Telephone Encounter (Signed)
Attempted to call the pt for further info on symptoms, had to leave a message.

## 2016-05-13 NOTE — Telephone Encounter (Signed)
Patient is retuning your call 

## 2016-05-13 NOTE — Telephone Encounter (Signed)
Left a voice mail for patient to call back and let us know if he is still experiencing flu-like symptoms

## 2016-05-19 ENCOUNTER — Encounter: Payer: Self-pay | Admitting: Endocrinology

## 2016-05-19 ENCOUNTER — Ambulatory Visit (INDEPENDENT_AMBULATORY_CARE_PROVIDER_SITE_OTHER): Payer: BLUE CROSS/BLUE SHIELD | Admitting: Endocrinology

## 2016-05-19 VITALS — BP 132/82 | HR 89 | Wt 188.0 lb

## 2016-05-19 DIAGNOSIS — R200 Anesthesia of skin: Secondary | ICD-10-CM

## 2016-05-19 DIAGNOSIS — E7800 Pure hypercholesterolemia, unspecified: Secondary | ICD-10-CM

## 2016-05-19 DIAGNOSIS — E1165 Type 2 diabetes mellitus with hyperglycemia: Secondary | ICD-10-CM | POA: Diagnosis not present

## 2016-05-19 DIAGNOSIS — Z794 Long term (current) use of insulin: Secondary | ICD-10-CM

## 2016-05-19 DIAGNOSIS — I10 Essential (primary) hypertension: Secondary | ICD-10-CM | POA: Diagnosis not present

## 2016-05-19 NOTE — Patient Instructions (Addendum)
When not walking and on Sunday take 20-25 Novolog at lunch; ? 5 at [place] blood sugars on waking up  3x weekly  Also check blood sugars about 2 hours after a meal and do this after different meals by rotation  Recommended blood sugar levels on waking up is 90-130 and about 2 hours after meal is 130-160  Please bring your blood sugar monitor to each visit, thank you   LANTUS 32 UNITS

## 2016-05-19 NOTE — Progress Notes (Signed)
Patient ID: Austin Norris, male   DOB: Sep 12, 1955, 61 y.o.   MRN: [id number]   Reason for Appointment: Diabetes follow-up   History of Present Illness   Diagnosis: Type 2 DIABETES MELITUS, long-standing     He has been on insulin to treat his diabetes for several years partly because of failure of oral agents and also for reducing cost of medications He is generally checking his blood sugars once or twice a day but does not keep a record Overall has had somewhat poor control with A1c consistently over 7%  Recent history:    Insulin regimen: Lantus  35 units at bedtime, Novolog 30 at supper  A1c is better at 7.0 compared to 7.1 previously  Current blood sugar patterns, management and problems identified:   He was told to increase frequency of checking after meals but is doing only sporadic readings at bedtime; these appear to be generally fairly good with only one significantly high readings when he forgot his NovoLog at supper  He has adjusted his insulin doses on his own empirically; he has gone up on his Lantus 5 units because he thinks he needs more for his evening meal.  He was told to try Lantus in the morning but he thinks this causes low sugars when he is active  He does not take any Novolog for mealtime coverage on Sundays are his days off in blood sugars are higher in the afternoon and evening on these days  Again not checking blood sugars after meals usually during the weekday and his glucose was 182 after breakfast in the lab; he was not active that morning  Fasting readings: Have been fairly good usually with only one low normal reading of 76 However he has had a low blood sugar of 65 around 5 AM once  MEALTIME insulin: He is only taking Novolog at suppertime and not at breakfast or lunch again He still takes larger doses compared to his basal insulin He has numerous blood sugars in the evenings, sometimes several times in the same day and not clear  which readings are after meals Blood sugars before and after supper are somewhat variable but on an average about 150  Postprandial readings: He is checking his blood sugar only rarely at lunchtime and these may be relatively higher  Blood sugars AFTER evening meal as above I generally well controlled with sporadic very high readings around 6-7 PM  Hypoglycemia: This will happen only occasionally as above  Oral hypoglycemic drugs: Invokana 100 mg     Side effects from medications: None  Proper timing of medications in relation to meals: Yes.          Monitors blood glucose:  at least  Once a day.    Glucometer:  Blood Glucose readings from download   Mean values apply above for all meters except median for One Touch  PRE-MEAL Fasting Lunch Dinner Bedtime Overall  Glucose range: 76-182  144, 176  112-193  96-325    Mean/median: 124        POST-MEAL PC Breakfast PC Lunch PC Dinner  Glucose range:     Mean/median:        AVERAGE 143, range 63-303         Meals: 1-2 meals per day, usually bread and milk in the morning, relatively smaller lunch, sometimes having eggs at lunchtime., sometimes sweets in pm, fried food occasionally  Dinner usually 5-6 PM   Physical activity: exercise: Mostly walking at work, is active in the parking lot of his car [place] visit: Most recent: None             Wt Readings from Last 3 Encounters:  05/19/16 188 lb (85.3 kg)  01/14/16 184 lb (83.5 kg)  07/11/15 189 lb (85.7 kg)   LABS:  Lab Results  Component Value Date   HGBA1C 7.0 (H) 05/12/2016   HGBA1C 7.1 01/14/2016   HGBA1C 7.6 (H) 07/06/2015   Lab Results  Component Value Date   MICROALBUR 1.0 01/15/2016   LDLCALC 72 01/16/2016   CREATININE 0.92 05/12/2016      Allergies as of 05/19/2016   No Known Allergies     Medication List       Accurate as of 05/19/16  9:26 AM. Always use your most recent med list.          amLODipine 5 MG  tablet Commonly known as:  NORVASC Take 1 tablet (5 mg total) by mouth daily.   aspirin 81 MG tablet Take 81 mg by mouth daily.   atorvastatin 20 MG tablet Commonly known as:  LIPITOR TAKE 1 TABLET EVERY DAY   canagliflozin 300 MG Tabs tablet Commonly known as:  INVOKANA TAKE 1 TABLET (300 MG TOTAL) BY MOUTH DAILY BEFORE BREAKFAST.   glucose blood test strip Commonly known as:  BAYER CONTOUR NEXT TEST Use as instructed to check blood sugar 2 times per day   hydrochlorothiazide 12.5 MG capsule Commonly known as:  MICROZIDE TAKE ONE CAPSULE BY MOUTH DAILY   HYDROcodone-acetaminophen 5-325 MG tablet Commonly known as:  NORCO Take 1 tablet by mouth every 4 (four) hours as needed for moderate pain.   INSULIN SYRINGE 1CC/31GX5/16" 31G X 5/16" 1 ML Misc Use to inject lantus daily   LANTUS 100 UNIT/ML injection Generic drug:  insulin glargine INJECT 0.5 MLS (50 UNITS TOTAL) INTO THE SKIN AT BEDTIME.   metFORMIN 500 MG 24 hr tablet Commonly known as:  GLUCOPHAGE-XR TAKE 2 TABLETS DAILY WITH DINNER.   MICROLET LANCETS Misc Use to check blood sugar 2 times a day   NOVOLOG FLEXPEN 100 UNIT/ML FlexPen Generic drug:  insulin aspart INJECT 20 UNITS INTO THE SKIN 3 (THREE) TIMES DAILY WITH MEALS.   quinapril 40 MG tablet Commonly known as:  ACCUPRIL Take 1 tablet (40 mg total) by mouth at bedtime.   tamsulosin 0.4 MG Caps capsule Commonly known as:  FLOMAX Take 0.4 mg by mouth at bedtime.       Allergies: No Known Allergies  Past Medical History:  Diagnosis Date  . Diabetes mellitus   . H/O epistaxis   . History of stomach ulcers    30 YRS AGO  . Hypercholesteremia   . Hypertension   . Shoulder pain, left   . Umbilical hernia     Past Surgical History:  Procedure Laterality Date  . CARDIAC CATHETERIZATION  1997  . COLONOSCOPY    . UMBILICAL HERNIA REPAIR N/A 07/11/2015   Procedure: LAPAROSCOPIC ASSISTED OPEN UMBILICAL HERNIA;  Surgeon: Johnathan Hausen, MD;   Location: [facility];  Service: General;  Laterality: N/A;  With MESH    Family History  Problem Relation Age of Onset  . Heart disease Mother   . Diabetes Father   . Diabetes Sister    No colon cancer  Social History:  reports that he quit smoking  about 19 years ago. He has never used smokeless tobacco. He reports that he does not drink alcohol or use drugs.  Review of Systems:  He says that he is getting persistent numbness in his left/lowest part of the leg but no other sensory symptoms. He is usually not getting numbness on the right side He said that sometimes his left face may feel a little numb  Still has some abdominal symptoms of cramping at times  Last eye exam 5/17, normal  HYPERTENSION:  he has had long-standing hypertension Is  taking HCTZ in addition to his 5 mg amlodipine and Accupril 40 mg HCTZ was started in 4/16 when blood pressure was significantly high and associated with  Epistaxis Is also on Invokana Blood pressures recently well-controlled  BP Readings from Last 3 Encounters:  05/19/16 132/82  01/14/16 138/74  07/11/15 (!) 148/80     HYPERLIPIDEMIA: The lipid abnormality consists of elevated LDL.  Previously had been irregular with taking his Lipitor because of fear of side effects.  He  is taking his Lipitor regularly at the 20 mg dose   Has had some evidence of CAD in the past but asymptomatic  LDL has been controlled in 2017  Lab Results  Component Value Date   CHOL 142 01/16/2016   HDL 46.10 01/16/2016   LDLCALC 72 01/16/2016   LDLDIRECT 89.8 12/29/2013   TRIG 120.0 01/16/2016   CHOLHDL 3 01/16/2016          Examination:   BP 132/82 (BP Location: Left Arm, Patient Position: Sitting)   Pulse 89   Wt 188 lb (85.3 kg)   SpO2 98%   BMI 31.28 kg/m   Body mass index is 31.28 kg/m.   Diabetic Foot Exam - Simple   Simple Foot Form Diabetic Foot exam was performed with the following findings:  Yes   Visual Inspection No deformities,  no ulcerations, no other skin breakdown bilaterally:  Yes Sensation Testing Intact to touch and monofilament testing bilaterally:  Yes Pulse Check Posterior Tibialis and Dorsalis pulse intact bilaterally:  Yes Comments     ASSESSMENT/ PLAN:   Diabetes type 2   See history of present illness for detailed discussion of his current management, blood sugar patterns and problems identified  Blood sugars are overall well controlled and A1C is now 7%   This is despite taking only basal insulin and no mealtime insulin at breakfast and lunch He has checked only sporadic readings after meals and did not see any consistently high readings after supper recently on his download Has been able to do better with his control with cutting back on metformin and taking Invokana regularly However getting low normal or occasionally low readings overnight with increasing his Lantus to 35 on his own  He will get hyperglycemia after lunch on weekends when he is not as active  RECOMMENDATIONS:  Take 32 units Lantus.  He will need to take coverage for breakfast and lunch on weekends when he is not active, discussed mealtime dose adjustment and instructions given  Avoid high-fat or high carbohydrate meals especially at breakfast, needs more protein in the morning  More readings after meals about 2 hours later rather than bedtime  Encouraged him to continue with metformin and Invokana consistently  HYPERTENSION: His blood pressure is under  Control  Left lower leg numbness not documented on exam today and not clear what this is from, not clear also why he has occasional left face numbness. May possibly early neuropathy. Would  also recommend doing B12 level on the next visit  Discussed that he may need to be seen by neurologist, he wants to see a PCP first  Encouraged him to see her PCP for general medical care and evaluation of left-sided numbness  LIPIDS: Will need follow-up on the next  visit   Patient Instructions  When not walking and on Sunday take 20-25 Novolog at lunch; ? 5 at [place] blood sugars on waking up  3x weekly  Also check blood sugars about 2 hours after a meal and do this after different meals by rotation  Recommended blood sugar levels on waking up is 90-130 and about 2 hours after meal is 130-160  Please bring your blood sugar monitor to each visit, thank you   LANTUS 32 UNITS   Total visit time for evaluation and management of multiple problems = 25 minutes   Nayara Taplin 05/19/2016, 9:26 AM   Note: This office note was prepared with Estate agent. Any transcriptional errors that result from this process are unintentional.

## 2016-05-20 ENCOUNTER — Other Ambulatory Visit: Payer: Self-pay | Admitting: Endocrinology

## 2016-06-05 ENCOUNTER — Telehealth: Payer: Self-pay | Admitting: Endocrinology

## 2016-06-05 NOTE — Telephone Encounter (Signed)
Pt feels like he has the flu what can he take over the counter, he cannot reach his PCP  Muscles are hurting he has a fever, he feels like its been going on for a month now

## 2016-06-06 NOTE — Telephone Encounter (Signed)
Pt is aware.  

## 2016-06-06 NOTE — Telephone Encounter (Signed)
He can take Tylenol for muscle aches and fever, needs to be seen by his PCP or urgent care center if he has had a fever for a month

## 2016-06-07 ENCOUNTER — Other Ambulatory Visit: Payer: Self-pay | Admitting: Endocrinology

## 2016-07-07 ENCOUNTER — Other Ambulatory Visit: Payer: Self-pay | Admitting: Endocrinology

## 2016-07-23 ENCOUNTER — Other Ambulatory Visit: Payer: Self-pay | Admitting: Endocrinology

## 2016-08-05 ENCOUNTER — Other Ambulatory Visit: Payer: Self-pay | Admitting: Endocrinology

## 2016-08-07 ENCOUNTER — Other Ambulatory Visit: Payer: Self-pay | Admitting: Endocrinology

## 2016-09-04 ENCOUNTER — Other Ambulatory Visit: Payer: Self-pay

## 2016-09-04 MED ORDER — AMLODIPINE BESYLATE 5 MG PO TABS: 5.0000 mg | ORAL_TABLET | Freq: Every day | ORAL | 1 refills | Status: DC

## 2016-09-05 ENCOUNTER — Other Ambulatory Visit: Payer: Self-pay | Admitting: Endocrinology

## 2016-09-06 ENCOUNTER — Other Ambulatory Visit: Payer: Self-pay | Admitting: Endocrinology

## 2016-09-10 ENCOUNTER — Other Ambulatory Visit: Payer: Self-pay

## 2016-09-11 ENCOUNTER — Other Ambulatory Visit: Payer: BLUE CROSS/BLUE SHIELD

## 2016-09-15 ENCOUNTER — Ambulatory Visit: Payer: BLUE CROSS/BLUE SHIELD | Admitting: Endocrinology

## 2016-09-15 DIAGNOSIS — Z0289 Encounter for other administrative examinations: Secondary | ICD-10-CM

## 2016-09-16 ENCOUNTER — Encounter: Payer: Self-pay | Admitting: Endocrinology

## 2016-09-16 ENCOUNTER — Ambulatory Visit (INDEPENDENT_AMBULATORY_CARE_PROVIDER_SITE_OTHER): Payer: BLUE CROSS/BLUE SHIELD | Admitting: Endocrinology

## 2016-09-16 VITALS — BP 136/80 | HR 65 | Ht 64.5 in | Wt 188.4 lb

## 2016-09-16 DIAGNOSIS — I10 Essential (primary) hypertension: Secondary | ICD-10-CM | POA: Diagnosis not present

## 2016-09-16 DIAGNOSIS — E7800 Pure hypercholesterolemia, unspecified: Secondary | ICD-10-CM

## 2016-09-16 DIAGNOSIS — E1165 Type 2 diabetes mellitus with hyperglycemia: Secondary | ICD-10-CM | POA: Diagnosis not present

## 2016-09-16 DIAGNOSIS — Z794 Long term (current) use of insulin: Secondary | ICD-10-CM

## 2016-09-16 LAB — COMPREHENSIVE METABOLIC PANEL
ALBUMIN: 4.6 g/dL (ref 3.5–5.2)
ALT: 16 U/L (ref 0–53)
AST: 18 U/L (ref 0–37)
Alkaline Phosphatase: 87 U/L (ref 39–117)
BILIRUBIN TOTAL: 0.4 mg/dL (ref 0.2–1.2)
BUN: 14 mg/dL (ref 6–23)
CALCIUM: 9.6 mg/dL (ref 8.4–10.5)
CHLORIDE: 101 meq/L (ref 96–112)
CO2: 30 meq/L (ref 19–32)
Creatinine, Ser: 0.93 mg/dL (ref 0.40–1.50)
GFR: 106.02 mL/min (ref >60.00)
Glucose, Bld: 145 mg/dL — ABNORMAL HIGH (ref 70–99)
Potassium: 4.1 mEq/L (ref 3.5–5.1)
Sodium: 139 mEq/L (ref 135–145)
Total Protein: 7.5 g/dL (ref 6.0–8.3)

## 2016-09-16 LAB — LIPID PANEL
CHOL/HDL RATIO: 4
Cholesterol: 180 mg/dL (ref 0–200)
HDL: 48.4 mg/dL (ref >39.00)
LDL Cholesterol: 117 mg/dL — ABNORMAL HIGH (ref 0–99)
NonHDL: 131.72
TRIGLYCERIDES: 72 mg/dL (ref 0.0–149.0)
VLDL: 14.4 mg/dL (ref 0.0–40.0)

## 2016-09-16 LAB — POCT GLYCOSYLATED HEMOGLOBIN (HGB A1C): HEMOGLOBIN A1C: 7.1

## 2016-09-16 NOTE — Progress Notes (Signed)
Patient ID: Austin Norris, male   DOB: Sep 24, 1955, 61 y.o.   MRN: [id number]   Reason for Appointment: Diabetes follow-up   History of Present Illness   Diagnosis: Type 2 DIABETES MELITUS, long-standing     He has been on insulin to treat his diabetes for several years partly because of failure of oral agents and also for reducing cost of medications He is generally checking his blood sugars once or twice a day but does not keep a record Overall has had somewhat poor control with A1c consistently over 7%  Recent history:    Insulin regimen: Lantus  25-35 units at bedtime, Novolog 25-35 at supper  A1c is stable around 7-7.1 now  Current blood sugar patterns, management and problems identified:   His blood sugars were reviewed for the last 2 weeks  He started his past last Tuesday.  Previously appeared to have fairly good readings although checking mostly fasting and before supper   Since he has started his fasting is eating breakfast at about 4 AM and probably a larger meal at 8 PM  However he is not active between 4 AM and 8 AM when he goes to work; previously would not take NovoLog at breakfast because of feeling hypoglycemic when he got to work and started walking  On his own he has decreased his Lantus significantly since he is fasting because of tendency to low normal sugars at suppertime  Has only a few readings after meals but has a couple of readings over 200 after breakfast and also after supper  Fasting readings: Have been fairly good usually but higher the last 2 times possibly from eating larger meals at night   Oral hypoglycemic drugs: Invokana 100 mg, metformin ER 1 g daily     Side effects from medications: None  Proper timing of medications in relation to meals: Yes.          Monitors blood glucose:  at least  Once a day.    Glucometer: Contour Blood Glucose readings from download   Mean values apply above for all meters except median for  One Touch  PRE-MEAL Fasting Lunch Dinner Bedtime Overall  Glucose range: 70-318  158   78-1 44     Mean/median: 150   109   148    POST-MEAL PC Breakfast PC Lunch PC Dinner  Glucose range: 254, 237   136--260   Mean/median:        Meals: 2-3  meals per day, usually bread and milk in the morning, relatively smaller lunch, sometimes having eggs at lunchtime., sometimes sweets in pm, fried food occasionally        Dinner usually 5-6 PM   Physical activity: exercise: Mostly walking at work, is active in the parking lot of his car [place] visit: Most recent: None             Wt Readings from Last 3 Encounters:  09/16/16 188 lb 6.4 oz (85.5 kg)  05/19/16 188 lb (85.3 kg)  01/14/16 184 lb (83.5 kg)   LABS:  Lab Results  Component Value Date   HGBA1C 7.1 09/16/2016   HGBA1C 7.0 (H) 05/12/2016   HGBA1C 7.1 01/14/2016   Lab Results  Component Value Date   MICROALBUR 1.0 01/15/2016   LDLCALC 117 (H) 09/16/2016   CREATININE 0.93 09/16/2016      Allergies  as of 09/16/2016   No Known Allergies     Medication List       Accurate as of 09/16/16  4:26 PM. Always use your most recent med list.          amLODipine 5 MG tablet Commonly known as:  NORVASC TAKE 1 TABLET (5 MG TOTAL) BY MOUTH DAILY.   aspirin 81 MG tablet Take 81 mg by mouth daily.   atorvastatin 20 MG tablet Commonly known as:  LIPITOR TAKE 1 TABLET EVERY DAY   canagliflozin 300 MG Tabs tablet Commonly known as:  INVOKANA TAKE 1 TABLET (300 MG TOTAL) BY MOUTH DAILY BEFORE BREAKFAST.   glucose blood test strip Commonly known as:  BAYER CONTOUR NEXT TEST Use as instructed to check blood sugar 2 times per day   BAYER CONTOUR NEXT TEST test strip Generic drug:  glucose blood USE AS INSTRUCTED TO CHECK BLOOD SUGAR 2 TIMES PER DAY   hydrochlorothiazide 12.5 MG capsule Commonly known as:  MICROZIDE TAKE ONE CAPSULE BY MOUTH DAILY   HYDROcodone-acetaminophen 5-325 MG  tablet Commonly known as:  NORCO Take 1 tablet by mouth every 4 (four) hours as needed for moderate pain.   INSULIN SYRINGE 1CC/31GX5/16" 31G X 5/16" 1 ML Misc Use to inject lantus daily   LANTUS 100 UNIT/ML injection Generic drug:  insulin glargine INJECT 0.5 MLS (50 UNITS TOTAL) INTO THE SKIN AT BEDTIME.   metFORMIN 500 MG 24 hr tablet Commonly known as:  GLUCOPHAGE-XR TAKE 2 TABLETS DAILY WITH DINNER.   MICROLET LANCETS Misc Use to check blood sugar 2 times a day   NOVOLOG FLEXPEN 100 UNIT/ML FlexPen Generic drug:  insulin aspart INJECT 20 UNITS INTO THE SKIN 3 (THREE) TIMES DAILY WITH MEALS.   quinapril 40 MG tablet Commonly known as:  ACCUPRIL TAKE 1 TABLET (40 MG TOTAL) BY MOUTH AT BEDTIME.   tamsulosin 0.4 MG Caps capsule Commonly known as:  FLOMAX Take 0.4 mg by mouth at bedtime.       Allergies: No Known Allergies  Past Medical History:  Diagnosis Date  . Diabetes mellitus   . H/O epistaxis   . History of stomach ulcers    30 YRS AGO  . Hypercholesteremia   . Hypertension   . Shoulder pain, left   . Umbilical hernia     Past Surgical History:  Procedure Laterality Date  . CARDIAC CATHETERIZATION  1997  . COLONOSCOPY    . UMBILICAL HERNIA REPAIR N/A 07/11/2015   Procedure: LAPAROSCOPIC ASSISTED OPEN UMBILICAL HERNIA;  Surgeon: Johnathan Hausen, MD;  Location: [facility];  Service: General;  Laterality: N/A;  With MESH    Family History  Problem Relation Age of Onset  . Heart disease Mother   . Diabetes Father   . Diabetes Sister    No colon cancer  Social History:  reports that he quit smoking about 19 years ago. He has never used smokeless tobacco. He reports that he does not drink alcohol or use drugs.  Review of Systems:  He says that he is getting persistent numbness in his left/lowest part of the leg but no other sensory symptoms. He is usually not getting numbness on the right side He said that sometimes his left face may feel a little  numb  Still has some abdominal symptoms of cramping at times  Last eye exam 5/17, normal  HYPERTENSION:  he has had long-standing hypertension Is  taking HCTZ in addition to his 5 mg amlodipine and Accupril 40 mg  HCTZ was started in 4/16 when blood pressure was significantly high and associated with  Epistaxis Is also on Invokana Blood pressures recently well-controlled  BP Readings from Last 3 Encounters:  09/16/16 136/80  05/19/16 132/82  01/14/16 138/74     HYPERLIPIDEMIA: The lipid abnormality consists of elevated LDL.  Previously had been irregular with taking his Lipitor because of fear of side effects.  He  is taking his Lipitor regularly at the 20 mg dose   Has had some evidence of CAD in the past but asymptomatic  LDL has been controlled in 2017  Lab Results  Component Value Date   CHOL 180 09/16/2016   HDL 48.40 09/16/2016   LDLCALC 117 (H) 09/16/2016   LDLDIRECT 89.8 12/29/2013   TRIG 72.0 09/16/2016   CHOLHDL 4 09/16/2016          Examination:   BP 136/80   Pulse 65   Ht 5' 4.5" (1.638 m)   Wt 188 lb 6.4 oz (85.5 kg)   SpO2 98%   BMI 31.84 kg/m   Body mass index is 31.84 kg/m.    ASSESSMENT/ PLAN:   Diabetes type 2   See history of present illness for detailed discussion of his current management, blood sugar patterns and problems identified  Blood sugars are overall well controlled and A1C is now 7%   This is despite taking only basal insulin and no mealtime insulin at breakfast and lunch He has checked only sporadic readings after meals and did not see any consistently high readings after supper recently on his download Has been able to do better with his control with cutting back on metformin and taking Invokana regularly However getting low normal or occasionally low readings overnight with increasing his Lantus to 35 on his own  He will get hyperglycemia after lunch on weekends when he is not as active  RECOMMENDATIONS:  Take 32  units Lantus.  He will need to take coverage for breakfast and lunch on weekends when he is not active, discussed mealtime dose adjustment and instructions given  Avoid high-fat or high carbohydrate meals especially at breakfast, needs more protein in the morning  More readings after meals about 2 hours later rather than bedtime  Encouraged him to continue with metformin and Invokana consistently  HYPERTENSION: His blood pressure is    Control  Left lower leg numbness not documented on exam today and not clear what this is from, not clear also why he has occasional left face numbness. May possibly early neuropathy. Would also recommend doing B12 level on the next visit  Discussed that he may need to be seen by neurologist, he wants to see a PCP first  Encouraged him to see her PCP for general medical care and evaluation of left-sided numbness  LIPIDS: Will need follow-up on the next visit   Patient Instructions    More sugars about 2 hours after breakfast and dinner  Take at least 6 units of NOVOLOG before breakfast during your fast so that the blood sugars are not overall 180 after eating  Make reduce the Lantus to 20 units during your fast  Please establish with a family doctor for general medical care   Total visit time for evaluation and management of multiple problems = 25 minutes   [place] Selke 09/16/2016, 4:26 PM   Note: This office note was prepared with Dragon voice recognition system technology. Any transcriptional errors that result from this process are unintentional.  Patient ID: Austin Norris, male   DOB: [birth year], 61 y.o.   MRN: [id number]   Reason for Appointment: Diabetes follow-up   History of Present Illness   Diagnosis: Type 2 DIABETES MELITUS, long-standing     He has been on insulin to treat his diabetes for several years partly because of failure of oral agents and also for reducing cost of medications He is generally  checking his blood sugars once or twice a day but does not keep a record Overall has had somewhat poor control with A1c consistently over 7%  Recent history:    Insulin regimen: Lantus  35 units at bedtime, Novolog 30 at supper  A1c is better at 7.0 compared to 7.1 previously  Current blood sugar patterns, management and problems identified:   He was told to increase frequency of checking after meals but is doing only sporadic readings at bedtime; these appear to be generally fairly good with only one significantly high readings when he forgot his NovoLog at supper  He has adjusted his insulin doses on his own empirically; he has gone up on his Lantus 5 units because he thinks he needs more for his evening meal.  He was told to try Lantus in the morning but he thinks this causes low sugars when he is active  He does not take any Novolog for mealtime coverage on Sundays are his days off in blood sugars are higher in the afternoon and evening on these days  Again not checking blood sugars after meals usually during the weekday and his glucose was 182 after breakfast in the lab; he was not active that morning  Fasting readings: Have been fairly good usually with only one low normal reading of 76 However he has had a low blood sugar of 65 around 5 AM once  MEALTIME insulin: He is only taking Novolog at suppertime and not at breakfast or lunch again He still takes larger doses compared to his basal insulin He has numerous blood sugars in the evenings, sometimes several times in the same day and not clear which readings are after meals Blood sugars before and after supper are somewhat variable but on an average about 150  Postprandial readings: He is checking his blood sugar only rarely at lunchtime and these may be relatively higher  Blood sugars AFTER evening meal as above I generally well controlled with sporadic very high readings around 6-7 PM  Hypoglycemia: This will happen only  occasionally as above  Oral hypoglycemic drugs: Invokana 100 mg     Side effects from medications: None  Proper timing of medications in relation to meals: Yes.          Monitors blood glucose:  at least  Once a day.    Glucometer:  Blood Glucose readings from download   Mean values apply above for all meters except median for One Touch  PRE-MEAL Fasting Lunch Dinner Bedtime Overall  Glucose range:       Mean/median:     14 9   POST-MEAL PC Breakfast PC Lunch PC Dinner  Glucose range:     Mean/median:        Mean values apply above for all meters except median for One Touch  PRE-MEAL Fasting Lunch Dinner Bedtime Overall  Glucose range: 76-182  144, 176  112-193  96-325    Mean/median: 124        POST-MEAL PC Breakfast PC Lunch PC Dinner  Glucose range:     Mean/median:  AVERAGE 143, range 63-303         Meals: 1-2 meals per day, usually bread and milk in the morning, relatively smaller lunch, sometimes having eggs at lunchtime., sometimes sweets in pm, fried food occasionally        Dinner usually 5-6 PM   Physical activity: exercise: Mostly walking at work, is active in the parking lot of his car [place] visit: Most recent: None             Wt Readings from Last 3 Encounters:  09/16/16 188 lb 6.4 oz (85.5 kg)  05/19/16 188 lb (85.3 kg)  01/14/16 184 lb (83.5 kg)   LABS:  Lab Results  Component Value Date   HGBA1C 7.1 09/16/2016   HGBA1C 7.0 (H) 05/12/2016   HGBA1C 7.1 01/14/2016   Lab Results  Component Value Date   MICROALBUR 1.0 01/15/2016   LDLCALC 117 (H) 09/16/2016   CREATININE 0.93 09/16/2016      Allergies as of 09/16/2016   No Known Allergies     Medication List       Accurate as of 09/16/16  4:26 PM. Always use your most recent med list.          amLODipine 5 MG tablet Commonly known as:  NORVASC TAKE 1 TABLET (5 MG TOTAL) BY MOUTH DAILY.   aspirin 81 MG tablet Take 81 mg by mouth daily.     atorvastatin 20 MG tablet Commonly known as:  LIPITOR TAKE 1 TABLET EVERY DAY   canagliflozin 300 MG Tabs tablet Commonly known as:  INVOKANA TAKE 1 TABLET (300 MG TOTAL) BY MOUTH DAILY BEFORE BREAKFAST.   glucose blood test strip Commonly known as:  BAYER CONTOUR NEXT TEST Use as instructed to check blood sugar 2 times per day   BAYER CONTOUR NEXT TEST test strip Generic drug:  glucose blood USE AS INSTRUCTED TO CHECK BLOOD SUGAR 2 TIMES PER DAY   hydrochlorothiazide 12.5 MG capsule Commonly known as:  MICROZIDE TAKE ONE CAPSULE BY MOUTH DAILY   HYDROcodone-acetaminophen 5-325 MG tablet Commonly known as:  NORCO Take 1 tablet by mouth every 4 (four) hours as needed for moderate pain.   INSULIN SYRINGE 1CC/31GX5/16" 31G X 5/16" 1 ML Misc Use to inject lantus daily   LANTUS 100 UNIT/ML injection Generic drug:  insulin glargine INJECT 0.5 MLS (50 UNITS TOTAL) INTO THE SKIN AT BEDTIME.   metFORMIN 500 MG 24 hr tablet Commonly known as:  GLUCOPHAGE-XR TAKE 2 TABLETS DAILY WITH DINNER.   MICROLET LANCETS Misc Use to check blood sugar 2 times a day   NOVOLOG FLEXPEN 100 UNIT/ML FlexPen Generic drug:  insulin aspart INJECT 20 UNITS INTO THE SKIN 3 (THREE) TIMES DAILY WITH MEALS.   quinapril 40 MG tablet Commonly known as:  ACCUPRIL TAKE 1 TABLET (40 MG TOTAL) BY MOUTH AT BEDTIME.   tamsulosin 0.4 MG Caps capsule Commonly known as:  FLOMAX Take 0.4 mg by mouth at bedtime.       Allergies: No Known Allergies  Past Medical History:  Diagnosis Date  . Diabetes mellitus   . H/O epistaxis   . History of stomach ulcers    30 YRS AGO  . Hypercholesteremia   . Hypertension   . Shoulder pain, left   . Umbilical hernia     Past Surgical History:  Procedure Laterality Date  . CARDIAC CATHETERIZATION  1997  . COLONOSCOPY    .  UMBILICAL HERNIA REPAIR N/A 07/11/2015   Procedure: LAPAROSCOPIC ASSISTED OPEN UMBILICAL HERNIA;  Surgeon: Johnathan Hausen, MD;  Location: [facility];  Service: General;  Laterality: N/A;  With MESH    Family History  Problem Relation Age of Onset  . Heart disease Mother   . Diabetes Father   . Diabetes Sister    No colon cancer  Social History:  reports that he quit smoking about 19 years ago. He has never used smokeless tobacco. He reports that he does not drink alcohol or use drugs.  Review of Systems:  He says he had shingles when he was traveling to his home country about 2 months ago and is asking about vaccination  Last eye exam 5/17, normal  HYPERTENSION:  he has had long-standing hypertension Is  taking HCTZ  as also 5 mg amlodipine and Accupril 40 mg Is also on Invokana Blood pressure is recently well-controlled, was relatively high initially when he came in because of anxiety   BP Readings from Last 3 Encounters:  09/16/16 136/80  05/19/16 132/82  01/14/16 138/74     HYPERLIPIDEMIA: The lipid abnormality consists of elevated LDL.  Previously had been irregular with taking his Lipitor because of fear of side effects.  He  is taking his Lipitor regularly at the 20 mg dose   Has had some evidence of CAD in the past but asymptomatic  LDL has been controlled in 2017  Lab Results  Component Value Date   CHOL 180 09/16/2016   HDL 48.40 09/16/2016   LDLCALC 117 (H) 09/16/2016   LDLDIRECT 89.8 12/29/2013   TRIG 72.0 09/16/2016   CHOLHDL 4 09/16/2016    He has been prescribed tamsulosin by his urologist, presumably for BPH      Examination:   BP 136/80   Pulse 65   Ht 5' 4.5" (1.638 m)   Wt 188 lb 6.4 oz (85.5 kg)   SpO2 98%   BMI 31.84 kg/m   Body mass index is 31.84 kg/m.     ASSESSMENT/ PLAN:   Diabetes type 2   See history of present illness for detailed discussion of his current management, blood sugar patterns and problems identified  Blood sugars are overall well controlled and A1C is Consistently around 7%   Blood sugars are being assessed only for the last 2 weeks and these are  erratic because of his during the religious past Also probably not watching his diet is consistently with some high readings after evening meal He does not normally take mealtime insulin but appears to be needing coverage for his breakfast since he does not go to work until 4 hours later   RECOMMENDATIONS:  He will need to take coverage for breakfast during his religious fast since he is not active for the first 4 hours after breakfast and discussed needing to take at least 6-7 units and then adjusting the dose based on blood sugars after eating  Continue adjusting suppertime dose based on meal size, does need to try and avoid high-fat meals  Discussed blood sugar targets at various times  He needs to check his post prandial readings consistently and adjust his diet if getting high with certain foods, also consider consultation with dietitian again  Until this fast is over he can try taking only 20 units of Lantus since blood sugars tend to be low normal late afternoon, when he is off his fasting he will go back to the previous dose of Lantus but adjusted based on  fasting readings   Regular exercise on weekends  Encouraged him to continue with metformin and Invokana consistently, reassured him about safety of Invokana  HYPERTENSION: His blood pressure is fairly well controlled although higher on initial assessment today Encouraged him to monitor at home also  Advised him to see a PCP for general medical care  Since he recently reportedly had herpes zoster he can wait at least 6 months to get the vaccination  LIPIDS: Will need follow-up today  BPH: He will follow up with urologist for continued treatment and then he will of the prescriptions  Patient Instructions    More sugars about 2 hours after breakfast and dinner  Take at least 6 units of NOVOLOG before breakfast during your fast so that the blood sugars are not overall 180 after eating  Make reduce the Lantus to 20 units  during your fast  Please establish with a family doctor for general medical care   Counseling time on subjects discussed above is over 50% of today's 25 minute visit   [place] 09/16/2016, 4:26 PM   Note: This office note was prepared with Dragon voice recognition system technology. Any transcriptional errors that result from this process are unintentional.   ADDENDUM: His LDL is high, need to see if he is taking his Lipitor regularly

## 2016-09-16 NOTE — Progress Notes (Signed)
Please call and let them know that cholesterol is higher than usual, has he taken his atorvastatin regularly? Kidneys are fine, encourage him to sign up for my chart

## 2016-09-16 NOTE — Patient Instructions (Addendum)
  More sugars about 2 hours after breakfast and dinner  Take at least 6 units of NOVOLOG before breakfast during your fast so that the blood sugars are not overall 180 after eating  Make reduce the Lantus to 20 units during your fast  Please establish with a family doctor for general medical care

## 2016-09-17 ENCOUNTER — Encounter (INDEPENDENT_AMBULATORY_CARE_PROVIDER_SITE_OTHER): Payer: Self-pay

## 2016-09-18 ENCOUNTER — Other Ambulatory Visit: Payer: Self-pay

## 2016-09-18 MED ORDER — ATORVASTATIN CALCIUM 40 MG PO TABS: 40.0000 mg | ORAL_TABLET | Freq: Every day | ORAL | 3 refills | Status: DC

## 2016-10-05 ENCOUNTER — Other Ambulatory Visit: Payer: Self-pay | Admitting: Endocrinology

## 2016-10-19 ENCOUNTER — Other Ambulatory Visit: Payer: Self-pay | Admitting: Endocrinology

## 2016-10-27 ENCOUNTER — Other Ambulatory Visit: Payer: Self-pay

## 2016-10-27 MED ORDER — ATORVASTATIN CALCIUM 40 MG PO TABS: 40.0000 mg | ORAL_TABLET | Freq: Every day | ORAL | 3 refills | Status: DC

## 2016-11-23 ENCOUNTER — Other Ambulatory Visit: Payer: Self-pay | Admitting: Endocrinology

## 2016-12-04 ENCOUNTER — Other Ambulatory Visit: Payer: Self-pay | Admitting: Endocrinology

## 2016-12-12 ENCOUNTER — Other Ambulatory Visit (INDEPENDENT_AMBULATORY_CARE_PROVIDER_SITE_OTHER): Payer: BLUE CROSS/BLUE SHIELD

## 2016-12-12 DIAGNOSIS — Z794 Long term (current) use of insulin: Secondary | ICD-10-CM | POA: Diagnosis not present

## 2016-12-12 DIAGNOSIS — E1165 Type 2 diabetes mellitus with hyperglycemia: Secondary | ICD-10-CM | POA: Diagnosis not present

## 2016-12-12 LAB — LIPID PANEL
CHOL/HDL RATIO: 3
Cholesterol: 135 mg/dL (ref 0–200)
HDL: 47.4 mg/dL (ref >39.00)
LDL CALC: 62 mg/dL (ref 0–99)
NonHDL: 88.09
Triglycerides: 128 mg/dL (ref 0.0–149.0)
VLDL: 25.6 mg/dL (ref 0.0–40.0)

## 2016-12-12 LAB — BASIC METABOLIC PANEL
BUN: 12 mg/dL (ref 6–23)
CHLORIDE: 101 meq/L (ref 96–112)
CO2: 28 mEq/L (ref 19–32)
Calcium: 9.7 mg/dL (ref 8.4–10.5)
Creatinine, Ser: 0.83 mg/dL (ref 0.40–1.50)
GFR: 120.8 mL/min (ref >60.00)
Glucose, Bld: 219 mg/dL — ABNORMAL HIGH (ref 70–99)
Potassium: 4 mEq/L (ref 3.5–5.1)
Sodium: 137 mEq/L (ref 135–145)

## 2016-12-12 LAB — MICROALBUMIN / CREATININE URINE RATIO
Creatinine,U: 33.7 mg/dL
MICROALB/CREAT RATIO: 2.1 mg/g (ref 0.0–30.0)
Microalb, Ur: <0.7 mg/dL (ref 0.0–1.9)

## 2016-12-12 LAB — HEMOGLOBIN A1C: Hgb A1c MFr Bld: 6.8 % — ABNORMAL HIGH (ref 4.6–6.5)

## 2016-12-17 ENCOUNTER — Ambulatory Visit (INDEPENDENT_AMBULATORY_CARE_PROVIDER_SITE_OTHER): Payer: BLUE CROSS/BLUE SHIELD | Admitting: Endocrinology

## 2016-12-17 ENCOUNTER — Encounter: Payer: Self-pay | Admitting: Endocrinology

## 2016-12-17 VITALS — BP 144/80 | HR 90 | Ht 64.5 in | Wt 193.2 lb

## 2016-12-17 DIAGNOSIS — E1165 Type 2 diabetes mellitus with hyperglycemia: Secondary | ICD-10-CM

## 2016-12-17 DIAGNOSIS — I10 Essential (primary) hypertension: Secondary | ICD-10-CM

## 2016-12-17 DIAGNOSIS — E7800 Pure hypercholesterolemia, unspecified: Secondary | ICD-10-CM

## 2016-12-17 DIAGNOSIS — Z794 Long term (current) use of insulin: Secondary | ICD-10-CM

## 2016-12-17 NOTE — Patient Instructions (Addendum)
Lantus 48 units.  10 Novolog when not walking  Get coupon [email or web address].com  Check blood sugars on waking up  3/7  Also check blood sugars about 2 hours after a meal and do this after different meals by rotation  Recommended blood sugar levels on waking up is 90-130 and about 2 hours after meal is 130-160  Please bring your blood sugar monitor to each visit, thank you

## 2016-12-17 NOTE — Progress Notes (Signed)
Patient ID: Austin Norris, male   DOB: 12-18-1955, 61 y.o.   MRN: [id number]   Reason for Appointment: Diabetes follow-up   History of Present Illness   Diagnosis: Type 2 DIABETES MELITUS, long-standing     He has been on insulin to treat his diabetes for several years partly because of failure of oral agents and also for reducing cost of medications He is generally checking his blood sugars once or twice a day but does not keep a record Overall has had somewhat poor control with A1c consistently over 7%  Recent history:    Insulin regimen: Lantus  50 units at bedtime, Novolog 0-35 at supper  A1c is stable around 7-7.1 but now it is lower at 6.8   130-190 Am 80-110  Current blood sugar patterns, management and problems identified:   He did not bring his monitor for download and not clear how often he is checking.  Despite instructions to cover all his meals he does not take insulin for breakfast nor does he check his sugars after eating in the morning  His lab glucose after breakfast was over 200  However he claims that his blood sugars are all consistently below 200 even after evening meal  Also FASTING blood sugars are reportedly ranging from 80-110  He may occasionally feel hypoglycemic overnight especially if he is eating a light meal in the evening  Now he says that he will not take any insulin if he just eating salad and meat  However with any carbohydrate in evening take as much is 35 units NovoLog  Also he has gone up to 50 units on his Lantus on his own, previously had been taking mostly 30-35 units  He has gained weight  Despite reminders he does not do any formal exercise although recently is trying to walk a little  Fasting readings: Have been fairly good usually but higher the last 2 times possibly from eating larger meals at night   Oral hypoglycemic drugs: Invokana 100 mg, metformin ER 1 g daily     Side effects from medications:  None  Proper timing of medications in relation to meals: Yes.          Monitors blood glucose:  at least  Once a day.    Glucometer: Contour Blood Glucose readings from recall as above   Meals: 2-3  meals per day, usually some form of bread and milk in the morning, relatively smaller lunch, sometimes having eggs at lunchtime., sometimes sweets in pm, fried food occasionally        Dinner usually 5-6 PM   Physical activity: exercise: Mostly walking at work about 5 days a week, is active in the parking lot of his car [place] visit: Most recent: None             Wt Readings from Last 3 Encounters:  12/17/16 193 lb 3.2 oz (87.6 kg)  09/16/16 188 lb 6.4 oz (85.5 kg)  05/19/16 188 lb (85.3 kg)   LABS:  Lab Results  Component Value Date   HGBA1C 6.8 (H) 12/12/2016   HGBA1C 7.1 09/16/2016   HGBA1C 7.0 (H) 05/12/2016   Lab Results  Component Value Date   MICROALBUR <0.7 12/12/2016   LDLCALC 62 12/12/2016   CREATININE 0.83 12/12/2016    Other active problems: See review of systems   Allergies as of  12/17/2016   No Known Allergies     Medication List       Accurate as of 12/17/16  3:25 PM. Always use your most recent med list.          amLODipine 5 MG tablet Commonly known as:  NORVASC TAKE 1 TABLET (5 MG TOTAL) BY MOUTH DAILY.   aspirin 81 MG tablet Take 81 mg by mouth as needed.   atorvastatin 40 MG tablet Commonly known as:  LIPITOR Take 1 tablet (40 mg total) by mouth daily.   glucose blood test strip Commonly known as:  BAYER CONTOUR NEXT TEST Use as instructed to check blood sugar 2 times per day   BAYER CONTOUR NEXT TEST test strip Generic drug:  glucose blood USE AS INSTRUCTED TO CHECK BLOOD SUGAR 2 TIMES PER DAY   hydrochlorothiazide 12.5 MG capsule Commonly known as:  MICROZIDE TAKE ONE CAPSULE BY MOUTH DAILY   insulin glargine 100 UNIT/ML injection Commonly known as:  LANTUS 35 units at dinner time   INSULIN SYRINGE  1CC/31GX5/16" 31G X 5/16" 1 ML Misc Use to inject lantus daily   INVOKANA 300 MG Tabs tablet Generic drug:  canagliflozin TAKE 1 TABLET (300 MG TOTAL) BY MOUTH DAILY BEFORE BREAKFAST.   metFORMIN 500 MG 24 hr tablet Commonly known as:  GLUCOPHAGE-XR TAKE 2 TABLETS DAILY WITH DINNER.   MICROLET LANCETS Misc Use to check blood sugar 2 times a day   NOVOLOG FLEXPEN 100 UNIT/ML FlexPen Generic drug:  insulin aspart INJECT 20 UNITS INTO THE SKIN 3 (THREE) TIMES DAILY WITH MEALS.   quinapril 40 MG tablet Commonly known as:  ACCUPRIL TAKE 1 TABLET (40 MG TOTAL) BY MOUTH AT BEDTIME.   tamsulosin 0.4 MG Caps capsule Commonly known as:  FLOMAX Take 0.4 mg by mouth at bedtime.       Allergies: No Known Allergies  Past Medical History:  Diagnosis Date  . Diabetes mellitus   . H/O epistaxis   . History of stomach ulcers    30 YRS AGO  . Hypercholesteremia   . Hypertension   . Shoulder pain, left   . Umbilical hernia     Past Surgical History:  Procedure Laterality Date  . CARDIAC CATHETERIZATION  1997  . COLONOSCOPY    . UMBILICAL HERNIA REPAIR N/A 07/11/2015   Procedure: LAPAROSCOPIC ASSISTED OPEN UMBILICAL HERNIA;  Surgeon: Johnathan Hausen, MD;  Location: [facility];  Service: General;  Laterality: N/A;  With MESH    Family History  Problem Relation Age of Onset  . Heart disease Mother   . Diabetes Father   . Diabetes Sister    No colon cancer  Social History:  reports that he quit smoking about 19 years ago. He has never used smokeless tobacco. He reports that he does not drink alcohol or use drugs.  Review of Systems:  He says that he is getting persistent numbness in his left/lowest part of the leg but no other sensory symptoms. He is usually not getting numbness on the right side He said that sometimes his left face may feel a little numb  Still has some abdominal symptoms of cramping at times  Last eye exam 5/17, normal  HYPERTENSION:  he has had  long-standing hypertension Is  taking HCTZ in addition to his 5 mg amlodipine and Accupril 40 mg  HCTZ was started in 4/16 when blood pressure was significantly high and associated with Epistaxis Is also on Invokana  Blood pressure recently well-controlled  BP Readings  from Last 3 Encounters:  12/17/16 (!) 144/80  09/16/16 136/80  05/19/16 132/82     HYPERLIPIDEMIA: The lipid abnormality consists of elevated LDL.  Previously had been irregular with taking his Lipitor because of fear of side effects.  He  is taking his Lipitor regularly at the 20 mg dose   Has had some evidence of CAD in the past but asymptomatic  LDL has been controlled in 2017  Lab Results  Component Value Date   CHOL 135 12/12/2016   HDL 47.40 12/12/2016   LDLCALC 62 12/12/2016   LDLDIRECT 89.8 12/29/2013   TRIG 128.0 12/12/2016   CHOLHDL 3 12/12/2016          Examination:   BP (!) 144/80   Pulse 90   Ht 5' 4.5" (1.638 m)   Wt 193 lb 3.2 oz (87.6 kg)   SpO2 94%   BMI 32.65 kg/m   Body mass index is 32.65 kg/m.    ASSESSMENT/ PLAN:   Diabetes type 2   See history of present illness for detailed discussion of his current management, blood sugar patterns and problems identified  Blood sugars are overall well controlled and A1C is now 7%   This is despite taking only basal insulin and no mealtime insulin at breakfast and lunch He has checked only sporadic readings after meals and did not see any consistently high readings after supper recently on his download Has been able to do better with his control with cutting back on metformin and taking Invokana regularly However getting low normal or occasionally low readings overnight with increasing his Lantus to 35 on his own  He will get hyperglycemia after lunch on weekends when he is not as active  RECOMMENDATIONS:  Take 32 units Lantus.  He will need to take coverage for breakfast and lunch on weekends when he is not active, discussed  mealtime dose adjustment and instructions given  Avoid high-fat or high carbohydrate meals especially at breakfast, needs more protein in the morning  More readings after meals about 2 hours later rather than bedtime  Encouraged him to continue with metformin and Invokana consistently  HYPERTENSION: His blood pressure is    Control  Left lower leg numbness not documented on exam today and not clear what this is from, not clear also why he has occasional left face numbness. May possibly early neuropathy. Would also recommend doing B12 level on the next visit  Discussed that he may need to be seen by neurologist, he wants to see a PCP first  Encouraged him to see her PCP for general medical care and evaluation of left-sided numbness  LIPIDS: Will need follow-up on the next visit   Patient Instructions  Lantus 48 units.  10 Novolog when not walking  Get coupon Invokana.com  Check blood sugars on waking up  3/7  Also check blood sugars about 2 hours after a meal and do this after different meals by rotation  Recommended blood sugar levels on waking up is 90-130 and about 2 hours after meal is 130-160  Please bring your blood sugar monitor to each visit, thank you   Total visit time for evaluation and management of multiple problems = 25 minutes   Kessa Fairbairn 12/17/2016, 3:25 PM   Note: This office note was prepared with Estate agent. Any transcriptional errors that result from this process are unintentional.               Patient ID: Austin Norris,  male   DOB: 10/22/1955, 61 y.o.   MRN: [id number]   Reason for Appointment: Diabetes follow-up   History of Present Illness   Diagnosis: Type 2 DIABETES MELITUS, long-standing     He has been on insulin to treat his diabetes for several years partly because of failure of oral agents and also for reducing cost of medications He is generally checking his blood sugars once or twice a day  but does not keep a record Overall has had somewhat poor control with A1c consistently over 7%  Recent history:    Insulin regimen: Lantus  35 units at bedtime, Novolog 30 at supper  A1c is better at 7.0 compared to 7.1 previously  Current blood sugar patterns, management and problems identified:   He was told to increase frequency of checking after meals but is doing only sporadic readings at bedtime; these appear to be generally fairly good with only one significantly high readings when he forgot his NovoLog at supper  He has adjusted his insulin doses on his own empirically; he has gone up on his Lantus 5 units because he thinks he needs more for his evening meal.  He was told to try Lantus in the morning but he thinks this causes low sugars when he is active  He does not take any Novolog for mealtime coverage on Sundays are his days off in blood sugars are higher in the afternoon and evening on these days  Again not checking blood sugars after meals usually during the weekday and his glucose was 182 after breakfast in the lab; he was not active that morning  Fasting readings: Have been fairly good usually with only one low normal reading of 76 However he has had a low blood sugar of 65 around 5 AM once  MEALTIME insulin: He is only taking Novolog at suppertime and not at breakfast or lunch again He still takes larger doses compared to his basal insulin He has numerous blood sugars in the evenings, sometimes several times in the same day and not clear which readings are after meals Blood sugars before and after supper are somewhat variable but on an average about 150  Postprandial readings: He is checking his blood sugar only rarely at lunchtime and these may be relatively higher  Blood sugars AFTER evening meal as above I generally well controlled with sporadic very high readings around 6-7 PM  Hypoglycemia: This will happen only occasionally as above  Oral hypoglycemic  drugs: Invokana 100 mg     Side effects from medications: None  Proper timing of medications in relation to meals: Yes.          Monitors blood glucose:  at least  Once a day.    Glucometer:  Blood Glucose readings from download   Mean values apply above for all meters except median for One Touch  PRE-MEAL Fasting Lunch Dinner Bedtime Overall  Glucose range:       Mean/median:     14 9   POST-MEAL PC Breakfast PC Lunch PC Dinner  Glucose range:     Mean/median:        Mean values apply above for all meters except median for One Touch  PRE-MEAL Fasting Lunch Dinner Bedtime Overall  Glucose range: 76-182  144, 176  112-193  96-325    Mean/median: 124        POST-MEAL PC Breakfast PC Lunch PC Dinner  Glucose range:     Mean/median:  AVERAGE 143, range 63-303         Meals: 1-2 meals per day, usually bread and milk in the morning, relatively smaller lunch, sometimes having eggs at lunchtime., sometimes sweets in pm, fried food occasionally        Dinner usually 5-6 PM   Physical activity: exercise: Mostly walking at work, is active in the parking lot of his car [place] visit: Most recent: None             Wt Readings from Last 3 Encounters:  12/17/16 193 lb 3.2 oz (87.6 kg)  09/16/16 188 lb 6.4 oz (85.5 kg)  05/19/16 188 lb (85.3 kg)   LABS:  Lab Results  Component Value Date   HGBA1C 6.8 (H) 12/12/2016   HGBA1C 7.1 09/16/2016   HGBA1C 7.0 (H) 05/12/2016   Lab Results  Component Value Date   MICROALBUR <0.7 12/12/2016   LDLCALC 62 12/12/2016   CREATININE 0.83 12/12/2016      Allergies as of 12/17/2016   No Known Allergies     Medication List       Accurate as of 12/17/16  3:25 PM. Always use your most recent med list.          amLODipine 5 MG tablet Commonly known as:  NORVASC TAKE 1 TABLET (5 MG TOTAL) BY MOUTH DAILY.   aspirin 81 MG tablet Take 81 mg by mouth as needed.   atorvastatin 40 MG tablet Commonly  known as:  LIPITOR Take 1 tablet (40 mg total) by mouth daily.   glucose blood test strip Commonly known as:  BAYER CONTOUR NEXT TEST Use as instructed to check blood sugar 2 times per day   BAYER CONTOUR NEXT TEST test strip Generic drug:  glucose blood USE AS INSTRUCTED TO CHECK BLOOD SUGAR 2 TIMES PER DAY   hydrochlorothiazide 12.5 MG capsule Commonly known as:  MICROZIDE TAKE ONE CAPSULE BY MOUTH DAILY   insulin glargine 100 UNIT/ML injection Commonly known as:  LANTUS 35 units at dinner time   INSULIN SYRINGE 1CC/31GX5/16" 31G X 5/16" 1 ML Misc Use to inject lantus daily   INVOKANA 300 MG Tabs tablet Generic drug:  canagliflozin TAKE 1 TABLET (300 MG TOTAL) BY MOUTH DAILY BEFORE BREAKFAST.   metFORMIN 500 MG 24 hr tablet Commonly known as:  GLUCOPHAGE-XR TAKE 2 TABLETS DAILY WITH DINNER.   MICROLET LANCETS Misc Use to check blood sugar 2 times a day   NOVOLOG FLEXPEN 100 UNIT/ML FlexPen Generic drug:  insulin aspart INJECT 20 UNITS INTO THE SKIN 3 (THREE) TIMES DAILY WITH MEALS.   quinapril 40 MG tablet Commonly known as:  ACCUPRIL TAKE 1 TABLET (40 MG TOTAL) BY MOUTH AT BEDTIME.   tamsulosin 0.4 MG Caps capsule Commonly known as:  FLOMAX Take 0.4 mg by mouth at bedtime.       Allergies: No Known Allergies  Past Medical History:  Diagnosis Date  . Diabetes mellitus   . H/O epistaxis   . History of stomach ulcers    30 YRS AGO  . Hypercholesteremia   . Hypertension   . Shoulder pain, left   . Umbilical hernia     Past Surgical History:  Procedure Laterality Date  . CARDIAC CATHETERIZATION  1997  . COLONOSCOPY    . UMBILICAL HERNIA REPAIR N/A 07/11/2015   Procedure: LAPAROSCOPIC ASSISTED OPEN UMBILICAL HERNIA;  Surgeon: Johnathan Hausen, MD;  Location: [facility];  Service: General;  Laterality: N/A;  With MESH    Family History  Problem Relation Age of Onset  . Heart disease Mother   . Diabetes Father   . Diabetes Sister    No colon  cancer  Social History:  reports that he quit smoking about 19 years ago. He has never used smokeless tobacco. He reports that he does not drink alcohol or use drugs.  Review of Systems:   Last eye exam 5/17, normal  Last foot exam: 04/2016  HYPERTENSION:  he has had long-standing hypertension Is  taking HCTZ, 5 mg amlodipine and Accupril 40 mg Is also on Invokana  Blood pressure is usually well-controlled  BP Readings from Last 3 Encounters:  12/17/16 (!) 144/80  09/16/16 136/80  05/19/16 132/82     HYPERLIPIDEMIA: The lipid abnormality consists of elevated LDL.  Previously had been irregular with taking his Lipitor because of Various reasons Because of relatively high LDL he has been now given 40 mg of Lipitor with adequate control   Has had some evidence of CAD in the past but asymptomatic   Lab Results  Component Value Date   CHOL 135 12/12/2016   HDL 47.40 12/12/2016   LDLCALC 62 12/12/2016   LDLDIRECT 89.8 12/29/2013   TRIG 128.0 12/12/2016   CHOLHDL 3 12/12/2016    He has been prescribed tamsulosin by his urologist, presumably for BPH  He still has not established with a PCP      Examination:   BP (!) 144/80   Pulse 90   Ht 5' 4.5" (1.638 m)   Wt 193 lb 3.2 oz (87.6 kg)   SpO2 94%   BMI 32.65 kg/m   Body mass index is 32.65 kg/m.     ASSESSMENT/ PLAN:   Diabetes type 2   See history of present illness for detailed discussion of his current management, blood sugar patterns and problems identified  Blood sugars are overall well controlled and A1C is surprisingly lower at 6.8 This is despite some tendency to postprandial hyperglycemia  Also may be getting excessive amount of basal insulin with 50 units which is unusually high dose for him He tends to adjust his insulin on his own based on fasting blood sugars and not always taking mealtime coverage In the past has not usually tended to check his sugars after meals much  Likely to be benefiting  from [facility] also   RECOMMENDATIONS:  He will need to take coverage for breakfast on the days that he does not do any walking at work  He will try to check more readings after meals overall  If he has high readings after evening meal consistently he will need to take a minimum amount for all meals  He can at least reduce 2 units down on his Lantus since he reports low normal readings overnight and occasional hypoglycemia  Consider switching to [place] if covered on the next visit   Continue adjusting suppertime dose based on meal size, does need to try and avoid high-fat meals  Discussed blood sugar targets at various times   Regular exercise on his days off  Reminded him to continue with metformin and Invokana consistently  He is concerned about the out of pocket expense of Invokana and asked him to bring day coupon on the [place] website  HYPERTENSION: His blood pressure is fairly well controlled although higher systolic today  Advised him to see a PCP for general medical care   LIPIDS: Excellent control now with 40 mg Lipitor  and he is tolerating this well along with normal liver functions Will need follow-up twice a year   BPH: He will follow up with urologist for continued treatment and then he will of the prescriptions  Patient Instructions  Lantus 48 units.  10 Novolog when not walking  Get coupon Invokana.com  Check blood sugars on waking up  3/7  Also check blood sugars about 2 hours after a meal and do this after different meals by rotation  Recommended blood sugar levels on waking up is 90-130 and about 2 hours after meal is 130-160  Please bring your blood sugar monitor to each visit, thank you   Counseling time on subjects discussed above is over 50% of today's 25 minute visit   Brooklen Runquist 12/17/2016, 3:25 PM   Note: This office note was prepared with Estate agent. Any transcriptional errors that result from this  process are unintentional.

## 2017-01-02 ENCOUNTER — Other Ambulatory Visit: Payer: Self-pay | Admitting: Endocrinology

## 2017-01-04 ENCOUNTER — Other Ambulatory Visit: Payer: Self-pay | Admitting: Endocrinology

## 2017-02-08 ENCOUNTER — Other Ambulatory Visit: Payer: Self-pay | Admitting: Endocrinology

## 2017-03-04 ENCOUNTER — Other Ambulatory Visit: Payer: Self-pay | Admitting: Endocrinology

## 2017-03-16 LAB — HM DIABETES EYE EXAM

## 2017-03-22 ENCOUNTER — Other Ambulatory Visit: Payer: Self-pay | Admitting: Endocrinology

## 2017-04-01 ENCOUNTER — Other Ambulatory Visit: Payer: Self-pay | Admitting: Endocrinology

## 2017-04-04 ENCOUNTER — Other Ambulatory Visit: Payer: Self-pay | Admitting: Endocrinology

## 2017-05-06 ENCOUNTER — Other Ambulatory Visit: Payer: Self-pay | Admitting: Endocrinology

## 2017-05-07 ENCOUNTER — Other Ambulatory Visit: Payer: Self-pay | Admitting: Endocrinology

## 2017-05-09 ENCOUNTER — Other Ambulatory Visit: Payer: Self-pay | Admitting: Endocrinology

## 2017-05-15 ENCOUNTER — Other Ambulatory Visit: Payer: Self-pay | Admitting: Endocrinology

## 2017-05-27 ENCOUNTER — Other Ambulatory Visit: Payer: Self-pay | Admitting: Endocrinology

## 2017-06-04 ENCOUNTER — Other Ambulatory Visit: Payer: Self-pay | Admitting: Endocrinology

## 2017-06-05 ENCOUNTER — Other Ambulatory Visit: Payer: Self-pay | Admitting: Endocrinology

## 2017-07-04 ENCOUNTER — Other Ambulatory Visit: Payer: Self-pay | Admitting: Endocrinology

## 2017-07-14 ENCOUNTER — Other Ambulatory Visit: Payer: Self-pay | Admitting: Endocrinology

## 2017-07-20 ENCOUNTER — Telehealth: Payer: Self-pay | Admitting: Endocrinology

## 2017-07-20 ENCOUNTER — Ambulatory Visit: Payer: BLUE CROSS/BLUE SHIELD | Admitting: Endocrinology

## 2017-07-20 ENCOUNTER — Encounter: Payer: Self-pay | Admitting: Endocrinology

## 2017-07-20 VITALS — BP 138/76 | HR 79 | Ht 64.5 in | Wt 186.0 lb

## 2017-07-20 DIAGNOSIS — Z794 Long term (current) use of insulin: Secondary | ICD-10-CM

## 2017-07-20 DIAGNOSIS — E1165 Type 2 diabetes mellitus with hyperglycemia: Secondary | ICD-10-CM | POA: Diagnosis not present

## 2017-07-20 DIAGNOSIS — I10 Essential (primary) hypertension: Secondary | ICD-10-CM

## 2017-07-20 LAB — COMPREHENSIVE METABOLIC PANEL
ALK PHOS: 95 U/L (ref 39–117)
ALT: 19 U/L (ref 0–53)
AST: 21 U/L (ref 0–37)
Albumin: 4.4 g/dL (ref 3.5–5.2)
BUN: 12 mg/dL (ref 6–23)
CHLORIDE: 100 meq/L (ref 96–112)
CO2: 28 mEq/L (ref 19–32)
Calcium: 9.9 mg/dL (ref 8.4–10.5)
Creatinine, Ser: 0.77 mg/dL (ref 0.40–1.50)
GFR: 131.47 mL/min (ref >60.00)
GLUCOSE: 132 mg/dL — AB (ref 70–99)
POTASSIUM: 3.4 meq/L — AB (ref 3.5–5.1)
Sodium: 136 mEq/L (ref 135–145)
Total Bilirubin: 0.3 mg/dL (ref 0.2–1.2)
Total Protein: 7.9 g/dL (ref 6.0–8.3)

## 2017-07-20 LAB — LIPID PANEL
CHOL/HDL RATIO: 3
Cholesterol: 160 mg/dL (ref 0–200)
HDL: 54.6 mg/dL (ref >39.00)
LDL CALC: 88 mg/dL (ref 0–99)
NONHDL: 105.36
Triglycerides: 85 mg/dL (ref 0.0–149.0)
VLDL: 17 mg/dL (ref 0.0–40.0)

## 2017-07-20 LAB — HEMOGLOBIN A1C: Hgb A1c MFr Bld: 7.3 % — ABNORMAL HIGH (ref 4.6–6.5)

## 2017-07-20 MED ORDER — AMLODIPINE BESYLATE 10 MG PO TABS: 10.0000 mg | ORAL_TABLET | Freq: Every day | ORAL | 0 refills | Status: DC

## 2017-07-20 NOTE — Telephone Encounter (Signed)
Patient is stating that he is having high blood pressure and also has a cut on his foot and He would like to know if Dr Dwyane Dee can see him for this. There is no PCP listed for patient.   Please advise

## 2017-07-20 NOTE — Telephone Encounter (Signed)
Patient is aware 

## 2017-07-20 NOTE — Telephone Encounter (Signed)
Can you see him today he is here now please advise

## 2017-07-20 NOTE — Patient Instructions (Signed)
Take 2 pills of amlodipine daily  Triple antibiotic

## 2017-07-20 NOTE — Telephone Encounter (Signed)
I can see him for blood pressure but will not treat injuries

## 2017-07-20 NOTE — Progress Notes (Signed)
Patient ID: Austin Norris, male   DOB: May 29, 1955, 62 y.o.   MRN: [id number]          Chief complaint: High blood pressure  History of Present Illness:    Problem 1:  He has not been seen in follow-up since 8/18  He only recently started checking his blood pressure at home, not clear what brand of meter he is using However his systolic readings at home have been fluctuating between 155 and 170 He thinks he has been taking his medications and also Invokana quite regularly Last evening he took an extra dose of amlodipine 5 mg daily  He checks his blood pressure mostly on the left side He also think that he has been under more stress lately with his work Does not think he has been traveling or eating out more, not using excess salt also   BP Readings from Last 3 Encounters:  07/20/17 (!) 156/86  12/17/16 (!) 144/80  09/16/16 136/80   Wt Readings from Last 3 Encounters:  07/20/17 186 lb (84.4 kg)  12/17/16 193 lb 3.2 oz (87.6 kg)  09/16/16 188 lb 6.4 oz (85.5 kg)    Problem 2:  He is wanting to be checked for his injury on the right foot on the bottom last night on a piece of glass which was initially bleeding, not having any pain now    Allergies as of 07/20/2017   No Known Allergies     Medication List        Accurate as of 07/20/17  2:49 PM. Always use your most recent med list.          amLODipine 5 MG tablet Commonly known as:  NORVASC TAKE 1 TABLET (5 MG TOTAL) BY MOUTH DAILY.   aspirin 81 MG tablet Take 81 mg by mouth as needed.   atorvastatin 40 MG tablet Commonly known as:  LIPITOR Take 1 tablet (40 mg total) by mouth daily.   CONTOUR NEXT TEST test strip Generic drug:  glucose blood USE TO CHECK BLOOD SUGAR TWICE A DAY   hydrochlorothiazide 12.5 MG capsule Commonly known as:  MICROZIDE TAKE ONE CAPSULE BY MOUTH DAILY   insulin glargine 100 UNIT/ML injection Commonly known as:  LANTUS INJECT 35 UNITS AT DINNER TIME   INSULIN SYRINGE  1CC/31GX5/16" 31G X 5/16" 1 ML Misc Use to inject lantus daily   INVOKANA 300 MG Tabs tablet Generic drug:  canagliflozin TAKE 1 TABLET (300 MG TOTAL) BY MOUTH DAILY BEFORE BREAKFAST.   metFORMIN 500 MG 24 hr tablet Commonly known as:  GLUCOPHAGE-XR TAKE 2 TABLETS DAILY WITH DINNER.   MICROLET LANCETS Misc Use to check blood sugar 2 times a day   NOVOLOG FLEXPEN 100 UNIT/ML FlexPen Generic drug:  insulin aspart INJECT 20 UNITS INTO THE SKIN 3 TIMES A DAY WITH MEALS   quinapril 40 MG tablet Commonly known as:  ACCUPRIL TAKE 1 TABLET EVERY DAY AT BEDTIME   tamsulosin 0.4 MG Caps capsule Commonly known as:  FLOMAX Take 0.4 mg by mouth at bedtime.       Allergies: No Known Allergies  Past Medical History:  Diagnosis Date  . Diabetes mellitus   . H/O epistaxis   . History of stomach ulcers    30 YRS AGO  . Hypercholesteremia   . Hypertension   . Shoulder pain, left   . Umbilical hernia     Past Surgical History:  Procedure Laterality Date  . CARDIAC CATHETERIZATION  1997  . COLONOSCOPY    .  UMBILICAL HERNIA REPAIR N/A 07/11/2015   Procedure: LAPAROSCOPIC ASSISTED OPEN UMBILICAL HERNIA;  Surgeon: Johnathan Hausen, MD;  Location: [facility];  Service: General;  Laterality: N/A;  With MESH    Family History  Problem Relation Age of Onset  . Heart disease Mother   . Diabetes Father   . Diabetes Sister     Social History:  reports that he quit smoking about 20 years ago. He has never used smokeless tobacco. He reports that he does not drink alcohol or use drugs.   No visits with results within 1 Week(s) from this visit.  Latest known visit with results is:  Abstract on 06/10/2017  Component Date Value Ref Range Status  . HM Diabetic Eye Exam 03/16/2017 No Retinopathy  No Retinopathy Final    EXAM:  BP (!) 156/86 (BP Location: Left Arm, Patient Position: Sitting, Cuff Size: Normal)   Pulse 79   Ht 5' 4.5" (1.638 m)   Wt 186 lb (84.4 kg)   SpO2 96%   BMI 31.43  kg/m   Physical Exam  He has a clean looking 1 cm closed incision on the plantar surface of the right foot distally without any tenderness, surrounding erythema or ecchymosis  Assessment/Plan:   HYPERTENSION: Since his blood pressure is appearing higher with recent stress despite taking a 3 drug regimen will need to increase his dosage of Norvasc to 10 mg Blood pressure was relatively better on the second measurement today and may be improving from the Norvasc he took last night   He will use Neosporin and daily dressings on his foot incision  Discussed that he needs to have more regular follow-up for his diabetes and will have labs done today  Also reminded him that when he is on the religious fast he will reduce his Lantus and add a dose of NovoLog in the morning when he is eating early morning breakfast before dawn   Elayne Snare 07/20/2017, 2:49 PM

## 2017-07-21 NOTE — Telephone Encounter (Signed)
error 

## 2017-08-03 ENCOUNTER — Other Ambulatory Visit: Payer: Self-pay | Admitting: *Deleted

## 2017-08-03 MED ORDER — POTASSIUM CHLORIDE ER 10 MEQ PO TBCR: 10.0000 meq | EXTENDED_RELEASE_TABLET | Freq: Every day | ORAL | 0 refills | Status: DC

## 2017-08-21 ENCOUNTER — Other Ambulatory Visit: Payer: Self-pay | Admitting: Endocrinology

## 2017-08-24 ENCOUNTER — Other Ambulatory Visit: Payer: Self-pay | Admitting: Endocrinology

## 2017-08-28 ENCOUNTER — Other Ambulatory Visit: Payer: Self-pay | Admitting: Endocrinology

## 2017-08-29 ENCOUNTER — Other Ambulatory Visit: Payer: Self-pay | Admitting: Endocrinology

## 2017-08-30 NOTE — Progress Notes (Signed)
Patient ID: Austin Norris, male   DOB: 02-Sep-1955, 62 y.o.   MRN: [id number]   Reason for Appointment: Diabetes follow-up   History of Present Illness   Diagnosis: Type 2 DIABETES MELITUS, long-standing     He has been on insulin to treat his diabetes for several years partly because of failure of oral agents and also for reducing cost of medications He is generally checking his blood sugars once or twice a day but does not keep a record Overall has had somewhat poor control with A1c consistently over 7%  Recent history:    Insulin regimen: Lantus  25 units at bedtime, Novolog usually 35 at supper  Oral hypoglycemic drugs: Invokana 300mg , metformin ER 1 g daily  A1c is generally stable around 7 but in March was up to 7.3   Current blood sugar patterns, management and problems identified:   He says he is using 2 different glucose monitors but now using the contour sugars available only intermittently on his download  Since his last visit even though his A1c was higher he says his blood sugars came down around 110 and he started cutting back on his Lantus because of fear of glycemia at night  Month or so he has been taking about 25 units only compared to 50 units in the past, had been recommended 32 units on his previous diabetes visit  Lowest blood sugar is 61 early morning but no other hypoglycemia  Although his blood sugars have been relatively better this month his blood sugars are higher after supper  Not clear if he is taking his NovoLog consistently at suppertime and has not taken it the last couple of days at least for fear of nighttime hypoglycemia  Does not understand the duration of action of NovoLog  Also because of his religious fast starting today he did not take any Lantus yesterday and his fasting glucose was 186  Otherwise FASTING blood sugars are mostly around 140  No consistent pattern on his blood sugars although fasting readings appear to  be mostly slightly high  Fasting readings: Have been fairly good usually but higher the last 2 times possibly from eating larger meals at night       Side effects from medications: None  Proper timing of medications in relation to meals: Yes.          Monitors blood glucose:  at least  Once a day.    Glucometer: Contour Blood Glucose readings from download  Mean values apply above for all meters except median for One Touch  PRE-MEAL Fasting Lunch Dinner Bedtime Overall  Glucose range:  61-1 86  151 1 48-188    Mean/median:  158     7   POST-MEAL PC Breakfast PC Lunch PC Dinner  Glucose range:    132-284  Mean/median:    193      Meals: 2-3  meals per day, usually some form of bread and milk in the morning, relatively smaller lunch, sometimes having eggs at lunchtime., sometimes sweets in pm, fried food occasionally        Dinner usually 5-6 PM   Physical activity: exercise: Mostly walking at work about 5 days a week, is active in the parking lot of his car Austin Norris visit: Most recent: None             Wt Readings  from Last 3 Encounters:  08/31/17 185 lb (83.9 kg)  07/20/17 186 lb (84.4 kg)  12/17/16 193 lb 3.2 oz (87.6 kg)   LABS:  Lab Results  Component Value Date   HGBA1C 7.3 (H) 07/20/2017   HGBA1C 6.8 (H) 12/12/2016   HGBA1C 7.1 09/16/2016   Lab Results  Component Value Date   MICROALBUR <0.7 12/12/2016   [place] 88 07/20/2017   CREATININE 0.77 07/20/2017    Other active problems: See review of systems   Allergies as of 08/31/2017   No Known Allergies     Medication List        Accurate as of 08/31/17  9:38 AM. Always use your most recent med list.          amLODipine 10 MG tablet Commonly known as:  NORVASC TAKE 1 TABLET BY MOUTH EVERY DAY   atorvastatin 40 MG tablet Commonly known as:  LIPITOR Take 1 tablet (40 mg total) by mouth daily.   CONTOUR NEXT TEST test strip Generic drug:  glucose blood USE TO CHECK  BLOOD SUGAR TWICE A DAY   hydrochlorothiazide 12.5 MG capsule Commonly known as:  MICROZIDE TAKE ONE CAPSULE BY MOUTH DAILY   insulin glargine 100 UNIT/ML injection Commonly known as:  LANTUS INJECT 35 UNITS AT DINNER TIME   INSULIN SYRINGE 1CC/31GX5/16" 31G X 5/16" 1 ML Misc Use to inject lantus daily   INVOKANA 300 MG Tabs tablet Generic drug:  canagliflozin TAKE 1 TABLET (300 MG TOTAL) BY MOUTH DAILY BEFORE BREAKFAST.   metFORMIN 500 MG 24 hr tablet Commonly known as:  GLUCOPHAGE-XR TAKE 2 TABLETS DAILY WITH DINNER.   MICROLET LANCETS Misc Use to check blood sugar 2 times a day   NOVOLOG FLEXPEN 100 UNIT/ML FlexPen Generic drug:  insulin aspart INJECT 20 UNITS INTO THE SKIN 3 TIMES A DAY WITH MEALS   potassium chloride 10 MEQ tablet Commonly known as:  K-DUR Take 1 tablet (10 mEq total) by mouth daily.   quinapril 40 MG tablet Commonly known as:  ACCUPRIL TAKE 1 TABLET EVERY DAY AT BEDTIME   tamsulosin 0.4 MG Caps capsule Commonly known as:  FLOMAX Take 0.4 mg by mouth at bedtime.       Allergies: No Known Allergies  Past Medical History:  Diagnosis Date  . Diabetes mellitus   . H/O epistaxis   . History of stomach ulcers    30 YRS AGO  . Hypercholesteremia   . Hypertension   . Shoulder pain, left   . Umbilical hernia     Past Surgical History:  Procedure Laterality Date  . CARDIAC CATHETERIZATION  1997  . COLONOSCOPY    . UMBILICAL HERNIA REPAIR N/A 07/11/2015   Procedure: LAPAROSCOPIC ASSISTED OPEN UMBILICAL HERNIA;  Surgeon: Johnathan Hausen, MD;  Location: [facility];  Service: General;  Laterality: N/A;  With MESH    Family History  Problem Relation Age of Onset  . Heart disease Mother   . Diabetes Father   . Diabetes Sister    No colon cancer  Social History:  reports that he quit smoking about 20 years ago. He has never used smokeless tobacco. He reports that he does not drink alcohol or use drugs.  Review of Systems:   Last eye exam  10/18  HYPERTENSION:  he has had long-standing hypertension Is  taking HCTZ in addition to his 10 mg amlodipine and Accupril 40 mg Amlodipine was increased 3/19 No headaches  HCTZ was started in 07/2014 when blood pressure was  significantly high and associated with Epistaxis Is also on Invokana  Blood pressure recently well-controlled, recently 810 systolic at home He is using a wrist cuff  BP Readings from Last 3 Encounters:  08/31/17 140/80  07/20/17 138/76  12/17/16 (!) 144/80     HYPERLIPIDEMIA: The lipid abnormality consists of elevated LDL.    He  is taking his Lipitor regularly at the 40 mg dose, previously on lower doses   Has had some evidence of CAD in the past but asymptomatic  LDL has been controlled in 3/19  Lab Results  Component Value Date   CHOL 160 07/20/2017   HDL 54.60 07/20/2017   LDLCALC 88 07/20/2017   LDLDIRECT 89.8 12/29/2013   TRIG 85.0 07/20/2017   CHOLHDL 3 07/20/2017          Examination:   BP 140/80 (BP Location: Left Arm, Patient Position: Sitting, Cuff Size: Normal)   Pulse 65   Ht 5' 4.5" (1.638 m)   Wt 185 lb (83.9 kg)   SpO2 96%   BMI 31.26 kg/m   Body mass index is 31.26 kg/m.    ASSESSMENT/ PLAN:   Diabetes type 2 insulin-dependent  See history of present illness for detailed discussion of his current management, blood sugar patterns and problems identified  Blood sugars are variably controlled with A1c last 7.3  Blood sugars appear to be variable especially after evening meal and he is arbitrarily adjusting his mealtime and basal insulins quite frequently  Recently with taking only 25 minutes of Lantus his fasting readings are mildly high  However since he is starting his religious fast not clear what his basal insulin requirement is He is not taking any for the last couple of days but blood sugar is going up to 180 in the morning his not understanding the duration of action of both basal and bolus insulin  doses His diet is variable and now because of his religious fast he is eating somewhat differently and at different times than before  RECOMMENDATIONS:  Take at least 15 units of Lantus while he is fasting since he will have some basal insulin requirement  Discussed that his Lantus will be adjusted based on his fasting readings and will not cause hypoglycemia  He will need to take NovoLog consistently with all meals and may take a reduced dose in the evening if he is not wanting to take 35 units blood sugars after 2 hours should be at least under 180 usually  Discussed that the A1c is high because of postprandial spikes monitor readings after meals more consistently than before  Encouraged him to continue with metformin and Invokana and reassured him about the safety of Invokana  HYPERTENSION: His blood pressure is better controlled with 10 mg amlodipine and we need to continue the same  Given him phone numbers to contact PCP for general care  Aspirin prophylaxis: He can start this back since his epistaxis previously was related to a bleeding vessel that was cauterized   LIPIDS: Adequately controlled as of 06/2017 Will need follow-up on the next visit  Counseling time on subjects discussed in assessment and plan sections is over 50% of today's 25 minute visit  There are no Patient Instructions on file for this visit.    Elayne Snare 08/31/2017, 9:38 AM   Note: This office note was prepared with Dragon voice recognition system technology. Any transcriptional errors that result from this process are unintentional.

## 2017-08-31 ENCOUNTER — Ambulatory Visit: Payer: BLUE CROSS/BLUE SHIELD | Admitting: Endocrinology

## 2017-08-31 ENCOUNTER — Encounter: Payer: Self-pay | Admitting: Endocrinology

## 2017-08-31 VITALS — BP 140/80 | HR 65 | Ht 64.5 in | Wt 185.0 lb

## 2017-08-31 DIAGNOSIS — I10 Essential (primary) hypertension: Secondary | ICD-10-CM | POA: Diagnosis not present

## 2017-08-31 DIAGNOSIS — Z794 Long term (current) use of insulin: Secondary | ICD-10-CM

## 2017-08-31 DIAGNOSIS — E1165 Type 2 diabetes mellitus with hyperglycemia: Secondary | ICD-10-CM

## 2017-08-31 DIAGNOSIS — E7800 Pure hypercholesterolemia, unspecified: Secondary | ICD-10-CM | POA: Diagnosis not present

## 2017-08-31 NOTE — Patient Instructions (Signed)
Dinner Novolog 25 units daily  Lantus 15 during fast

## 2017-09-02 ENCOUNTER — Other Ambulatory Visit: Payer: Self-pay | Admitting: Endocrinology

## 2017-09-20 ENCOUNTER — Other Ambulatory Visit: Payer: Self-pay | Admitting: Endocrinology

## 2017-09-22 ENCOUNTER — Other Ambulatory Visit: Payer: Self-pay | Admitting: Endocrinology

## 2017-09-25 ENCOUNTER — Other Ambulatory Visit: Payer: Self-pay | Admitting: Endocrinology

## 2017-10-09 ENCOUNTER — Other Ambulatory Visit: Payer: Self-pay | Admitting: Endocrinology

## 2017-10-20 ENCOUNTER — Ambulatory Visit: Payer: BLUE CROSS/BLUE SHIELD | Admitting: Endocrinology

## 2017-10-20 ENCOUNTER — Encounter: Payer: Self-pay | Admitting: Endocrinology

## 2017-10-20 VITALS — BP 142/78 | HR 77 | Ht 64.5 in | Wt 189.9 lb

## 2017-10-20 DIAGNOSIS — E1165 Type 2 diabetes mellitus with hyperglycemia: Secondary | ICD-10-CM

## 2017-10-20 DIAGNOSIS — M25473 Effusion, unspecified ankle: Secondary | ICD-10-CM

## 2017-10-20 DIAGNOSIS — Z794 Long term (current) use of insulin: Secondary | ICD-10-CM | POA: Diagnosis not present

## 2017-10-20 LAB — COMPREHENSIVE METABOLIC PANEL
ALK PHOS: 84 U/L (ref 39–117)
ALT: 24 U/L (ref 0–53)
AST: 21 U/L (ref 0–37)
Albumin: 4.2 g/dL (ref 3.5–5.2)
BILIRUBIN TOTAL: 0.3 mg/dL (ref 0.2–1.2)
BUN: 13 mg/dL (ref 6–23)
CO2: 29 meq/L (ref 19–32)
CREATININE: 1.12 mg/dL (ref 0.40–1.50)
Calcium: 9.2 mg/dL (ref 8.4–10.5)
Chloride: 98 mEq/L (ref 96–112)
GFR: 85.25 mL/min (ref >60.00)
GLUCOSE: 231 mg/dL — AB (ref 70–99)
Potassium: 4.1 mEq/L (ref 3.5–5.1)
Sodium: 135 mEq/L (ref 135–145)
TOTAL PROTEIN: 7 g/dL (ref 6.0–8.3)

## 2017-10-20 NOTE — Patient Instructions (Signed)
Wear support socks

## 2017-10-20 NOTE — Progress Notes (Signed)
Patient ID: Austin Norris, male   DOB: July 03, 1955, 62 y.o.   MRN: [id number]          Chief complaint: Leg swelling  History of Present Illness:   For the last 3 to 4 days he has had some swelling of his ankle area and this is more on the right side He says that yesterday he was sitting more However today on his own he has been wearing some knee-high support socks and his swelling appears to be better He has not had any pain in his calf area No increase in sodium intake  He says blood pressure has been still fairly well controlled with most readings around 130 but range 125-140 His amlodipine had been previously increased to 10 mg because of increased blood pressure and he continues to take HCTZ also    Allergies as of 10/20/2017   No Known Allergies     Medication List        Accurate as of 10/20/17  2:45 PM. Always use your most recent med list.          amLODipine 10 MG tablet Commonly known as:  NORVASC TAKE 1 TABLET BY MOUTH EVERY DAY   atorvastatin 40 MG tablet Commonly known as:  LIPITOR Take 1 tablet (40 mg total) by mouth daily.   CONTOUR NEXT TEST test strip Generic drug:  glucose blood USE TO CHECK BLOOD SUGAR TWICE A DAY   hydrochlorothiazide 12.5 MG capsule Commonly known as:  MICROZIDE TAKE ONE CAPSULE BY MOUTH DAILY   insulin glargine 100 UNIT/ML injection Commonly known as:  LANTUS INJECT 35 UNITS AT DINNER TIME   INSULIN SYRINGE 1CC/31GX5/16" 31G X 5/16" 1 ML Misc Use to inject lantus daily   INVOKANA 300 MG Tabs tablet Generic drug:  canagliflozin TAKE 1 TABLET (300 MG TOTAL) BY MOUTH DAILY BEFORE BREAKFAST.   metFORMIN 500 MG 24 hr tablet Commonly known as:  GLUCOPHAGE-XR TAKE 2 TABLETS DAILY WITH DINNER.   MICROLET LANCETS Misc Use to check blood sugar 2 times a day   NOVOLOG FLEXPEN 100 UNIT/ML FlexPen Generic drug:  insulin aspart INJECT 20 UNITS INTO THE SKIN 3 TIMES A DAY WITH MEALS   potassium chloride 10 MEQ  tablet Commonly known as:  K-DUR Take 1 tablet (10 mEq total) by mouth daily.   quinapril 40 MG tablet Commonly known as:  ACCUPRIL TAKE 1 TABLET EVERY DAY AT BEDTIME   tamsulosin 0.4 MG Caps capsule Commonly known as:  FLOMAX Take 0.4 mg by mouth at bedtime.       Allergies: No Known Allergies  Past Medical History:  Diagnosis Date  . Diabetes mellitus   . H/O epistaxis   . History of stomach ulcers    30 YRS AGO  . Hypercholesteremia   . Hypertension   . Shoulder pain, left   . Umbilical hernia     Past Surgical History:  Procedure Laterality Date  . CARDIAC CATHETERIZATION  1997  . COLONOSCOPY    . UMBILICAL HERNIA REPAIR N/A 07/11/2015   Procedure: LAPAROSCOPIC ASSISTED OPEN UMBILICAL HERNIA;  Surgeon: Johnathan Hausen, MD;  Location: [facility];  Service: General;  Laterality: N/A;  With MESH    Family History  Problem Relation Age of Onset  . Heart disease Mother   . Diabetes Father   . Diabetes Sister     Social History:  reports that he quit smoking about 20 years ago. He has never used smokeless tobacco. He reports that he  does not drink alcohol or use drugs.   No visits with results within 1 Week(s) from this visit.  Latest known visit with results is:  Office Visit on 07/20/2017  Component Date Value Ref Range Status  . Cholesterol 07/20/2017 160  0 - 200 mg/dL Final   ATP III Classification       Desirable:  < 200 mg/dL               Borderline High:  200 - 239 mg/dL          High:  > = 240 mg/dL  . Triglycerides 07/20/2017 85.0  0.0 - 149.0 mg/dL Final   Normal:  <150 mg/dLBorderline High:  150 - 199 mg/dL  . HDL 07/20/2017 54.60  >39.00 mg/dL Final  . VLDL 07/20/2017 17.0  0.0 - 40.0 mg/dL Final  . LDL Cholesterol 07/20/2017 88  0 - 99 mg/dL Final  . Total CHOL/HDL Ratio 07/20/2017 3   Final                  Men          Women1/2 Average Risk     3.4          3.3Average Risk          5.0          4.42X Average Risk          9.6          7.13X Average  Risk          15.0          11.0                      . NonHDL 07/20/2017 105.36   Final   NOTE:  Non-HDL goal should be 30 mg/dL higher than patient's LDL goal (i.e. LDL goal of < 70 mg/dL, would have non-HDL goal of < 100 mg/dL)  . Sodium 07/20/2017 136  135 - 145 mEq/L Final  . Potassium 07/20/2017 3.4* 3.5 - 5.1 mEq/L Final  . Chloride 07/20/2017 100  96 - 112 mEq/L Final  . CO2 07/20/2017 28  19 - 32 mEq/L Final  . Glucose, Bld 07/20/2017 132* 70 - 99 mg/dL Final  . BUN 07/20/2017 12  6 - 23 mg/dL Final  . Creatinine, Ser 07/20/2017 0.77  0.40 - 1.50 mg/dL Final  . Total Bilirubin 07/20/2017 0.3  0.2 - 1.2 mg/dL Final  . Alkaline Phosphatase 07/20/2017 95  39 - 117 U/L Final  . AST 07/20/2017 21  0 - 37 U/L Final  . ALT 07/20/2017 19  0 - 53 U/L Final  . Total Protein 07/20/2017 7.9  6.0 - 8.3 g/dL Final  . Albumin 07/20/2017 4.4  3.5 - 5.2 g/dL Final  . Calcium 07/20/2017 9.9  8.4 - 10.5 mg/dL Final  . GFR 07/20/2017 131.47  >60.00 mL/min Final  . Hgb A1c MFr Bld 07/20/2017 7.3* 4.6 - 6.5 % Final   Glycemic Control Guidelines for People with Diabetes:Non Diabetic:  <6%Goal of Therapy: <7%Additional Action Suggested:  >8%     EXAM:  BP (!) 142/78 (BP Location: Left Arm, Patient Position: Sitting, Cuff Size: Normal)   Pulse 77   Ht 5' 4.5" (1.638 m)   Wt 189 lb 14.4 oz (86.1 kg)   SpO2 98%   BMI 32.09 kg/m   Physical Exam  1+ right ankle edema Has trace left pretibial edema also Feet are normal to  inspection No calf tenderness  Assessment/Plan:   Dependent pedal edema, mostly on the right and may be related to taking amlodipine along with vasodilatation from heat and also decreased physical activity yesterday  Discussed that since he is only having mild edema today and reducing the amlodipine may increase his blood pressure he will continue to wear support stockings and elevate the feet when he is sitting down  We will check his renal function also today   Elayne Snare 10/20/2017, 2:45 PM

## 2017-10-26 ENCOUNTER — Other Ambulatory Visit: Payer: Self-pay | Admitting: Endocrinology

## 2017-10-29 ENCOUNTER — Other Ambulatory Visit: Payer: Self-pay | Admitting: Endocrinology

## 2017-11-02 ENCOUNTER — Other Ambulatory Visit: Payer: Self-pay

## 2017-11-02 ENCOUNTER — Telehealth: Payer: Self-pay

## 2017-11-02 MED ORDER — DILTIAZEM HCL ER 240 MG PO CP24: 240.0000 mg | ORAL_CAPSULE | Freq: Every day | ORAL | 3 refills | Status: DC

## 2017-11-02 NOTE — Telephone Encounter (Signed)
Do you want to change BP medication?

## 2017-11-02 NOTE — Telephone Encounter (Signed)
Patient requesting to speak to Dr. Jodelle Green nurse- patient is requesting to change BP medication because he is having trouble with his feet swelling- good number to reach is [phone]

## 2017-11-02 NOTE — Telephone Encounter (Signed)
New Rx sent.

## 2017-11-02 NOTE — Telephone Encounter (Signed)
He is likely referring to changing amlodipine because of swelling.  New prescription will be for diltiazem ER 240 mg daily

## 2017-11-22 ENCOUNTER — Other Ambulatory Visit: Payer: Self-pay | Admitting: Endocrinology

## 2017-11-30 ENCOUNTER — Other Ambulatory Visit: Payer: BLUE CROSS/BLUE SHIELD

## 2017-12-03 ENCOUNTER — Other Ambulatory Visit: Payer: Self-pay | Admitting: Endocrinology

## 2017-12-07 ENCOUNTER — Ambulatory Visit: Payer: BLUE CROSS/BLUE SHIELD | Admitting: Endocrinology

## 2017-12-07 DIAGNOSIS — Z0289 Encounter for other administrative examinations: Secondary | ICD-10-CM

## 2017-12-30 ENCOUNTER — Other Ambulatory Visit: Payer: Self-pay | Admitting: Endocrinology

## 2018-01-13 ENCOUNTER — Other Ambulatory Visit: Payer: Self-pay | Admitting: Endocrinology

## 2018-01-13 NOTE — Telephone Encounter (Signed)
Pt must sched appt first then we can send in enough untill that appt/has no-showed twiced/thx dmf

## 2018-01-18 ENCOUNTER — Other Ambulatory Visit: Payer: Self-pay | Admitting: Endocrinology

## 2018-01-23 ENCOUNTER — Other Ambulatory Visit: Payer: Self-pay | Admitting: Endocrinology

## 2018-01-24 ENCOUNTER — Other Ambulatory Visit: Payer: Self-pay | Admitting: Endocrinology

## 2018-01-27 ENCOUNTER — Other Ambulatory Visit: Payer: Self-pay | Admitting: Endocrinology

## 2018-01-28 ENCOUNTER — Telehealth: Payer: Self-pay | Admitting: Endocrinology

## 2018-01-28 NOTE — Telephone Encounter (Signed)
-----   Message from Elayne Snare, MD sent at 01/27/2018  9:09 PM EDT ----- Regarding: Follow-up He does not have a follow-up appointment scheduled.  Please schedule follow-up, first available appointment with labs a couple of days before, thanks

## 2018-01-30 ENCOUNTER — Other Ambulatory Visit: Payer: Self-pay | Admitting: Endocrinology

## 2018-02-10 ENCOUNTER — Other Ambulatory Visit: Payer: Self-pay | Admitting: Endocrinology

## 2018-02-16 ENCOUNTER — Other Ambulatory Visit: Payer: Self-pay | Admitting: Endocrinology

## 2018-02-24 ENCOUNTER — Other Ambulatory Visit: Payer: Self-pay | Admitting: Endocrinology

## 2018-03-07 ENCOUNTER — Other Ambulatory Visit: Payer: Self-pay | Admitting: Endocrinology

## 2018-03-13 ENCOUNTER — Other Ambulatory Visit: Payer: Self-pay | Admitting: Endocrinology

## 2018-03-20 ENCOUNTER — Other Ambulatory Visit: Payer: Self-pay | Admitting: Endocrinology

## 2018-03-21 ENCOUNTER — Other Ambulatory Visit: Payer: Self-pay | Admitting: Endocrinology

## 2018-03-30 ENCOUNTER — Other Ambulatory Visit (INDEPENDENT_AMBULATORY_CARE_PROVIDER_SITE_OTHER): Payer: BLUE CROSS/BLUE SHIELD

## 2018-03-30 DIAGNOSIS — E1165 Type 2 diabetes mellitus with hyperglycemia: Secondary | ICD-10-CM | POA: Diagnosis not present

## 2018-03-30 DIAGNOSIS — I10 Essential (primary) hypertension: Secondary | ICD-10-CM

## 2018-03-30 DIAGNOSIS — Z794 Long term (current) use of insulin: Secondary | ICD-10-CM

## 2018-03-30 DIAGNOSIS — E7800 Pure hypercholesterolemia, unspecified: Secondary | ICD-10-CM | POA: Diagnosis not present

## 2018-03-30 LAB — LIPID PANEL
CHOLESTEROL: 145 mg/dL (ref 0–200)
HDL: 51.6 mg/dL (ref >39.00)
LDL Cholesterol: 66 mg/dL (ref 0–99)
NonHDL: 93.7
Total CHOL/HDL Ratio: 3
Triglycerides: 138 mg/dL (ref 0.0–149.0)
VLDL: 27.6 mg/dL (ref 0.0–40.0)

## 2018-03-30 LAB — MICROALBUMIN / CREATININE URINE RATIO
Creatinine,U: 53.9 mg/dL
MICROALB/CREAT RATIO: 3.2 mg/g (ref 0.0–30.0)
Microalb, Ur: 1.7 mg/dL (ref 0.0–1.9)

## 2018-03-30 LAB — HEMOGLOBIN A1C: HEMOGLOBIN A1C: 7.2 % — AB (ref 4.6–6.5)

## 2018-03-31 ENCOUNTER — Other Ambulatory Visit: Payer: Self-pay | Admitting: Endocrinology

## 2018-03-31 DIAGNOSIS — I10 Essential (primary) hypertension: Secondary | ICD-10-CM

## 2018-03-31 LAB — COMPREHENSIVE METABOLIC PANEL
ALBUMIN: 4.6 g/dL (ref 3.5–5.2)
ALT: 20 U/L (ref 0–53)
AST: 19 U/L (ref 0–37)
Alkaline Phosphatase: 100 U/L (ref 39–117)
BUN: 12 mg/dL (ref 6–23)
CHLORIDE: 98 meq/L (ref 96–112)
CO2: 25 mEq/L (ref 19–32)
CREATININE: 0.88 mg/dL (ref 0.40–1.50)
Calcium: 9.6 mg/dL (ref 8.4–10.5)
GFR: 112.44 mL/min (ref >60.00)
GLUCOSE: 189 mg/dL — AB (ref 70–99)
POTASSIUM: 4.2 meq/L (ref 3.5–5.1)
SODIUM: 136 meq/L (ref 135–145)
TOTAL PROTEIN: 7.5 g/dL (ref 6.0–8.3)
Total Bilirubin: 0.4 mg/dL (ref 0.2–1.2)

## 2018-04-02 ENCOUNTER — Ambulatory Visit: Payer: BLUE CROSS/BLUE SHIELD | Admitting: Endocrinology

## 2018-04-05 ENCOUNTER — Other Ambulatory Visit: Payer: Self-pay | Admitting: Endocrinology

## 2018-04-05 ENCOUNTER — Telehealth: Payer: Self-pay

## 2018-04-05 ENCOUNTER — Other Ambulatory Visit: Payer: Self-pay

## 2018-04-05 ENCOUNTER — Ambulatory Visit (INDEPENDENT_AMBULATORY_CARE_PROVIDER_SITE_OTHER): Payer: BLUE CROSS/BLUE SHIELD | Admitting: Endocrinology

## 2018-04-05 ENCOUNTER — Encounter: Payer: Self-pay | Admitting: Endocrinology

## 2018-04-05 ENCOUNTER — Telehealth: Payer: Self-pay | Admitting: Endocrinology

## 2018-04-05 VITALS — BP 160/80 | HR 60 | Ht 64.5 in | Wt 192.2 lb

## 2018-04-05 DIAGNOSIS — E7800 Pure hypercholesterolemia, unspecified: Secondary | ICD-10-CM

## 2018-04-05 DIAGNOSIS — I10 Essential (primary) hypertension: Secondary | ICD-10-CM | POA: Diagnosis not present

## 2018-04-05 DIAGNOSIS — E1165 Type 2 diabetes mellitus with hyperglycemia: Secondary | ICD-10-CM

## 2018-04-05 DIAGNOSIS — Z794 Long term (current) use of insulin: Secondary | ICD-10-CM

## 2018-04-05 MED ORDER — OLMESARTAN MEDOXOMIL 40 MG PO TABS: 40.0000 mg | ORAL_TABLET | Freq: Every day | ORAL | 3 refills | Status: DC

## 2018-04-05 NOTE — Telephone Encounter (Signed)
error 

## 2018-04-05 NOTE — Telephone Encounter (Signed)
Pt called and given MD message. PT verbalized understanding

## 2018-04-05 NOTE — Progress Notes (Signed)
Patient ID: Austin Norris, male   DOB: November 03, 1955, 62 y.o.   MRN: [id number]   Reason for Appointment: Diabetes follow-up   History of Present Illness   Diagnosis: Type 2 DIABETES MELITUS, long-standing     He has been on insulin to treat his diabetes for several years partly because of failure of oral agents and also for reducing cost of medications He is generally checking his blood sugars once or twice a day but does not keep a record Overall has had somewhat poor control with A1c consistently over 7%  Recent history:    Insulin regimen: Lantus  40units at bedtime, Novolog usually 35 at supper  Oral hypoglycemic drugs: Invokana 300mg , metformin ER 1 g daily  A1c is generally stable around 7 and about the same as last time at 7.2 although has been better previously   Current blood sugar patterns, management and problems identified:   He is checking his blood sugars fairly regularly but mostly in the mornings and evenings  He has significant variability in his overnight blood sugars and blood sugars at breakfast fluctuate significantly  However most of his readings during the night when he is checking them appear to be high  This is despite taking 35 units of NovoLog at suppertime  He may be needing snacks late at night  Does not do any formal exercise  Because of fear of hypoglycemia he does not cover his lunch with any NovoLog but usually eating a sandwich at that time  He is taking more insulin for his basal compared to the last time but also at that time he was around the time when he was doing his religious fast  No hypoglycemia documented with lowest reading 67 likely after dinner       Side effects from medications: None  Proper timing of medications in relation to meals: Yes.          Monitors blood glucose:  at least  Once a day.    Glucometer: Contour Blood Glucose readings from download   PRE-MEAL  6 AM-10 AM  midday  6 pm-12 AM   overnight Overall  Glucose range:  104-196    78-248   Mean/median:  147  118  166  162    POST-MEAL PC Breakfast PC Lunch PC Dinner  Glucose range:   193   Mean/median:       Previous readings:  PRE-MEAL Fasting Lunch Dinner Bedtime Overall  Glucose range:  61-1 86  151 1 48-188    Mean/median:  158        POST-MEAL PC Breakfast PC Lunch PC Dinner  Glucose range:    132-284  Mean/median:    193      Meals: 2-3  meals per day, usually some form of bread and milk in the morning, relatively smaller lunch, sometimes having eggs at lunchtime., sometimes sweets in pm, fried food occasionally        Dinner usually 5-6 PM   Physical activity: exercise: Mostly walking at work about 5 days a week, is active in the parking lot of his car [place] visit: Most recent: None             Wt Readings from Last 3 Encounters:  04/05/18 192 lb 3.2 oz (87.2 kg)  10/20/17 189 lb 14.4 oz (86.1 kg)  08/31/17 185 lb (83.9  kg)   LABS:  Lab Results  Component Value Date   HGBA1C 7.2 (H) 03/30/2018   HGBA1C 7.3 (H) 07/20/2017   HGBA1C 6.8 (H) 12/12/2016   Lab Results  Component Value Date   MICROALBUR 1.7 03/30/2018   LDLCALC 66 03/30/2018   CREATININE 0.88 03/31/2018    Other active problems: See review of systems   Allergies as of 04/05/2018   No Known Allergies     Medication List        Accurate as of 04/05/18 10:35 AM. Always use your most recent med list.          atorvastatin 40 MG tablet Commonly known as:  LIPITOR TAKE 1 TABLET BY MOUTH EVERY DAY   CONTOUR NEXT TEST test strip Generic drug:  glucose blood USE TO CHECK BLOOD SUGAR TWICE A DAY   DILT-XR 240 MG 24 hr capsule Generic drug:  diltiazem TAKE 1 CAPSULE (240 MG TOTAL) BY MOUTH DAILY. MUST KEEP UPCOMING APPT   hydrochlorothiazide 12.5 MG capsule Commonly known as:  MICROZIDE Take 1 capsule (12.5 mg total) by mouth daily. MUST KEEP UPCOMING APPT   insulin glargine 100 UNIT/ML  injection Commonly known as:  LANTUS INJECT 25 UNITS AT DINNER TIME   INSULIN SYRINGE 1CC/31GX5/16" 31G X 5/16" 1 ML Misc Use to inject lantus daily   INVOKANA 300 MG Tabs tablet Generic drug:  canagliflozin TAKE 1 TABLET (300 MG TOTAL) BY MOUTH DAILY BEFORE BREAKFAST.   KLOR-CON M10 10 MEQ tablet Generic drug:  potassium chloride TAKE 1 TABLET BY MOUTH EVERY DAY   metFORMIN 500 MG 24 hr tablet Commonly known as:  GLUCOPHAGE-XR TAKE 2 TABLETS DAILY WITH DINNER.   MICROLET LANCETS Misc Use to check blood sugar 2 times a day   NOVOLOG FLEXPEN 100 UNIT/ML FlexPen Generic drug:  insulin aspart INJECT 20 UNITS INTO THE SKIN 3 TIMES A DAY WITH MEALS   quinapril 40 MG tablet Commonly known as:  ACCUPRIL TAKE 1 TABLET EVERY DAY AT BEDTIME   tamsulosin 0.4 MG Caps capsule Commonly known as:  FLOMAX Take 0.4 mg by mouth at bedtime.       Allergies: No Known Allergies  Past Medical History:  Diagnosis Date  . Diabetes mellitus   . H/O epistaxis   . History of stomach ulcers    30 YRS AGO  . Hypercholesteremia   . Hypertension   . Shoulder pain, left   . Umbilical hernia     Past Surgical History:  Procedure Laterality Date  . CARDIAC CATHETERIZATION  1997  . COLONOSCOPY    . UMBILICAL HERNIA REPAIR N/A 07/11/2015   Procedure: LAPAROSCOPIC ASSISTED OPEN UMBILICAL HERNIA;  Surgeon: Austin Hausen, MD;  Location: [facility];  Service: General;  Laterality: N/A;  With MESH    Family History  Problem Relation Age of Onset  . Heart disease Mother   . Diabetes Father   . Diabetes Sister    No colon cancer  Social History:  reports that he quit smoking about 20 years ago. He has never used smokeless tobacco. He reports that he does not drink alcohol or use drugs.  Review of Systems:   Last eye exam 10/18  HYPERTENSION:  he has had long-standing hypertension Is  taking HCTZ, diltiazem and Accupril 40 mg Amlodipine was changed to diltiazem because of swelling of his  legs He thinks he is taking this regularly  Is also on Invokana  Blood pressure recently appears to be higher, he thinks he has  had high readings for the last 3 weeks of systolic about [phone] He is using a wrist cuff  BP Readings from Last 3 Encounters:  04/05/18 (!) 160/80  10/20/17 (!) 142/78  08/31/17 140/80     HYPERLIPIDEMIA: The lipid abnormality consists of elevated LDL.    He  is taking his Lipitor regularly at the 40 mg dose, previously on lower doses   Has had some evidence of CAD in the past but asymptomatic   LDL has been controlled more consistently recently  Lab Results  Component Value Date   CHOL 145 03/30/2018   HDL 51.60 03/30/2018   LDLCALC 66 03/30/2018   LDLDIRECT 89.8 12/29/2013   TRIG 138.0 03/30/2018   CHOLHDL 3 03/30/2018          Examination:   BP (!) 160/80 (BP Location: Left Arm, Patient Position: Sitting, Cuff Size: Normal)   Pulse 60   Ht 5' 4.5" (1.638 m)   Wt 192 lb 3.2 oz (87.2 kg)   SpO2 98%   BMI 32.48 kg/m   Body mass index is 32.48 kg/m.   No pedal edema  ASSESSMENT/ PLAN:   Diabetes type 2 insulin-dependent  See history of present illness for detailed discussion of his current management, blood sugar patterns and problems identified  His A1c is reasonably good at 7.2 although has been as low as 6.8 previously  Blood sugars have been mostly high in the early evenings partly because of his not taking any insulin for lunch when he is eating a sandwich Also he may be having snacks late at night causing his blood sugars to be higher overnight at times Also appears to have gained a little weight again As before he will adjust his insulin at mealtime and also Lantus empirically based on his diet and readings after his evening meal are not consistently high  RECOMMENDATIONS:  No change in basic insulin doses  However he needs to take at least 10 units of NovoLog at lunchtime to cover his sandwich despite 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  Also needs to check more readings after lunch  Encouraged him to cut back on portions and sweets to reduce his insulin dose  He will also need to start walking regularly  He needs follow-up more regularly   Complications: Has no microalbuminuria  HYPERTENSION: His blood pressure is not as well controlled with diltiazem compared to 10 mg amlodipine Does not have any edema with cyst which However because of his relatively low pulse rate will not increase the diltiazem and try him on Benicar instead of Accupril  given him phone numbers to contact PCP for general care  Aspirin prophylaxis: He can start this back since his epistaxis previously was related to a bleeding vessel that was   LIPIDS: Adequately controlled and will continue same dose of Lipitor  Counseling time on subjects discussed in assessment and plan sections is over 50% of today's 25 minute visit  There are no Patient Instructions on file for this visit.    Elayne Snare 04/05/2018, 10:35 AM   Note: This office note was prepared with Dragon voice recognition system technology. Any transcriptional errors that result from this process are unintentional.

## 2018-04-05 NOTE — Telephone Encounter (Signed)
Pt called and stated that he is not taking amlodipine, but he is taking Diltiazem 240mg .

## 2018-04-05 NOTE — Patient Instructions (Addendum)
Novolog 10 units at lunch   Check blood sugars on waking up 4 days a week  Also check blood sugars about 2 hours after meals and do this after different meals by rotation  Recommended blood sugar levels on waking up are 90-130 and about 2 hours after meal is 130-160  Please bring your blood sugar monitor to each visit, thank you  Check on amlodipine  Walking daily

## 2018-04-05 NOTE — Telephone Encounter (Signed)
We will need to change his quinapril to generic Benicar 40 mg daily and he will need to come back in about 6 weeks for follow-up blood pressure

## 2018-04-13 ENCOUNTER — Other Ambulatory Visit: Payer: Self-pay | Admitting: Endocrinology

## 2018-04-15 ENCOUNTER — Other Ambulatory Visit: Payer: Self-pay | Admitting: Endocrinology

## 2018-04-29 ENCOUNTER — Other Ambulatory Visit: Payer: Self-pay | Admitting: Endocrinology

## 2018-05-25 ENCOUNTER — Other Ambulatory Visit: Payer: Self-pay

## 2018-05-25 MED ORDER — INSULIN GLARGINE 100 UNIT/ML ~~LOC~~ SOLN: SUBCUTANEOUS | 1 refills | Status: DC

## 2018-05-27 ENCOUNTER — Other Ambulatory Visit: Payer: Self-pay | Admitting: Endocrinology

## 2018-06-05 ENCOUNTER — Other Ambulatory Visit: Payer: Self-pay | Admitting: Endocrinology

## 2018-06-06 ENCOUNTER — Other Ambulatory Visit: Payer: Self-pay | Admitting: Endocrinology

## 2018-06-30 ENCOUNTER — Other Ambulatory Visit: Payer: Self-pay | Admitting: Endocrinology

## 2018-07-05 ENCOUNTER — Other Ambulatory Visit (INDEPENDENT_AMBULATORY_CARE_PROVIDER_SITE_OTHER): Payer: BLUE CROSS/BLUE SHIELD

## 2018-07-05 ENCOUNTER — Other Ambulatory Visit: Payer: Self-pay | Admitting: Endocrinology

## 2018-07-05 DIAGNOSIS — Z794 Long term (current) use of insulin: Secondary | ICD-10-CM | POA: Diagnosis not present

## 2018-07-05 DIAGNOSIS — E1165 Type 2 diabetes mellitus with hyperglycemia: Secondary | ICD-10-CM

## 2018-07-05 DIAGNOSIS — E7800 Pure hypercholesterolemia, unspecified: Secondary | ICD-10-CM | POA: Diagnosis not present

## 2018-07-05 LAB — BASIC METABOLIC PANEL
BUN: 16 mg/dL (ref 6–23)
CO2: 28 mEq/L (ref 19–32)
Calcium: 9.6 mg/dL (ref 8.4–10.5)
Chloride: 103 mEq/L (ref 96–112)
Creatinine, Ser: 1.03 mg/dL (ref 0.40–1.50)
GFR: 88.15 mL/min (ref >60.00)
Glucose, Bld: 276 mg/dL — ABNORMAL HIGH (ref 70–99)
POTASSIUM: 4 meq/L (ref 3.5–5.1)
Sodium: 140 mEq/L (ref 135–145)

## 2018-07-05 LAB — LIPID PANEL
Cholesterol: 157 mg/dL (ref 0–200)
HDL: 59.2 mg/dL (ref >39.00)
LDL Cholesterol: 83 mg/dL (ref 0–99)
NONHDL: 98.01
Total CHOL/HDL Ratio: 3
Triglycerides: 75 mg/dL (ref 0.0–149.0)
VLDL: 15 mg/dL (ref 0.0–40.0)

## 2018-07-05 LAB — HEMOGLOBIN A1C: Hgb A1c MFr Bld: 7.2 % — ABNORMAL HIGH (ref 4.6–6.5)

## 2018-07-06 ENCOUNTER — Other Ambulatory Visit: Payer: Self-pay | Admitting: Endocrinology

## 2018-07-09 ENCOUNTER — Other Ambulatory Visit: Payer: Self-pay

## 2018-07-09 ENCOUNTER — Ambulatory Visit (INDEPENDENT_AMBULATORY_CARE_PROVIDER_SITE_OTHER): Payer: BLUE CROSS/BLUE SHIELD | Admitting: Endocrinology

## 2018-07-09 ENCOUNTER — Encounter: Payer: Self-pay | Admitting: Endocrinology

## 2018-07-09 VITALS — BP 142/68 | HR 74 | Ht 64.5 in | Wt 189.2 lb

## 2018-07-09 DIAGNOSIS — E1165 Type 2 diabetes mellitus with hyperglycemia: Secondary | ICD-10-CM | POA: Diagnosis not present

## 2018-07-09 DIAGNOSIS — E7800 Pure hypercholesterolemia, unspecified: Secondary | ICD-10-CM

## 2018-07-09 DIAGNOSIS — Z794 Long term (current) use of insulin: Secondary | ICD-10-CM

## 2018-07-09 DIAGNOSIS — I10 Essential (primary) hypertension: Secondary | ICD-10-CM | POA: Diagnosis not present

## 2018-07-09 NOTE — Progress Notes (Signed)
Patient ID: Austin Norris, male   DOB: 18-Aug-1955, 63 y.o.   MRN: [id number]   Reason for Appointment: Follow-up   History of Present Illness   Diagnosis: Type 2 DIABETES MELITUS, long-standing     He has been on insulin to treat his diabetes for several years partly because of failure of oral agents and also for reducing cost of medications He is generally checking his blood sugars once or twice a day but does not keep a record Overall has had somewhat poor control with A1c consistently over 7%  Recent history:    Insulin regimen: Lantus  40 units at bedtime, Novolog usually 20 at supper, NovoLog 0-10 at breakfast  Oral hypoglycemic drugs: Invokana 300mg , metformin ER 1 g daily  A1c is generally stable around 7 and about the same as last time at 7.2 although has been better previously   Current blood sugar patterns, management and problems identified:   He did not bring his monitor for download, he thinks he has no test strips  He reports relatively high readings after dinner but he does not think these are consistently  He will occasionally wake up at night with blood sugar in the 60s and not clear how often  Otherwise he thinks his morning readings are mostly near normal  He does not want to take NovoLog in the morning because he thinks he is active at work after breakfast which is about an hour before getting to work  He does not get any protein at breakfast and his glucose was 276 after breakfast in the lab  He does not think he is eating lunch and mostly a snack but may eat more on weekends insulin coverage also  He has not lost any weight recently       Side effects from medications: None  Proper timing of medications in relation to meals: Yes.          Monitors blood glucose:  at least  Once a day.    Glucometer: Contour Blood Glucose readings from recall:  Fasting usually 90-130  Pcs 120-190   PREVIOUS readings:  PRE-MEAL  6 AM-10 AM   midday  6 pm-12 AM  overnight Overall  Glucose range:  104-196    78-248   Mean/median:  147  118  166  162    POST-MEAL PC Breakfast PC Lunch PC Dinner  Glucose range:   193   Mean/median:         Meals: 2-3  meals per day, usually some form of bread and milk in the morning, relatively smaller lunch, sometimes having eggs at lunchtime., sometimes sweets in pm, fried food occasionally        Dinner usually 5-6 PM   Physical activity: exercise: Only with walking at work about 5 days a week, is active in the parking lot of his car Austin Norris visit: Most recent: None             Wt Readings from Last 3 Encounters:  07/09/18 189 lb 3.2 oz (85.8 kg)  04/05/18 192 lb 3.2 oz (87.2 kg)  10/20/17 189 lb 14.4 oz (86.1 kg)   LABS:  Lab Results  Component Value Date   HGBA1C 7.2 (H) 07/05/2018   HGBA1C 7.2 (H) 03/30/2018   HGBA1C 7.3 (H) 07/20/2017   Lab Results  Component Value Date   MICROALBUR  1.7 03/30/2018   LDLCALC 83 07/05/2018   CREATININE 1.03 07/05/2018    Other active problems: See review of systems   Allergies as of 07/09/2018   No Known Allergies     Medication List       Accurate as of July 09, 2018 11:59 PM. Always use your most recent med list.        atorvastatin 40 MG tablet Commonly known as:  LIPITOR TAKE 1 TABLET BY MOUTH EVERY DAY   Contour Next Test test strip Generic drug:  glucose blood USE TO CHECK BLOOD SUGAR TWICE A DAY   Dilt-XR 240 MG 24 hr capsule Generic drug:  diltiazem TAKE 1 CAPSULE BY MOUTH EVERY DAY *MUST KEEP UPCOMING APPOINTMENT*   hydrochlorothiazide 12.5 MG capsule Commonly known as:  MICROZIDE TAKE 1 CAPSULE EVERY DAY *MUST KEEP UPCOMING APPT*   insulin glargine 100 UNIT/ML injection Commonly known as:  Lantus INJECT 40 UNITS AT DINNER TIME   INSULIN SYRINGE 1CC/31GX5/16" 31G X 5/16" 1 ML Misc Use to inject lantus daily   Invokana 300 MG Tabs tablet Generic drug:  canagliflozin TAKE 1  TABLET BY MOUTH EVERY DAY BEFOR BREAKFAST.   Klor-Con M10 10 MEQ tablet Generic drug:  potassium chloride TAKE 1 TABLET BY MOUTH EVERY DAY   metFORMIN 500 MG 24 hr tablet Commonly known as:  GLUCOPHAGE-XR TAKE 2 TABLETS DAILY WITH DINNER.   Microlet Lancets Misc Use to check blood sugar 2 times a day   NovoLOG FlexPen 100 UNIT/ML FlexPen Generic drug:  insulin aspart INJECT 20 UNITS INTO THE SKIN 3 TIMES A DAY WITH MEALS   olmesartan 40 MG tablet Commonly known as:  BENICAR TAKE 1 TABLET EVERY DAY   quinapril 40 MG tablet Commonly known as:  ACCUPRIL TAKE 1 TABLET EVERY DAY AT BEDTIME   tamsulosin 0.4 MG Caps capsule Commonly known as:  FLOMAX Take 0.4 mg by mouth at bedtime.       Allergies: No Known Allergies  Past Medical History:  Diagnosis Date  . Diabetes mellitus   . H/O epistaxis   . History of stomach ulcers    30 YRS AGO  . Hypercholesteremia   . Hypertension   . Shoulder pain, left   . Umbilical hernia     Past Surgical History:  Procedure Laterality Date  . CARDIAC CATHETERIZATION  1997  . COLONOSCOPY    . UMBILICAL HERNIA REPAIR N/A 07/11/2015   Procedure: LAPAROSCOPIC ASSISTED OPEN UMBILICAL HERNIA;  Surgeon: Austin Hausen, MD;  Location: [facility];  Service: General;  Laterality: N/A;  With MESH    Family History  Problem Relation Age of Onset  . Heart disease Mother   . Diabetes Father   . Diabetes Sister    No colon cancer  Social History:  reports that he quit smoking about 21 years ago. He has never used smokeless tobacco. He reports that he does not drink alcohol or use drugs.  Review of Systems:   HYPERTENSION:  he has had long-standing hypertension Is  taking HCTZ, diltiazem and Benicar 40 mg Amlodipine was changed to diltiazem because of swelling of his legs Benicar was started in place of Accupril because of insurance noncoverage of Accupril  Is also on Invokana  Blood pressure at home reportedly is systolic about 502-774,  sometimes will use a wrist instrument   BP Readings from Last 3 Encounters:  07/09/18 (!) 142/68  04/05/18 (!) 160/80  10/20/17 (!) 142/78     HYPERLIPIDEMIA: The lipid abnormality  consists of elevated LDL.    He  is taking his Lipitor regularly at the 40 mg dose   Has had some evidence of CAD in the past but asymptomatic   LDL has been controlled well  Lab Results  Component Value Date   CHOL 157 07/05/2018   HDL 59.20 07/05/2018   LDLCALC 83 07/05/2018   LDLDIRECT 89.8 12/29/2013   TRIG 75.0 07/05/2018   CHOLHDL 3 07/05/2018   He has been followed by urologist for prostatism but still having some frequency of urination  He still has not established with a PCP for general care     Examination:   BP (!) 142/68 (BP Location: Left Arm, Patient Position: Sitting, Cuff Size: Normal)   Pulse 74   Ht 5' 4.5" (1.638 m)   Wt 189 lb 3.2 oz (85.8 kg)   SpO2 98%   BMI 31.97 kg/m   Body mass index is 31.97 kg/m.   Blood pressure was checked twice  ASSESSMENT/ PLAN:   Diabetes type 2 insulin-dependent  See history of present illness for detailed discussion of his current management, blood sugar patterns and problems identified  His A1c is reasonably good at 7.2 although has been as low as 6.8 previously  Blood sugars were not reviewed because he did not bring his meter Likely has relatively high readings after suffering from inadequate mealtime coverage Also he does not understand the need to cover his breakfast with some NovoLog and explained to him that his sugar was 276 after eating in the morning even though he says he took some NovoLog He also do not have protein in the morning at breakfast and discussed sources of protein to add Fasting readings are reportedly good with occasional nocturnal low sugars, not clear how often  RECOMMENDATIONS: Discussed needing to take relatively larger dose of NovoLog insulin probably 5 more units to cover breakfast and also add a  protein He can be guided by his blood sugar monitoring and he needs to start doing this at work around 9 AM We can reduce his Lantus to 42 to reduce tendency to occasional nocturnal hypoglycemia In the meantime unless he is eating low-carb meals he should take 20 to 25 units of NovoLog at suppertime More monitoring after lunch on weekends also Discussed blood sugar targets at various time He does need to start walking for exercise when he is not working for weight loss  HYPERTENSION: His blood pressure is fairly well controlled He may be getting falsely high readings at home For now he can continue his regimen and likely is getting somewhat better controlled with Benicar compared to Accupril  Again have given him phone numbers to contact PCP for general care    LIPIDS: Adequately controlled and will continue same dose of Lipitor    Patient Instructions  lantus 42   Novolog 20-25 at supper based on cab amounts   Counseling time on subjects discussed in assessment and plan sections is over 50% of today's 25 minute visit   Elayne Snare 07/11/2018, 7:07 PM   Note: This office note was prepared with Dragon voice recognition system technology. Any transcriptional errors that result from this process are unintentional.

## 2018-07-09 NOTE — Patient Instructions (Signed)
lantus 42   Novolog 20-25 at supper based on cab amounts

## 2018-08-07 ENCOUNTER — Other Ambulatory Visit: Payer: Self-pay | Admitting: Endocrinology

## 2018-08-12 ENCOUNTER — Other Ambulatory Visit: Payer: Self-pay | Admitting: Endocrinology

## 2018-08-24 ENCOUNTER — Telehealth: Payer: Self-pay | Admitting: Endocrinology

## 2018-08-24 NOTE — Telephone Encounter (Signed)
Patient is currently fasting (religion purposes) and requested to speak with Dr.Kumar in regards on what he needs to do with medications.   Please Advise, Thanks

## 2018-08-25 NOTE — Telephone Encounter (Signed)
He is taking 50 units of NovoLog at dinnertime and none with his pre-dawn meal but also has cut down Lantus to 25 red and fasting readings are 200 Advised him to take 35 of Lantus, 25 NovoLog at the evening meal and 15 with the predawn meal Also increase fluid intake whenever he is able to and drink plenty of water with his food Reassured him that he will not have any kidney problems with current management

## 2018-09-04 ENCOUNTER — Other Ambulatory Visit: Payer: Self-pay | Admitting: Endocrinology

## 2018-09-12 ENCOUNTER — Other Ambulatory Visit: Payer: Self-pay | Admitting: Endocrinology

## 2018-10-04 ENCOUNTER — Other Ambulatory Visit: Payer: Self-pay | Admitting: Endocrinology

## 2018-10-07 ENCOUNTER — Other Ambulatory Visit: Payer: Self-pay | Admitting: Endocrinology

## 2018-10-28 ENCOUNTER — Other Ambulatory Visit: Payer: Self-pay | Admitting: Endocrinology

## 2018-10-29 ENCOUNTER — Other Ambulatory Visit: Payer: Self-pay | Admitting: Endocrinology

## 2018-11-05 ENCOUNTER — Other Ambulatory Visit: Payer: Self-pay | Admitting: Endocrinology

## 2018-11-22 ENCOUNTER — Other Ambulatory Visit: Payer: Self-pay | Admitting: Endocrinology

## 2018-12-02 ENCOUNTER — Other Ambulatory Visit: Payer: Self-pay | Admitting: Endocrinology

## 2018-12-03 ENCOUNTER — Other Ambulatory Visit: Payer: Self-pay | Admitting: Endocrinology

## 2018-12-27 ENCOUNTER — Other Ambulatory Visit: Payer: Self-pay | Admitting: Endocrinology

## 2018-12-27 NOTE — Telephone Encounter (Signed)
Please send 15 tablets with note to make appointment

## 2018-12-27 NOTE — Telephone Encounter (Signed)
Has not been seen since march. Refill or deny?

## 2019-01-09 ENCOUNTER — Other Ambulatory Visit: Payer: Self-pay | Admitting: Endocrinology

## 2019-01-11 ENCOUNTER — Other Ambulatory Visit: Payer: Self-pay | Admitting: Endocrinology

## 2019-01-13 ENCOUNTER — Telehealth: Payer: Self-pay | Admitting: Endocrinology

## 2019-01-13 NOTE — Telephone Encounter (Signed)
LMTCB and schedule appointment

## 2019-01-13 NOTE — Telephone Encounter (Signed)
-----   Message from Elayne Snare, MD sent at 01/09/2019  8:49 PM EDT ----- Regarding: Appointment overdue Please schedule for labs and subsequent office visit for diabetes

## 2019-01-17 ENCOUNTER — Other Ambulatory Visit: Payer: Self-pay | Admitting: Endocrinology

## 2019-01-29 ENCOUNTER — Other Ambulatory Visit: Payer: Self-pay | Admitting: Endocrinology

## 2019-02-04 ENCOUNTER — Other Ambulatory Visit (INDEPENDENT_AMBULATORY_CARE_PROVIDER_SITE_OTHER): Payer: BLUE CROSS/BLUE SHIELD

## 2019-02-04 ENCOUNTER — Other Ambulatory Visit: Payer: Self-pay

## 2019-02-04 DIAGNOSIS — Z794 Long term (current) use of insulin: Secondary | ICD-10-CM

## 2019-02-04 DIAGNOSIS — E1165 Type 2 diabetes mellitus with hyperglycemia: Secondary | ICD-10-CM | POA: Diagnosis not present

## 2019-02-04 LAB — BASIC METABOLIC PANEL
BUN: 16 mg/dL (ref 6–23)
CO2: 30 mEq/L (ref 19–32)
Calcium: 9.9 mg/dL (ref 8.4–10.5)
Chloride: 101 mEq/L (ref 96–112)
Creatinine, Ser: 0.99 mg/dL (ref 0.40–1.50)
GFR: 92.1 mL/min (ref >60.00)
Glucose, Bld: 197 mg/dL — ABNORMAL HIGH (ref 70–99)
Potassium: 4.2 mEq/L (ref 3.5–5.1)
Sodium: 138 mEq/L (ref 135–145)

## 2019-02-04 LAB — HEMOGLOBIN A1C: Hgb A1c MFr Bld: 7.5 % — ABNORMAL HIGH (ref 4.6–6.5)

## 2019-02-05 ENCOUNTER — Other Ambulatory Visit: Payer: Self-pay | Admitting: Endocrinology

## 2019-02-06 NOTE — Progress Notes (Signed)
Patient ID: Austin Norris, male   DOB: 19-Jun-1955, 63 y.o.   MRN: [id number]   Reason for Appointment: Follow-up   History of Present Illness   Diagnosis: Type 2 DIABETES MELITUS, long-standing     He has been on insulin to treat his diabetes for several years partly because of failure of oral agents and also for reducing cost of medications He is generally checking his blood sugars once or twice a day but does not keep a record Overall has had somewhat poor control with A1c consistently over 7%  Recent history:    Insulin regimen: Lantus 45 units at bedtime, Novolog usually 20-25 at dinner only  Oral hypoglycemic drugs: Invokana 300mg , metformin ER 1 g daily  A1c is relatively higher at 7.5, was 7.2  Has been below 7 a couple of times previously   Current blood sugar patterns, management and problems identified:   He did not bring his monitor for download again  As before has been irregular with his follow-up  He claims that he does not get enough strips to monitor his blood sugars and keep running out but he has never documented monitoring for 5 times a day as he thinks he is  He will use [facility] testing supplies sometime  However he thinks that his contour monitor gives him inconsistent results at times  Lab glucose was 197 but does not check readings after breakfast, previously had been 276  Despite eating carbohydrates in the morning he does not take mealtime coverage for fear of hypoglycemia  He is generally going to work 1 to 2 hours after eating breakfast  He reports blood sugars reasonably good after dinner but likely has variability  No hypoglycemia reported  He does not think he is eating lunch at times except salads  He has lost any weight recently       Side effects from medications: None  Proper timing of medications in relation to meals: Yes.          Monitors blood glucose:  at least  Once a day.    Glucometer: Contour Blood Glucose  readings from recall:   PRE-MEAL Fasting Lunch Dinner Bedtime Overall  Glucose range: 85-130      Mean/median:        POST-MEAL PC Breakfast PC Lunch PC Dinner  Glucose range:   150-170  Mean/median:         Meals: 2-3  meals per day, usually some form of bread and milk in the morning, relatively smaller lunch, sometimes having eggs at lunchtime., sometimes sweets in pm, fried food occasionally        Dinner usually 5-6 PM   Physical activity: exercise: Only with walking at work up to 7 days a week, is active in the parking lot of his car [place] visit: Most recent: None             Wt Readings from Last 3 Encounters:  02/07/19 183 lb 3.2 oz (83.1 kg)  07/09/18 189 lb 3.2 oz (85.8 kg)  04/05/18 192 lb 3.2 oz (87.2 kg)   LABS:  Lab Results  Component Value Date   HGBA1C 7.5 (H) 02/04/2019   HGBA1C 7.2 (H) 07/05/2018   HGBA1C 7.2 (H) 03/30/2018   Lab Results  Component Value Date   MICROALBUR 1.7 03/30/2018   LDLCALC 83 07/05/2018   CREATININE 0.99  02/04/2019    Other active problems: See review of systems   Allergies as of 02/07/2019   No Known Allergies     Medication List       Accurate as of February 07, 2019  3:40 PM. If you have any questions, ask your nurse or doctor.        atorvastatin 40 MG tablet Commonly known as: LIPITOR TAKE 1 TABLET BY MOUTH EVERY DAY   Contour Next Test test strip Generic drug: glucose blood USE TO CHECK BLOOD SUGAR TWICE A DAY   Contour Next Test test strip Generic drug: glucose blood USE TO CHECK BLOOD SUGAR TWICE DAILY   Dilt-XR 240 MG 24 hr capsule Generic drug: diltiazem TAKE 1 CAPSULE BY MOUTH EVERY DAY   FreeStyle Libre 14 Day Reader Devi 1 Device by Does not apply route once for 1 dose. Started by: Elayne Snare, MD   FreeStyle Libre 14 Day Sensor Misc 1 Units by Does not apply route every 14 (fourteen) days. Started by: Elayne Snare, MD   hydrochlorothiazide 12.5 MG  capsule Commonly known as: MICROZIDE TAKE 1 CAPSULE BY MOUTH EVERY DAY   insulin glargine 100 UNIT/ML injection Commonly known as: Lantus INJECT 35 UNITS AT DINNER TIME What changed: additional instructions   INSULIN SYRINGE 1CC/31GX5/16" 31G X 5/16" 1 ML Misc Use to inject lantus daily   Invokana 300 MG Tabs tablet Generic drug: canagliflozin TAKE 1 TABLET BY MOUTH EVERY DAY BEFOR BREAKFAST.   Klor-Con M10 10 MEQ tablet Generic drug: potassium chloride TAKE 1 TABLET BY MOUTH EVERY DAY   metFORMIN 500 MG 24 hr tablet Commonly known as: GLUCOPHAGE-XR TAKE 2 TABLETS BY MOUTH EVERY DAY WITH DINNER.   Microlet Lancets Misc Use to check blood sugar 2 times a day   NovoLOG FlexPen 100 UNIT/ML FlexPen Generic drug: insulin aspart INJECT 20 UNITS INTO THE SKIN 3 TIMES A DAY WITH MEALS   olmesartan 40 MG tablet Commonly known as: BENICAR 1 tablet daily What changed: See the new instructions. Changed by: Elayne Snare, MD   tamsulosin 0.4 MG Caps capsule Commonly known as: FLOMAX Take 0.4 mg by mouth at bedtime.       Allergies: No Known Allergies  Past Medical History:  Diagnosis Date  . Diabetes mellitus   . H/O epistaxis   . History of stomach ulcers    30 YRS AGO  . Hypercholesteremia   . Hypertension   . Shoulder pain, left   . Umbilical hernia     Past Surgical History:  Procedure Laterality Date  . CARDIAC CATHETERIZATION  1997  . COLONOSCOPY    . UMBILICAL HERNIA REPAIR N/A 07/11/2015   Procedure: LAPAROSCOPIC ASSISTED OPEN UMBILICAL HERNIA;  Surgeon: Johnathan Hausen, MD;  Location: [facility];  Service: General;  Laterality: N/A;  With MESH    Family History  Problem Relation Age of Onset  . Heart disease Mother   . Diabetes Father   . Diabetes Sister    No colon cancer  Social History:  reports that he quit smoking about 21 years ago. He has never used smokeless tobacco. He reports that he does not drink alcohol or use drugs.  Review of  Systems:   HYPERTENSION:  he has had long-standing hypertension Is  taking HCTZ, diltiazem and Benicar 40 mg  Amlodipine was changed to diltiazem because of swelling of his legs  Is also on Invokana  Blood pressure at home not checked recently Currently appears to be out of his Benicar  BP Readings from Last 3 Encounters:  02/07/19 (!) 170/80  07/09/18 (!) 142/68  04/05/18 (!) 160/80     HYPERLIPIDEMIA: The lipid abnormality consists of elevated LDL.    He  is taking his Lipitor regularly at the 40 mg dose   Has had some evidence of CAD in the past but asymptomatic   LDL has been controlled well  Lab Results  Component Value Date   CHOL 157 07/05/2018   HDL 59.20 07/05/2018   LDLCALC 83 07/05/2018   LDLDIRECT 89.8 12/29/2013   TRIG 75.0 07/05/2018   CHOLHDL 3 07/05/2018   He has been followed by urologist for prostatism  Occasionally feels sharp pains in the bottom of his foot but no numbness  He still has not established with a PCP for general care     Examination:   BP (!) 170/80 (BP Location: Left Arm, Patient Position: Sitting, Cuff Size: Normal)   Pulse 68   Ht 5' 4.5" (1.638 m)   Wt 183 lb 3.2 oz (83.1 kg)   SpO2 96%   BMI 30.96 kg/m   Body mass index is 30.96 kg/m.   Diabetic Foot Exam - Simple   Simple Foot Form Diabetic Foot exam was performed with the following findings: Yes   Visual Inspection No deformities, no ulcerations, no other skin breakdown bilaterally: Yes Sensation Testing Intact to touch and monofilament testing bilaterally: Yes Pulse Check Posterior Tibialis and Dorsalis pulse intact bilaterally: Yes Comments      ASSESSMENT/ PLAN:   Diabetes type 2 insulin-dependent  See history of present illness for detailed discussion of his current management, blood sugar patterns and problems identified  His A1c is relatively higher at 7.5, has been as low as 6.8 previously  Blood blood sugar patterns are not available, as  before he did not bring his meter He adjusts his NovoLog and Lantus on his own Discussed his A1c being high related to likely postprandial hyperglycemia Also not clear if his Lantus is active 24 hours   RECOMMENDATIONS: Discussed glucose monitoring He again was told to check his blood sugars regularly and bring a monitor for download at next visit However he may benefit from using the freestyle libre sensor Discussed in detail how this is used and to try and get this if covered by his insurance He can compare is fingersticks to the sensor May consider Tresiba or Toujeo if blood sugar control is not consistent over 24 hours  Since he usually has high readings at breakfast he will need to take at least 5 units of NovoLog in the morning We may be able to adjust his suppertime dose is better if checking readings consistently with the freestyle libre  Since he is having some nonspecific GI symptoms he can leave off the Metformin for at least a week  HYPERTENSION: His blood pressure is not well controlled Still not monitoring at home and previously had used a wrist cuff Does not appear to be taking Benicar although he is unclear about this, has not had a prescription for at least 2 weeks for this  He needs a PCP   LIPIDS: Previously controlled and will continue same dose of Lipitor    Patient Instructions  Check blood sugars on waking up 3 days a week  Also check blood sugars about 2 hours after meals and do this after different meals by rotation  Recommended blood sugar levels on waking up are 90-130 and about 2 hours after meal is 130-160  Please  bring your blood sugar monitor to each visit, thank you  Take 5 Novolog at [place] and blood sugar after breakfast should not be over 160 Have some kind of protein at breakfast daily  Check some blood sugars before and after dinner  If you are having difficulty using the freestyle libre sensor please let us know. If it does  not stick well you can get an armband from [place] or other source to keep it on     Counseling time on subjects discussed in assessment and plan sections is over 50% of today's 25 minute visit   Elayne Snare 02/07/2019, 3:40 PM   Note: This office note was prepared with Dragon voice recognition system technology. Any transcriptional errors that result from this process are unintentional.

## 2019-02-07 ENCOUNTER — Encounter: Payer: Self-pay | Admitting: Endocrinology

## 2019-02-07 ENCOUNTER — Ambulatory Visit: Payer: BLUE CROSS/BLUE SHIELD | Admitting: Endocrinology

## 2019-02-07 ENCOUNTER — Other Ambulatory Visit: Payer: Self-pay

## 2019-02-07 VITALS — BP 170/80 | HR 68 | Ht 64.5 in | Wt 183.2 lb

## 2019-02-07 DIAGNOSIS — E7800 Pure hypercholesterolemia, unspecified: Secondary | ICD-10-CM | POA: Diagnosis not present

## 2019-02-07 DIAGNOSIS — Z794 Long term (current) use of insulin: Secondary | ICD-10-CM | POA: Diagnosis not present

## 2019-02-07 DIAGNOSIS — I10 Essential (primary) hypertension: Secondary | ICD-10-CM | POA: Diagnosis not present

## 2019-02-07 DIAGNOSIS — E1165 Type 2 diabetes mellitus with hyperglycemia: Secondary | ICD-10-CM | POA: Diagnosis not present

## 2019-02-07 MED ORDER — OLMESARTAN MEDOXOMIL 40 MG PO TABS: ORAL_TABLET | ORAL | 3 refills | Status: DC

## 2019-02-07 MED ORDER — FREESTYLE LIBRE 14 DAY SENSOR MISC: 1.0000 [IU] | 4 refills | Status: DC

## 2019-02-07 MED ORDER — FREESTYLE LIBRE 14 DAY READER DEVI: 1.0000 | Freq: Once | 0 refills | Status: AC

## 2019-02-07 NOTE — Patient Instructions (Addendum)
Check blood sugars on waking up 3 days a week  Also check blood sugars about 2 hours after meals and do this after different meals by rotation  Recommended blood sugar levels on waking up are 90-130 and about 2 hours after meal is 130-160  Please bring your blood sugar monitor to each visit, thank you  Take 5 Novolog at [place] and blood sugar after breakfast should not be over 160 Have some kind of protein at breakfast daily  Check some blood sugars before and after dinner  If you are having difficulty using the freestyle libre sensor please let us know. If it does not stick well you can get an armband from [place] or other source to keep it on

## 2019-02-08 ENCOUNTER — Other Ambulatory Visit: Payer: Self-pay | Admitting: Endocrinology

## 2019-02-08 ENCOUNTER — Telehealth: Payer: Self-pay

## 2019-02-08 NOTE — Telephone Encounter (Signed)
error 

## 2019-02-09 ENCOUNTER — Other Ambulatory Visit: Payer: Self-pay | Admitting: Endocrinology

## 2019-02-09 ENCOUNTER — Other Ambulatory Visit: Payer: Self-pay

## 2019-02-09 ENCOUNTER — Telehealth: Payer: Self-pay

## 2019-02-09 NOTE — Telephone Encounter (Signed)
PA initiated via [email or web address] for the [facility] 14 day sensor and reader.   Key: [id number] help? Call us at [phone] Status Sent to [employer] FreeStyle Libre 14 Day Sensor Form Blue [employer] Electronic Request Form (CB)  Your information has been submitted to [employer] [place]. Blue Cross [place] will review the request and fax you a determination directly, typically within 3 business days of your submission once all necessary information is received.  If [employer] [place] has not responded in 3 business days or if you have any questions about your submission, contact [place] at [phone].

## 2019-02-09 NOTE — Telephone Encounter (Signed)
error 

## 2019-02-09 NOTE — Telephone Encounter (Signed)
We can try doing PA

## 2019-02-09 NOTE — Telephone Encounter (Signed)
PA completed for freestyle libre sensor and reader.

## 2019-02-09 NOTE — Telephone Encounter (Signed)
Do you want PA done for [facility]?

## 2019-02-09 NOTE — Telephone Encounter (Signed)
That will be too complicated for him

## 2019-02-09 NOTE — Telephone Encounter (Signed)
PA for [employer] reader  [place]: [id number] help? Call us at [phone] Status Sent to [place] 14 Day Reader XX DEVI Form Blue Cross [place] [employer] Electronic Request Form (CB)   Your information has been submitted to [employer] [place]. Blue Cross [place] will review the request and fax you a determination directly, typically within 3 business days of your submission once all necessary information is received.  If [employer] [place] has not responded in 3 business days or if you have any questions about your submission, contact [place] at [phone].

## 2019-02-09 NOTE — Telephone Encounter (Signed)
Okay to change to [facility] for insurance preference? PA can be attempted for [facility], but will be denied because Dexcom has not been tried & failed.

## 2019-02-10 NOTE — Telephone Encounter (Signed)
PA for [facility] sensors and reader have been approved from 02/09/2019-02/08/2020.

## 2019-02-14 ENCOUNTER — Telehealth: Payer: Self-pay | Admitting: Endocrinology

## 2019-02-14 NOTE — Telephone Encounter (Signed)
Patient requests to be called at ph# [phone] re: medication change questions

## 2019-02-24 ENCOUNTER — Other Ambulatory Visit: Payer: Self-pay | Admitting: Endocrinology

## 2019-02-26 ENCOUNTER — Other Ambulatory Visit: Payer: Self-pay | Admitting: Endocrinology

## 2019-03-01 ENCOUNTER — Other Ambulatory Visit: Payer: Self-pay | Admitting: Endocrinology

## 2019-03-03 ENCOUNTER — Telehealth: Payer: Self-pay

## 2019-03-03 NOTE — Telephone Encounter (Signed)
Patient called in stating medication is not working for him olmesartan (BENICAR) 40 MG tablet   Please call and advise

## 2019-03-03 NOTE — Telephone Encounter (Signed)
Is he asking about high blood pressure?  What are the readings?  Is he taking the diltiazem and HCTZ also? If he is taking the other 2 medications and 40 mg of olmesartan will need to come into the office and bring his meter also

## 2019-03-04 ENCOUNTER — Emergency Department (HOSPITAL_COMMUNITY)
Admission: EM | Admit: 2019-03-04 | Discharge: 2019-03-05 | Disposition: A | Discharge status: Home / Self Care | Payer: BLUE CROSS/BLUE SHIELD | Attending: Emergency Medicine | Admitting: Emergency Medicine

## 2019-03-04 ENCOUNTER — Emergency Department (HOSPITAL_COMMUNITY): Payer: BLUE CROSS/BLUE SHIELD

## 2019-03-04 ENCOUNTER — Other Ambulatory Visit: Payer: Self-pay | Admitting: Endocrinology

## 2019-03-04 ENCOUNTER — Encounter (HOSPITAL_COMMUNITY): Payer: Self-pay | Admitting: Emergency Medicine

## 2019-03-04 ENCOUNTER — Other Ambulatory Visit: Payer: Self-pay

## 2019-03-04 DIAGNOSIS — Z87891 Personal history of nicotine dependence: Secondary | ICD-10-CM | POA: Insufficient documentation

## 2019-03-04 DIAGNOSIS — I10 Essential (primary) hypertension: Secondary | ICD-10-CM | POA: Diagnosis not present

## 2019-03-04 DIAGNOSIS — Z794 Long term (current) use of insulin: Secondary | ICD-10-CM | POA: Diagnosis not present

## 2019-03-04 DIAGNOSIS — R140 Abdominal distension (gaseous): Secondary | ICD-10-CM

## 2019-03-04 DIAGNOSIS — R200 Anesthesia of skin: Secondary | ICD-10-CM

## 2019-03-04 DIAGNOSIS — E119 Type 2 diabetes mellitus without complications: Secondary | ICD-10-CM | POA: Diagnosis not present

## 2019-03-04 DIAGNOSIS — Z79899 Other long term (current) drug therapy: Secondary | ICD-10-CM | POA: Diagnosis not present

## 2019-03-04 LAB — CBC
HCT: 48.2 % (ref 39.0–52.0)
Hemoglobin: 15.5 g/dL (ref 13.0–17.0)
MCH: 29.4 pg (ref 26.0–34.0)
MCHC: 32.2 g/dL (ref 30.0–36.0)
MCV: 91.3 fL (ref 80.0–100.0)
Platelets: 237 10*3/uL (ref 150–400)
RBC: 5.28 MIL/uL (ref 4.22–5.81)
RDW: 13.4 % (ref 11.5–15.5)
WBC: 5.8 10*3/uL (ref 4.0–10.5)
nRBC: 0 % (ref 0.0–0.2)

## 2019-03-04 LAB — LIPASE, BLOOD: Lipase: 48 U/L (ref 11–51)

## 2019-03-04 LAB — COMPREHENSIVE METABOLIC PANEL
ALT: 16 U/L (ref 0–44)
AST: 22 U/L (ref 15–41)
Albumin: 4.1 g/dL (ref 3.5–5.0)
Alkaline Phosphatase: 77 U/L (ref 38–126)
Anion gap: 11 (ref 5–15)
BUN: 17 mg/dL (ref 8–23)
CO2: 24 mmol/L (ref 22–32)
Calcium: 9.2 mg/dL (ref 8.9–10.3)
Chloride: 104 mmol/L (ref 98–111)
Creatinine, Ser: 0.9 mg/dL (ref 0.61–1.24)
GFR calc Af Amer: >60 mL/min (ref >60)
GFR calc non Af Amer: >60 mL/min (ref >60)
Glucose, Bld: 134 mg/dL — ABNORMAL HIGH (ref 70–99)
Potassium: 3.7 mmol/L (ref 3.5–5.1)
Sodium: 139 mmol/L (ref 135–145)
Total Bilirubin: 0.4 mg/dL (ref 0.3–1.2)
Total Protein: 6.9 g/dL (ref 6.5–8.1)

## 2019-03-04 LAB — CBG MONITORING, ED: Glucose-Capillary: 129 mg/dL — ABNORMAL HIGH (ref 70–99)

## 2019-03-04 MED ORDER — ALUM & MAG HYDROXIDE-SIMETH 200-200-20 MG/5ML PO SUSP
30.0000 mL | Freq: Once | ORAL | Status: AC
  Administered 2019-03-04: 30 mL via ORAL
  Filled 2019-03-04: qty 30

## 2019-03-04 NOTE — Telephone Encounter (Signed)
Called pt and gave him MD message. Pt stated that he was taking both Diltiazem and HCTZ along with the Olmesartan.  Pt was scheduled for acute f/u visit Monday at 1pm and he was advised to bring every one of his medications and his home blood pressure monitor to the visit. Pt verbalized understanding.

## 2019-03-04 NOTE — ED Triage Notes (Signed)
Patient had some [employer] around 7:45 pm, stated after that he got bloated and gassy and his lower lip started to go numb. Right now the numbness to his lip is getting better. He said sometimes he gets some intermittent numbness to his L arm and leg but denies any tonight, steady gait noted.

## 2019-03-04 NOTE — ED Provider Notes (Signed)
TIME SEEN: 11:08 PM  CHIEF COMPLAINT: Abdominal bloating, left-sided numbness  HPI: Patient is a 63 year old male with history of hypertension, hyperlipidemia, diabetes who presents to the emergency department with 2 separate complaints.  He states that after eating [facility] tonight he felt like his abdomen became distended and he felt bloated.  No fevers, nausea, vomiting, diarrhea, dysuria or hematuria.  Did not take medications prior to arrival.  He does state that before eating [employer] he felt like his blood sugar was dropping and this improved after eating but he developed left lower lip and face numbness that has now resolved.  He states intermittently he will have left arm and leg numbness but it does not involve the entire arm or leg just parts of it.  He sometimes will have headaches with this.  No numbness or focal weakness currently.  He has never been worked up for these episodes.  No previous history of stroke.  ROS: See HPI Constitutional: no fever  Eyes: no drainage  ENT: no runny nose   Cardiovascular:  no chest pain  Resp: no SOB  GI: no vomiting GU: no dysuria Integumentary: no rash  Allergy: no hives  Musculoskeletal: no leg swelling  Neurological: no slurred speech ROS otherwise negative  PAST MEDICAL HISTORY/PAST SURGICAL HISTORY:  Past Medical History:  Diagnosis Date  . Diabetes mellitus   . H/O epistaxis   . History of stomach ulcers    30 YRS AGO  . Hypercholesteremia   . Hypertension   . Shoulder pain, left   . Umbilical hernia     MEDICATIONS:  Prior to Admission medications   Medication Sig Start Date End Date Taking? Authorizing Provider  atorvastatin (LIPITOR) 40 MG tablet TAKE 1 TABLET BY MOUTH EVERY DAY 01/30/19   Elayne Snare, MD  Continuous Blood Gluc Sensor (FREESTYLE LIBRE 14 DAY SENSOR) MISC 1 Units by Does not apply route every 14 (fourteen) days. 02/07/19   Elayne Snare, MD  CONTOUR NEXT TEST test strip USE TO CHECK BLOOD SUGAR TWICE A DAY 08/12/18    Elayne Snare, MD  CONTOUR NEXT TEST test strip USE TO CHECK BLOOD SUGAR TWICE DAILY 08/12/18   Elayne Snare, MD  DILT-XR 240 MG 24 hr capsule TAKE 1 CAPSULE BY MOUTH EVERY DAY 03/01/19   Elayne Snare, MD  hydrochlorothiazide (MICROZIDE) 12.5 MG capsule TAKE 1 CAPSULE BY MOUTH EVERY DAY 08/12/18   Elayne Snare, MD  insulin glargine (LANTUS) 100 UNIT/ML injection INJECT 35 UNITS AT [facility] TIME 02/24/19   Elayne Snare, MD  Insulin Syringe-Needle U-100 (INSULIN SYRINGE 1CC/31GX5/16") 31G X 5/16" 1 ML MISC Use to inject lantus daily 01/16/16   Elayne Snare, MD  INVOKANA 300 MG TABS tablet TAKE 1 TABLET BY MOUTH EVERY DAY BEFOR BREAKFAST. 03/04/19   Elayne Snare, MD  KLOR-CON M10 10 MEQ tablet TAKE 1 TABLET BY MOUTH EVERY DAY 02/09/19   Elayne Snare, MD  metFORMIN (GLUCOPHAGE-XR) 500 MG 24 hr tablet TAKE 2 TABLETS BY MOUTH EVERY DAY WITH DINNER. 02/27/19   Elayne Snare, MD  MICROLET LANCETS MISC Use to check blood sugar 2 times a day 01/16/16   Elayne Snare, MD  NOVOLOG FLEXPEN 100 UNIT/ML FlexPen INJECT 20 UNITS INTO THE SKIN 3 TIMES A DAY WITH MEALS 02/08/19   Elayne Snare, MD  olmesartan (BENICAR) 40 MG tablet 1 tablet daily 02/07/19   Elayne Snare, MD  tamsulosin (FLOMAX) 0.4 MG CAPS capsule Take 0.4 mg by mouth at bedtime. 07/14/14   [provider]  ALLERGIES:  No Known Allergies  SOCIAL HISTORY:  Social History   Tobacco Use  . Smoking status: Former Smoker    Quit date: 05/21/1997    Years since quitting: 21.8  . Smokeless tobacco: Never Used  Substance Use Topics  . Alcohol use: No    FAMILY HISTORY: Family History  Problem Relation Age of Onset  . Heart disease Mother   . Diabetes Father   . Diabetes Sister     EXAM: BP (!) 188/79 (BP Location: Right Arm)   Pulse 68   Temp 98.4 F (36.9 C) (Oral)   Resp 16   Ht 5\' 3"  (1.6 m)   Wt 84.4 kg   SpO2 98%   BMI 32.95 kg/m  CONSTITUTIONAL: Alert and oriented and responds appropriately to questions. Well-appearing;  well-nourished HEAD: Normocephalic EYES: Conjunctivae clear, pupils appear equal, EOMI ENT: normal nose; moist mucous membranes, no angioedema, normal speech, no stridor, no trismus or drooling NECK: Supple, no meningismus, no nuchal rigidity, no LAD  CARD: RRR; S1 and S2 appreciated; no murmurs, no clicks, no rubs, no gallops RESP: Normal chest excursion without splinting or tachypnea; breath sounds clear and equal bilaterally; no wheezes, no rhonchi, no rales, no hypoxia or respiratory distress, speaking full sentences ABD/GI: Normal bowel sounds; non-distended; soft, non-tender, no rebound, no guarding, no peritoneal signs, no hepatosplenomegaly BACK:  The back appears normal and is non-tender to palpation, there is no CVA tenderness EXT: Normal ROM in all joints; non-tender to palpation; no edema; normal capillary refill; no cyanosis, no calf tenderness or swelling    SKIN: Normal color for age and race; warm; no rash NEURO: Moves all extremities equally, sensation to light touch intact diffusely, cranial nerves II through XII intact, normal speech PSYCH: The patient's mood and manner are appropriate. Grooming and personal hygiene are appropriate.  MEDICAL DECISION MAKING: Patient here with feeling bloated after eating [employer].  Abdominal exam and abdominal labs obtained in triage are unremarkable.  Doubt cholecystitis, pancreatitis, appendicitis, colitis, diverticulitis, bowel obstruction, perforation.  I do not feel he needs emergent imaging of his abdomen.  Will give dose of Mylanta for symptomatic relief.  Patient was also very concerned that these intermittent episodes of left-sided numbness.  He has no focal neurologic deficits currently.  Seems atypical for stroke given symptoms only occur in parts of the left face, left arm and left leg.  He does have multiple risk factors however for CVA.  Will obtain CT of the head.  If this is unremarkable and patient remains neurologically intact, I feel  patient would be safe for discharge home with outpatient work-up with neurology.  He is also comfortable with this plan.  ED PROGRESS: Patient reports abdominal discomfort has completely resolved and he has been able to get some rest.  CT of the head shows chronic microvascular changes but no acute abnormality.  I have recommended close follow-up with neurology as an outpatient.  Given he has had these intermittent left-sided numbness symptoms for years I do not feel he needs emergent work-up.  Recommended starting a baby aspirin as he does have multiple risk factors for stroke, TIA.  He is comfortable with this plan.  Will discharge home.  At this time, I do not feel there is any life-threatening condition present. I have reviewed, interpreted and discussed all results (EKG, imaging, lab, urine as appropriate) and exam findings with patient/family. I have reviewed nursing notes and appropriate previous records.  I feel the patient is safe to  be discharged home without further emergent workup and can continue workup as an outpatient as needed. Discussed usual and customary return precautions. Patient/family verbalize understanding and are comfortable with this plan.  Outpatient follow-up has been provided as needed. All questions have been answered.     EKG Interpretation  Date/Time:  Friday March 04 2019 23:34:00 EST Ventricular Rate:  54 PR Interval:    QRS Duration: 92 QT Interval:  445 QTC Calculation: 422 R Axis:   48 Text Interpretation: Sinus rhythm Nonspecific T abnormalities, inferior leads that appear new compared to previous Confirmed by Pryor Curia [phone]) on 03/04/2019 11:36:56 PM        Austin Norris was evaluated in Emergency Department on 03/04/2019 for the symptoms described in the history of present illness. He was evaluated in the context of the global COVID-19 pandemic, which necessitated consideration that the patient might be at risk for infection with the SARS-CoV-2  virus that causes COVID-19. Institutional protocols and algorithms that pertain to the evaluation of patients at risk for COVID-19 are in a state of rapid change based on information released by regulatory bodies including the CDC and federal and state organizations. These policies and algorithms were followed during the patient's care in the ED.    Austin Norris, Delice Bison, DO 03/05/19 [phone]

## 2019-03-05 LAB — URINALYSIS, ROUTINE W REFLEX MICROSCOPIC
Bacteria, UA: NONE SEEN
Bilirubin Urine: NEGATIVE
Glucose, UA: >=500 mg/dL — AB
Hgb urine dipstick: NEGATIVE
Ketones, ur: NEGATIVE mg/dL
Leukocytes,Ua: NEGATIVE
Nitrite: NEGATIVE
Protein, ur: NEGATIVE mg/dL
Specific Gravity, Urine: 1.037 — ABNORMAL HIGH (ref 1.005–1.030)
pH: 5 (ref 5.0–8.0)

## 2019-03-05 MED ORDER — ASPIRIN 81 MG PO CHEW: 81.0000 mg | CHEWABLE_TABLET | Freq: Every day | ORAL | 1 refills | Status: AC

## 2019-03-05 NOTE — Discharge Instructions (Signed)
Your labs, urine today were reassuring.  I feel your abdominal bloating is likely from eating [place] fried chicken tonight.  You may take Mylanta or Maalox over-the-counter to help with the symptoms.  I recommend avoiding spicy, greasy, acidic, fatty meals to avoid the symptoms.  As for your intermittent left-sided numbness, your head CT showed no acute abnormality but I recommend close follow-up with outpatient neurologist for further evaluation of these ongoing symptoms.  I recommend that she begin taking 81 mg of aspirin daily.  If you develop symptoms of numbness or weakness, vision or speech changes, chest pain or shortness of breath, please return to the emergency department immediately.

## 2019-03-07 ENCOUNTER — Encounter: Payer: Self-pay | Admitting: Endocrinology

## 2019-03-07 ENCOUNTER — Ambulatory Visit (INDEPENDENT_AMBULATORY_CARE_PROVIDER_SITE_OTHER): Payer: BLUE CROSS/BLUE SHIELD | Admitting: Endocrinology

## 2019-03-07 ENCOUNTER — Other Ambulatory Visit: Payer: Self-pay

## 2019-03-07 VITALS — BP 146/72 | HR 61 | Ht 63.0 in | Wt 183.4 lb

## 2019-03-07 DIAGNOSIS — I10 Essential (primary) hypertension: Secondary | ICD-10-CM | POA: Diagnosis not present

## 2019-03-07 MED ORDER — SPIRONOLACTONE-HCTZ 25-25 MG PO TABS: 1.0000 | ORAL_TABLET | Freq: Every day | ORAL | 1 refills | Status: DC

## 2019-03-07 NOTE — Progress Notes (Signed)
Patient ID: Austin Norris, male   DOB: May 04, 1955, 63 y.o.   MRN: [id number]           Chief complaint: High blood pressure  History of Present Illness:   He has had significantly high blood pressure more recently Although on his last visit in October he appeared not to be taking his Benicar for his hypertension with starting this he does not have any improvement in his blood pressure Today he brought his home blood pressure monitor for comparison and this was reading relatively higher about 10 mm on the systolic  He thinks his blood pressure has been running about [id number] systolic consistently since last month Yesterday on his own he took an extra tablet of HCTZ and he thinks his blood pressure was better last night  He also takes potassium tablets with his HCTZ for history of hypokalemia Has had normal renal function  BP Readings from Last 3 Encounters:  03/07/19 (!) 146/72  03/05/19 (!) 172/75  02/07/19 (!) 170/80     Wt Readings from Last 3 Encounters:  03/07/19 183 lb 6.4 oz (83.2 kg)  03/04/19 186 lb (84.4 kg)  02/07/19 183 lb 3.2 oz (83.1 kg)    Lab Results  Component Value Date   CREATININE 0.90 03/04/2019   CREATININE 0.99 02/04/2019   CREATININE 1.03 07/05/2018   Problem 2:  He is concerned about the high dose of atorvastatin but has not been needing 40 mg to keep his LDL below 100  Lab Results  Component Value Date   CHOL 157 07/05/2018   HDL 59.20 07/05/2018   LDLCALC 83 07/05/2018   LDLDIRECT 89.8 12/29/2013   TRIG 75.0 07/05/2018   CHOLHDL 3 07/05/2018     Allergies as of 03/07/2019   No Known Allergies     Medication List       Accurate as of March 07, 2019  1:27 PM. If you have any questions, ask your nurse or doctor.        aspirin 81 MG chewable tablet Chew 1 tablet (81 mg total) by mouth daily.   atorvastatin 40 MG tablet Commonly known as: LIPITOR TAKE 1 TABLET BY MOUTH EVERY DAY   Contour Next Test test strip Generic drug:  glucose blood USE TO CHECK BLOOD SUGAR TWICE A DAY   Contour Next Test test strip Generic drug: glucose blood USE TO CHECK BLOOD SUGAR TWICE DAILY   Dilt-XR 240 MG 24 hr capsule Generic drug: diltiazem TAKE 1 CAPSULE BY MOUTH EVERY DAY   FreeStyle Libre 14 Day Sensor Misc 1 Units by Does not apply route every 14 (fourteen) days.   hydrochlorothiazide 12.5 MG capsule Commonly known as: MICROZIDE TAKE 1 CAPSULE BY MOUTH EVERY DAY   insulin glargine 100 UNIT/ML injection Commonly known as: Lantus INJECT 35 UNITS AT DINNER TIME   INSULIN SYRINGE 1CC/31GX5/16" 31G X 5/16" 1 ML Misc Use to inject lantus daily   Invokana 300 MG Tabs tablet Generic drug: canagliflozin TAKE 1 TABLET BY MOUTH EVERY DAY BEFOR BREAKFAST.   Klor-Con M10 10 MEQ tablet Generic drug: potassium chloride TAKE 1 TABLET BY MOUTH EVERY DAY   metFORMIN 500 MG 24 hr tablet Commonly known as: GLUCOPHAGE-XR TAKE 2 TABLETS BY MOUTH EVERY DAY WITH DINNER.   Microlet Lancets Misc Use to check blood sugar 2 times a day   NovoLOG FlexPen 100 UNIT/ML FlexPen Generic drug: insulin aspart INJECT 20 UNITS INTO THE SKIN 3 TIMES A DAY WITH MEALS   olmesartan 40  MG tablet Commonly known as: BENICAR 1 tablet daily   tamsulosin 0.4 MG Caps capsule Commonly known as: FLOMAX Take 0.4 mg by mouth at bedtime.       Allergies: No Known Allergies  Past Medical History:  Diagnosis Date  . Diabetes mellitus   . H/O epistaxis   . History of stomach ulcers    30 YRS AGO  . Hypercholesteremia   . Hypertension   . Shoulder pain, left   . Umbilical hernia     Past Surgical History:  Procedure Laterality Date  . CARDIAC CATHETERIZATION  1997  . COLONOSCOPY    . UMBILICAL HERNIA REPAIR N/A 07/11/2015   Procedure: LAPAROSCOPIC ASSISTED OPEN UMBILICAL HERNIA;  Surgeon: Johnathan Hausen, MD;  Location: [facility];  Service: General;  Laterality: N/A;  With MESH    Family History  Problem Relation Age of Onset  . Heart  disease Mother   . Diabetes Father   . Diabetes Sister     Social History:  reports that he quit smoking about 21 years ago. He has never used smokeless tobacco. He reports that he does not drink alcohol or use drugs.   Admission on 03/04/2019, Discharged on 03/05/2019  Component Date Value Ref Range Status  . Glucose-Capillary 03/04/2019 129* 70 - 99 mg/dL Final  . Lipase 03/04/2019 48  11 - 51 U/L Final   Performed at [facility], [place] [address], [place], [place] [address]  . Sodium 03/04/2019 139  135 - 145 mmol/L Final  . Potassium 03/04/2019 3.7  3.5 - 5.1 mmol/L Final  . Chloride 03/04/2019 104  98 - 111 mmol/L Final  . CO2 03/04/2019 24  22 - 32 mmol/L Final  . Glucose, Bld 03/04/2019 134* 70 - 99 mg/dL Final  . BUN 03/04/2019 17  8 - 23 mg/dL Final  . Creatinine, Ser 03/04/2019 0.90  0.61 - 1.24 mg/dL Final  . Calcium 03/04/2019 9.2  8.9 - 10.3 mg/dL Final  . Total Protein 03/04/2019 6.9  6.5 - 8.1 g/dL Final  . Albumin 03/04/2019 4.1  3.5 - 5.0 g/dL Final  . AST 03/04/2019 22  15 - 41 U/L Final  . ALT 03/04/2019 16  0 - 44 U/L Final  . Alkaline Phosphatase 03/04/2019 77  38 - 126 U/L Final  . Total Bilirubin 03/04/2019 0.4  0.3 - 1.2 mg/dL Final  . GFR calc non Af Amer 03/04/2019 >60  >60 mL/min Final  . GFR calc Af Amer 03/04/2019 >60  >60 mL/min Final  . Anion gap 03/04/2019 11  5 - 15 Final   Performed at [facility], [place] [address] [place], [place] [address]  . WBC 03/04/2019 5.8  4.0 - 10.5 K/uL Final  . RBC 03/04/2019 5.28  4.22 - 5.81 MIL/uL Final  . Hemoglobin 03/04/2019 15.5  13.0 - 17.0 g/dL Final  . HCT 03/04/2019 48.2  39.0 - 52.0 % Final  . MCV 03/04/2019 91.3  80.0 - 100.0 fL Final  . MCH 03/04/2019 29.4  26.0 - 34.0 pg Final  . MCHC 03/04/2019 32.2  30.0 - 36.0 g/dL Final  . RDW 03/04/2019 13.4  11.5 - 15.5 % Final  . Platelets 03/04/2019 237  150 - 400 K/uL Final  . nRBC 03/04/2019 0.0  0.0 - 0.2 % Final    Performed at [facility], [place] [address] [place], [place] [address]  . Color, Urine 03/04/2019 YELLOW  YELLOW Final  . APPearance 03/04/2019 CLEAR  CLEAR Final  . Specific Gravity, Urine 03/04/2019 1.037* 1.005 - 1.030 Final  . pH 03/04/2019 5.0  5.0 - 8.0 Final  . Glucose, UA 03/04/2019 >=500* NEGATIVE mg/dL Final  . Hgb urine dipstick 03/04/2019 NEGATIVE  NEGATIVE Final  . Bilirubin Urine 03/04/2019 NEGATIVE  NEGATIVE Final  . Ketones, ur 03/04/2019 NEGATIVE  NEGATIVE mg/dL Final  . Protein, ur 03/04/2019 NEGATIVE  NEGATIVE mg/dL Final  . Nitrite 03/04/2019 NEGATIVE  NEGATIVE Final  . Leukocytes,Ua 03/04/2019 NEGATIVE  NEGATIVE Final  . WBC, UA 03/04/2019 0-5  0 - 5 WBC/hpf Final  . Bacteria, UA 03/04/2019 NONE SEEN  NONE SEEN Final   Performed at [facility], [place] [address] [place], [place] [address]   Review of systems  He says he was having abdominal bloating and he was seen in the emergency room He also thinks that when he has the bloating on his left side he feels a little numbness on his leg also He is scheduled to see a neurologist Although he was told to take an OTC medication he does not know what it was  EXAM:  BP (!) 146/72 (BP Location: Left Arm, Patient Position: Sitting, Cuff Size: Normal)   Pulse 61   Ht 5\' 3"  (1.6 m)   Wt 183 lb 6.4 oz (83.2 kg)   SpO2 99%   BMI 32.49 kg/m   Physical Exam  No ankle edema present   Assessment/Plan:   Hypertension: He appears to have more significant hypertension now on despite using a 3 drug regimen including Benicar, diltiazem and HCTZ His medications were verified today in the office  Since his blood pressure appears to be better today with his taking an extra HCTZ he likely has sodium dependent hypertension  Recent potassium was low normal at 3.7  For convenience we will switch him from 12.5 mg HCTZ to Aldactazide 25/25 and stop his potassium supplement Explained to him the  basis for doing this He will need to come back in 2 weeks to follow-up and have his potassium rechecked  GI symptoms: He has bloating, gaseousness and discussed that he can leave off his Metformin for now Also he can take Mylanta and Prilosec for his symptoms OTC   Elayne Snare 03/07/2019, 1:27 PM

## 2019-03-07 NOTE — Patient Instructions (Addendum)
Take Mylanta gas 3x daily  Take Prilosec before Breakfast  Stop Potassium and HCTZ and start new Rx  Call to set up PCP

## 2019-03-14 ENCOUNTER — Other Ambulatory Visit: Payer: Self-pay

## 2019-03-14 DIAGNOSIS — Z20822 Contact with and (suspected) exposure to covid-19: Secondary | ICD-10-CM

## 2019-03-17 LAB — NOVEL CORONAVIRUS, NAA: SARS-CoV-2, NAA: NOT DETECTED

## 2019-03-22 LAB — HM DIABETES EYE EXAM

## 2019-03-27 ENCOUNTER — Other Ambulatory Visit: Payer: Self-pay | Admitting: Endocrinology

## 2019-03-29 ENCOUNTER — Other Ambulatory Visit: Payer: Self-pay | Admitting: Endocrinology

## 2019-04-08 ENCOUNTER — Other Ambulatory Visit: Payer: Self-pay

## 2019-04-08 MED ORDER — FREESTYLE LIBRE 14 DAY SENSOR MISC: 1.0000 [IU] | 4 refills | Status: DC

## 2019-04-11 ENCOUNTER — Ambulatory Visit: Payer: BLUE CROSS/BLUE SHIELD | Admitting: Neurology

## 2019-04-12 ENCOUNTER — Other Ambulatory Visit: Payer: Self-pay | Admitting: Endocrinology

## 2019-04-15 ENCOUNTER — Other Ambulatory Visit: Payer: Self-pay | Admitting: Endocrinology

## 2019-04-23 ENCOUNTER — Other Ambulatory Visit: Payer: Self-pay | Admitting: Endocrinology

## 2019-04-29 ENCOUNTER — Other Ambulatory Visit: Payer: Self-pay | Admitting: Endocrinology

## 2019-05-01 ENCOUNTER — Other Ambulatory Visit: Payer: Self-pay | Admitting: Endocrinology

## 2019-05-03 ENCOUNTER — Other Ambulatory Visit: Payer: Self-pay | Admitting: Endocrinology

## 2019-05-09 ENCOUNTER — Other Ambulatory Visit: Payer: Self-pay | Admitting: Endocrinology

## 2019-05-09 ENCOUNTER — Other Ambulatory Visit: Payer: Self-pay

## 2019-05-09 ENCOUNTER — Other Ambulatory Visit (INDEPENDENT_AMBULATORY_CARE_PROVIDER_SITE_OTHER): Payer: BLUE CROSS/BLUE SHIELD

## 2019-05-09 DIAGNOSIS — Z794 Long term (current) use of insulin: Secondary | ICD-10-CM

## 2019-05-09 DIAGNOSIS — E1165 Type 2 diabetes mellitus with hyperglycemia: Secondary | ICD-10-CM | POA: Diagnosis not present

## 2019-05-09 DIAGNOSIS — E7800 Pure hypercholesterolemia, unspecified: Secondary | ICD-10-CM | POA: Diagnosis not present

## 2019-05-09 LAB — COMPREHENSIVE METABOLIC PANEL
ALT: 19 U/L (ref 0–53)
AST: 20 U/L (ref 0–37)
Albumin: 4.2 g/dL (ref 3.5–5.2)
Alkaline Phosphatase: 73 U/L (ref 39–117)
BUN: 21 mg/dL (ref 6–23)
CO2: 27 mEq/L (ref 19–32)
Calcium: 9.3 mg/dL (ref 8.4–10.5)
Chloride: 101 mEq/L (ref 96–112)
Creatinine, Ser: 0.96 mg/dL (ref 0.40–1.50)
GFR: 95.35 mL/min (ref >60.00)
Glucose, Bld: 225 mg/dL — ABNORMAL HIGH (ref 70–99)
Potassium: 4.5 mEq/L (ref 3.5–5.1)
Sodium: 134 mEq/L — ABNORMAL LOW (ref 135–145)
Total Bilirubin: 0.3 mg/dL (ref 0.2–1.2)
Total Protein: 6.9 g/dL (ref 6.0–8.3)

## 2019-05-09 LAB — LIPID PANEL
Cholesterol: 218 mg/dL — ABNORMAL HIGH (ref 0–200)
HDL: 56.4 mg/dL (ref >39.00)
LDL Cholesterol: 137 mg/dL — ABNORMAL HIGH (ref 0–99)
NonHDL: 161.91
Total CHOL/HDL Ratio: 4
Triglycerides: 125 mg/dL (ref 0.0–149.0)
VLDL: 25 mg/dL (ref 0.0–40.0)

## 2019-05-09 LAB — HEMOGLOBIN A1C: Hgb A1c MFr Bld: 7.7 % — ABNORMAL HIGH (ref 4.6–6.5)

## 2019-05-10 ENCOUNTER — Other Ambulatory Visit: Payer: Self-pay

## 2019-05-12 ENCOUNTER — Encounter: Payer: Self-pay | Admitting: Endocrinology

## 2019-05-12 ENCOUNTER — Other Ambulatory Visit: Payer: Self-pay | Admitting: Endocrinology

## 2019-05-12 ENCOUNTER — Other Ambulatory Visit: Payer: Self-pay

## 2019-05-12 ENCOUNTER — Ambulatory Visit (INDEPENDENT_AMBULATORY_CARE_PROVIDER_SITE_OTHER): Payer: BLUE CROSS/BLUE SHIELD | Admitting: Endocrinology

## 2019-05-12 VITALS — BP 130/80 | HR 68 | Ht 63.0 in | Wt 185.0 lb

## 2019-05-12 DIAGNOSIS — Z794 Long term (current) use of insulin: Secondary | ICD-10-CM | POA: Diagnosis not present

## 2019-05-12 DIAGNOSIS — E1165 Type 2 diabetes mellitus with hyperglycemia: Secondary | ICD-10-CM

## 2019-05-12 DIAGNOSIS — E7800 Pure hypercholesterolemia, unspecified: Secondary | ICD-10-CM

## 2019-05-12 DIAGNOSIS — I10 Essential (primary) hypertension: Secondary | ICD-10-CM

## 2019-05-12 MED ORDER — TRESIBA FLEXTOUCH 200 UNIT/ML ~~LOC~~ SOPN: 44.0000 [IU] | PEN_INJECTOR | Freq: Every day | SUBCUTANEOUS | 1 refills | Status: DC

## 2019-05-12 NOTE — Patient Instructions (Addendum)
Check blood sugars on waking up days a week  Also check blood sugars about 2 hours after meals and do this after different meals by rotation  Recommended blood sugar levels on waking up are 90-130 and about 2 hours after meal is 130-180  Please bring your blood sugar monitor to each visit, thank you  Take at least 5 Novolog in am  Metformin in am also

## 2019-05-12 NOTE — Progress Notes (Signed)
Patient ID: Austin Norris, male   DOB: 1956-01-09, 64 y.o.   MRN: [id number]   Reason for Appointment: Follow-up   History of Present Illness   Diagnosis: Type 2 DIABETES MELITUS, long-standing     He has been on insulin to treat his diabetes for several years partly because of failure of oral agents and also for reducing cost of medications He is generally checking his blood sugars once or twice a day but does not keep a record Overall has had somewhat poor control with A1c consistently over 7%  Recent history:    Insulin regimen: Lantus 45 units at bedtime, Novolog usually 30 at dinner only  Oral hypoglycemic drugs: Invokana 300mg , metformin ER 1 g daily  A1c is relatively higher at 7.7 and continues to increase    Current blood sugar patterns, management and problems identified:   He did not bring his monitor for download; on his previous visit he was concerned about his Contour meter not being consistent but he did not start the freestyle libre  He says he had difficulty sticking the freestyle libre sensor and did not continue this or call for assistance  He now appears to be checking blood sugars only fasting and not any other time  As before he is not taking NovoLog in the morning even though he is eating carbohydrates and his blood sugar has been usually high in the lab after eating  Glucose was 225 after breakfast in the lab  He thinks that his fasting blood sugars are fluctuating although mostly slightly high  He has increased his NovoLog to 30 units at suppertime even though he does not know whether he had high readings after dinner  Usually not getting any hypoglycemia  Mostly eating 1 meal a day again  He does some walking while at work but no formal exercise usually  His weight has stayed about the same  Currently taking Metformin only in the evening, previously this was reduced because of history of gaseousness and abdominal  discomfort       Side effects from medications: None  Proper timing of medications in relation to meals: Yes.          Monitors blood glucose:  at least  Once a day.    Glucometer: Contour Blood Glucose readings from recall:  FASTING range 68-140, no other readings available     Meals: 2-3  meals per day, usually some form of bread and milk in the morning, relatively smaller lunch, sometimes having eggs at lunchtime., sometimes sweets in pm, fried food occasionally        Dinner usually 5-6 PM   Physical activity: exercise: Only with walking at work up to 7 days a week, is active in the parking lot of his car [place] visit: Most recent: None             Wt Readings from Last 3 Encounters:  05/12/19 185 lb (83.9 kg)  03/07/19 183 lb 6.4 oz (83.2 kg)  03/04/19 186 lb (84.4 kg)   LABS:  Lab Results  Component Value Date   HGBA1C 7.7 (H) 05/09/2019   HGBA1C 7.5 (H) 02/04/2019   HGBA1C 7.2 (H) 07/05/2018   Lab Results  Component Value Date   MICROALBUR 1.7 03/30/2018   LDLCALC 137 (H) 05/09/2019   CREATININE 0.96 05/09/2019    Other active problems:  See review of systems   Allergies as of 05/12/2019   No Known Allergies     Medication List       Accurate as of May 12, 2019  1:30 PM. If you have any questions, ask your nurse or doctor.        STOP taking these medications   FreeStyle Libre 14 Day Sensor Misc Stopped by: Elayne Snare, MD     TAKE these medications   aspirin 81 MG chewable tablet Chew 1 tablet (81 mg total) by mouth daily.   atorvastatin 40 MG tablet Commonly known as: LIPITOR TAKE 1 TABLET BY MOUTH EVERY DAY   Contour Next Test test strip Generic drug: glucose blood USE TO CHECK BLOOD SUGAR TWICE A DAY What changed: Another medication with the same name was removed. Continue taking this medication, and follow the directions you see here. Changed by: Elayne Snare, MD   Dilt-XR 240 MG 24 hr capsule Generic  drug: diltiazem TAKE 1 CAPSULE BY MOUTH EVERY DAY   INSULIN SYRINGE 1CC/31GX5/16" 31G X 5/16" 1 ML Misc Use to inject lantus daily   Invokana 300 MG Tabs tablet Generic drug: canagliflozin TAKE 1 TABLET BY MOUTH EVERY DAY BEFOR BREAKFAST.   Lantus SoloStar 100 UNIT/ML Solostar Pen Generic drug: Insulin Glargine INJECT 45 UNITS AT DINNER TIME   metFORMIN 500 MG 24 hr tablet Commonly known as: GLUCOPHAGE-XR TAKE 2 TABLETS BY MOUTH EVERY DAY WITH DINNER.   Microlet Lancets Misc Use to check blood sugar 2 times a day   NovoLOG FlexPen 100 UNIT/ML FlexPen Generic drug: insulin aspart Inject 35 Units into the skin 2 (two) times daily.   olmesartan 40 MG tablet Commonly known as: BENICAR TAKE 1 TABLET BY MOUTH EVERY DAY   spironolactone-hydrochlorothiazide 25-25 MG tablet Commonly known as: ALDACTAZIDE TAKE 1 TABLET BY MOUTH EVERY DAY       Allergies: No Known Allergies  Past Medical History:  Diagnosis Date  . Diabetes mellitus   . H/O epistaxis   . History of stomach ulcers    30 YRS AGO  . Hypercholesteremia   . Hypertension   . Shoulder pain, left   . Umbilical hernia     Past Surgical History:  Procedure Laterality Date  . CARDIAC CATHETERIZATION  1997  . COLONOSCOPY    . UMBILICAL HERNIA REPAIR N/A 07/11/2015   Procedure: LAPAROSCOPIC ASSISTED OPEN UMBILICAL HERNIA;  Surgeon: Johnathan Hausen, MD;  Location: [facility];  Service: General;  Laterality: N/A;  With MESH    Family History  Problem Relation Age of Onset  . Heart disease Mother   . Diabetes Father   . Diabetes Sister    No colon cancer  Social History:  reports that he quit smoking about 21 years ago. He has never used smokeless tobacco. He reports that he does not drink alcohol or use drugs.  Review of Systems:   HYPERTENSION:  he has had long-standing hypertension Is  taking Aldactazide, diltiazem and Benicar 40 mg  Amlodipine was changed to diltiazem because of swelling of his  legs Because of poor blood pressure control and tendency to hypokalemia he was changed from HCTZ to Aldactazide in 02/2019  Is also on Invokana Potassium is normal there is no change in renal function  BP Readings from Last 3 Encounters:  05/12/19 130/80  03/07/19 (!) 146/72  03/05/19 (!) 172/75   Lab Results  Component Value Date   CREATININE 0.96 05/09/2019   BUN 21 05/09/2019   NA 134 (  L) 05/09/2019   K 4.5 05/09/2019   CL 101 05/09/2019   CO2 27 05/09/2019     HYPERLIPIDEMIA: The lipid abnormality consists of elevated LDL.    He  is taking his Lipitor at the 40 mg dose.  However he was told to stop his medication when he was treated for H. pylori reportedly and he only started back 3 days ago with increase in his lipids   Has had some evidence of CAD in the past but asymptomatic   LDL has been controlled well previously  Lab Results  Component Value Date   CHOL 218 (H) 05/09/2019   CHOL 157 07/05/2018   CHOL 145 03/30/2018   Lab Results  Component Value Date   HDL 56.40 05/09/2019   HDL 59.20 07/05/2018   HDL 51.60 03/30/2018   Lab Results  Component Value Date   LDLCALC 137 (H) 05/09/2019   LDLCALC 83 07/05/2018   LDLCALC 66 03/30/2018   Lab Results  Component Value Date   TRIG 125.0 05/09/2019   TRIG 75.0 07/05/2018   TRIG 138.0 03/30/2018   Lab Results  Component Value Date   CHOLHDL 4 05/09/2019   CHOLHDL 3 07/05/2018   CHOLHDL 3 03/30/2018   Lab Results  Component Value Date   LDLDIRECT 89.8 12/29/2013    He has been followed by urologist for prostatism   He still has not established with a PCP for general care     Examination:   BP 130/80 (BP Location: Left Arm, Patient Position: Sitting, Cuff Size: Normal)   Pulse 68   Ht 5\' 3"  (1.6 m)   Wt 185 lb (83.9 kg)   SpO2 98%   BMI 32.77 kg/m   Body mass index is 32.77 kg/m.     ASSESSMENT/ PLAN:   Diabetes type 2 insulin-dependent  See history of present illness for detailed  discussion of his current management, blood sugar patterns and problems identified  His A1c is relatively higher at 7.7, has been as low as 6.8 previously  Not clear if his diet is playing a role but he tends to have high readings periodically overnight and likely will be having high postprandial readings also As discussed above he likely needs mealtime coverage for breakfast also since postprandial reading was over 200 Since his blood sugars fluctuate in the morning also he may do better with [place] compared to Lantus and discussed the differences between the 2; may also benefit from U-200 formulation because of its higher doses   RECOMMENDATIONS:  Start freestyle [place] for glucose testing with the help of the nurse educator, he has difficulty with the sensors sticking but likely needs to use the proper technique and topical adhesive  Discussed that he should be able to properly monitor his postprandial readings and overnight blood sugars  Start 44 units Tresiba instead of Lantus  Take at least 5 units of NovoLog at breakfast and check readings after eating  Protein like eggs consistently in the morning with his bread or other carbohydrates  Adjust the evening dose only based on readings 2 hours after dinner which he needs to monitor also  Additional Metformin in the morning also since he has been tolerating this better and abdominal discomfort is improved  HYPERTENSION: His blood pressure is finally well controlled Doing well with using Aldactazide in addition to his Benicar and diltiazem No further hypokalemia He will continue to use the same medications and also check periodically at home   LIPIDS: Previously controlled and  now LDL is higher because of not taking Lipitor regularly Discussed that he does not need to stop this because of drug interactions and needs to take this regularly long-term    There are no Patient Instructions on file for this visit.     Elayne Snare 05/12/2019, 1:30 PM   Note: This office note was prepared with Dragon voice recognition system technology. Any transcriptional errors that result from this process are unintentional.

## 2019-05-16 ENCOUNTER — Telehealth: Payer: Self-pay

## 2019-05-16 NOTE — Telephone Encounter (Signed)
Please find out why he wants to take this.   I need to have an idea about his blood sugar patterns with the freestyle libre first We will need to discuss this at his next appointment as this will require instructions on dosages, insulin adjustment at the same time Currently has no follow-up appointment

## 2019-05-16 NOTE — Telephone Encounter (Signed)
Patient called in wanting to be put on ozempic if Dr approves    [id number] - [place], [place] [address]   Please advise

## 2019-05-17 ENCOUNTER — Other Ambulatory Visit: Payer: Self-pay

## 2019-05-17 MED ORDER — NOVOLOG FLEXPEN 100 UNIT/ML ~~LOC~~ SOPN: 35.0000 [IU] | PEN_INJECTOR | Freq: Two times a day (BID) | SUBCUTANEOUS | 1 refills | Status: DC

## 2019-05-17 NOTE — Telephone Encounter (Signed)
Called pt and gave him MD message. Pt verbalized understanding of this.  Pt also requested refill of Novolog and this has been sent.   Pt then stated that the coupon Dr. Dwyane Dee gave him to lowe the cost of Tresiba to $5 per month has not worked. He would like an alternative.

## 2019-05-17 NOTE — Telephone Encounter (Signed)
Called pt and inquired about message below. PT stated that the cost of Lantus and Tyler Aas were the same. They both bost $20 for a month's supply. Pt did not want to pay this because he stated that Dr. Dwyane Dee gave him a coupon to get the [place] for $5 per month. Pt then stated that he would just get the [place] and pay $20 for it.

## 2019-05-17 NOTE — Telephone Encounter (Signed)
Was the coupon expired?  How much is the cost compared to Lantus?  He can check with his insurance if Toujeo would be better covered than [place]

## 2019-05-23 ENCOUNTER — Other Ambulatory Visit: Payer: Self-pay | Admitting: Endocrinology

## 2019-05-23 ENCOUNTER — Encounter: Payer: BLUE CROSS/BLUE SHIELD | Attending: Endocrinology | Admitting: Nutrition

## 2019-05-23 ENCOUNTER — Other Ambulatory Visit: Payer: Self-pay

## 2019-05-23 DIAGNOSIS — E1165 Type 2 diabetes mellitus with hyperglycemia: Secondary | ICD-10-CM | POA: Diagnosis not present

## 2019-05-27 ENCOUNTER — Other Ambulatory Visit: Payer: Self-pay | Admitting: Endocrinology

## 2019-05-27 NOTE — Telephone Encounter (Signed)
Last office visit note indicates that pt should take additional Metformin in the morning. Please specify how much.

## 2019-05-27 NOTE — Telephone Encounter (Signed)
Noted  

## 2019-05-27 NOTE — Telephone Encounter (Signed)
1 tablet

## 2019-05-31 NOTE — Progress Notes (Signed)
Mr. Tolhurst was trained on how to use the [facility] sensor.  We discussed the difference between sensor readings and blood sugar readings, and he reported good understanding of this.  He inserted the sensor into his left abdomen and liked the sensor to his receiver.  We set the date/time and he was shown how to start the sensor and do readings, after the 1 hour warm up period.  He had no final questions.

## 2019-05-31 NOTE — Patient Instructions (Signed)
Replace the sensor every 14 days. Scam sensor at least every 8 hours.

## 2019-06-20 ENCOUNTER — Other Ambulatory Visit: Payer: Self-pay | Admitting: Endocrinology

## 2019-07-04 ENCOUNTER — Other Ambulatory Visit: Payer: Self-pay | Admitting: Endocrinology

## 2019-07-11 ENCOUNTER — Telehealth: Payer: Self-pay | Admitting: Endocrinology

## 2019-07-11 NOTE — Telephone Encounter (Signed)
Patient was called.  He states his blood sugars have been in the 50s with the sensor early morning but he has not compared with the Contour meter.  Not clear if he is symptomatic.  This is even with taking only 25 units Tresiba instead of 44 last night.  Advised him to take only 15 [place] tonight and compare his freestyle Austin Norris with the Contour meter

## 2019-07-11 NOTE — Telephone Encounter (Signed)
Patient called to advise that his fasting AM blood sugars are in the 50's.  He is having to consume food, etc to counteract the lows.  Patient would like to know if he should make an appointment or if he can be assisted by phone.  Patient scheduled for labs 07/12/2019 and Dr Dwyane Dee 07/14/2019

## 2019-07-12 ENCOUNTER — Other Ambulatory Visit (INDEPENDENT_AMBULATORY_CARE_PROVIDER_SITE_OTHER): Payer: BLUE CROSS/BLUE SHIELD

## 2019-07-12 ENCOUNTER — Other Ambulatory Visit: Payer: Self-pay

## 2019-07-12 DIAGNOSIS — Z794 Long term (current) use of insulin: Secondary | ICD-10-CM

## 2019-07-12 DIAGNOSIS — E7800 Pure hypercholesterolemia, unspecified: Secondary | ICD-10-CM

## 2019-07-12 DIAGNOSIS — E1165 Type 2 diabetes mellitus with hyperglycemia: Secondary | ICD-10-CM

## 2019-07-12 LAB — COMPREHENSIVE METABOLIC PANEL
ALT: 16 U/L (ref 0–53)
AST: 17 U/L (ref 0–37)
Albumin: 4.3 g/dL (ref 3.5–5.2)
Alkaline Phosphatase: 94 U/L (ref 39–117)
BUN: 17 mg/dL (ref 6–23)
CO2: 31 mEq/L (ref 19–32)
Calcium: 9.5 mg/dL (ref 8.4–10.5)
Chloride: 98 mEq/L (ref 96–112)
Creatinine, Ser: 0.89 mg/dL (ref 0.40–1.50)
GFR: 104 mL/min (ref >60.00)
Glucose, Bld: 101 mg/dL — ABNORMAL HIGH (ref 70–99)
Potassium: 3.8 mEq/L (ref 3.5–5.1)
Sodium: 134 mEq/L — ABNORMAL LOW (ref 135–145)
Total Bilirubin: 0.3 mg/dL (ref 0.2–1.2)
Total Protein: 7.4 g/dL (ref 6.0–8.3)

## 2019-07-12 LAB — URINALYSIS, ROUTINE W REFLEX MICROSCOPIC
Bilirubin Urine: NEGATIVE
Hgb urine dipstick: NEGATIVE
Ketones, ur: NEGATIVE
Leukocytes,Ua: NEGATIVE
Nitrite: NEGATIVE
RBC / HPF: NONE SEEN (ref 0–?)
Specific Gravity, Urine: >=1.03 — AB (ref 1.000–1.030)
Total Protein, Urine: NEGATIVE
Urine Glucose: >=1000 — AB
Urobilinogen, UA: 0.2 (ref 0.0–1.0)
WBC, UA: NONE SEEN (ref 0–?)
pH: 5 (ref 5.0–8.0)

## 2019-07-12 LAB — LIPID PANEL
Cholesterol: 157 mg/dL (ref 0–200)
HDL: 47.2 mg/dL (ref >39.00)
LDL Cholesterol: 79 mg/dL (ref 0–99)
NonHDL: 110.02
Total CHOL/HDL Ratio: 3
Triglycerides: 157 mg/dL — ABNORMAL HIGH (ref 0.0–149.0)
VLDL: 31.4 mg/dL (ref 0.0–40.0)

## 2019-07-12 LAB — HEMOGLOBIN A1C: Hgb A1c MFr Bld: 7 % — ABNORMAL HIGH (ref 4.6–6.5)

## 2019-07-12 LAB — MICROALBUMIN / CREATININE URINE RATIO
Creatinine,U: 90 mg/dL
Microalb Creat Ratio: 1.3 mg/g (ref 0.0–30.0)
Microalb, Ur: 1.2 mg/dL (ref 0.0–1.9)

## 2019-07-14 ENCOUNTER — Encounter: Payer: Self-pay | Admitting: Endocrinology

## 2019-07-14 ENCOUNTER — Other Ambulatory Visit: Payer: Self-pay

## 2019-07-14 ENCOUNTER — Ambulatory Visit (INDEPENDENT_AMBULATORY_CARE_PROVIDER_SITE_OTHER): Payer: BLUE CROSS/BLUE SHIELD | Admitting: Endocrinology

## 2019-07-14 VITALS — BP 120/82 | HR 70 | Ht 63.0 in | Wt 184.6 lb

## 2019-07-14 DIAGNOSIS — Z794 Long term (current) use of insulin: Secondary | ICD-10-CM | POA: Diagnosis not present

## 2019-07-14 DIAGNOSIS — E7800 Pure hypercholesterolemia, unspecified: Secondary | ICD-10-CM

## 2019-07-14 DIAGNOSIS — E1165 Type 2 diabetes mellitus with hyperglycemia: Secondary | ICD-10-CM

## 2019-07-14 LAB — GLUCOSE, POCT (MANUAL RESULT ENTRY): POC Glucose: 144 mg/dl — AB (ref 70–99)

## 2019-07-14 NOTE — Progress Notes (Signed)
Patient ID: Austin Norris, male   DOB: 08/13/1955, 65 y.o.   MRN: [id number]   Reason for Appointment: Follow-up   History of Present Illness   Diagnosis: Type 2 DIABETES MELITUS, long-standing     He has been on insulin to treat his diabetes for several years partly because of failure of oral agents and also for reducing cost of medications He is generally checking his blood sugars once or twice a day but does not keep a record Overall has had somewhat poor control with A1c consistently over 7%  Recent history:    Insulin regimen: Tresiba 25 units at bedtime, Novolog usually 10 --5/10 10/15 at dinner only  Oral hypoglycemic drugs: Invokana 300mg , metformin ER 1 g daily  A1c is relatively better at 7 compared to 7.7    Current blood sugar patterns, management and problems identified:   He has falsely low readings on his freestyle libre  When he checked his fingerstick his glucose reading was up to 50 mg lower than the actual reading  Since yesterday however with using a new sensor he says his blood sugars appear more accurate although today in the office it is still about 20 mg lower than the actual reading of 147 in the office  He was concerned about hypoglycemia even though he has been minimally symptomatic when his blood sugars are reading low on his freestyle libre  However he says that he has significantly changed his diet and cutting back on carbohydrates and bread since he had abdominal issues  With this he has cut down his basal insulin by about 15 units  Also benefiting from switching from Lantus to [facility]  He still appears to have relatively good readings fasting with his reduced basal insulin  As directed he is trying to take some insulin with breakfast and lunch also  However today since his blood sugar was normal at 79 before breakfast he did not take any NovoLog and blood sugar was up to 204 postprandially  He will reduce his lunchtime dose  to 5 units when eating a small meal  He does some walking while at work but no formal exercise   His weight has stayed about the same  Usually Metformin only in the evening, previously this was reduced because of history of gaseousness and abdominal discomfort       Side effects from medications: None  Proper timing of medications in relation to meals: Yes.          Monitors blood glucose:  Recently freestyle libre  CGM use % of time  96  2-week average/SD  127, GV 34.5  Time in range       85%  % Time Above 180 7  % Time above 250 2  % Time Below 70 6     PRE-MEAL Fasting Lunch Dinner Bedtime Overall  Glucose range:       Averages:  113  108  120   127   POST-MEAL PC Breakfast PC Lunch PC Dinner  Glucose range:     Averages:  146  117  152      Meals: 2-3  meals per day, usually some form of bread and milk in the morning, relatively smaller lunch, sometimes having eggs at lunchtime., sometimes sweets in pm, fried food occasionally        Dinner usually 5-6 PM   Physical activity: exercise: Only with walking at work up to  7 days a week, is active in the parking lot of his car [place] visit: Most recent: None             Wt Readings from Last 3 Encounters:  07/14/19 184 lb 9.6 oz (83.7 kg)  05/12/19 185 lb (83.9 kg)  03/07/19 183 lb 6.4 oz (83.2 kg)   LABS:  Lab Results  Component Value Date   HGBA1C 7.0 (H) 07/12/2019   HGBA1C 7.7 (H) 05/09/2019   HGBA1C 7.5 (H) 02/04/2019   Lab Results  Component Value Date   MICROALBUR 1.2 07/12/2019   LDLCALC 79 07/12/2019   CREATININE 0.89 07/12/2019    Other active problems: See review of systems   Allergies as of 07/14/2019   No Known Allergies     Medication List       Accurate as of July 14, 2019 11:59 PM. If you have any questions, ask your nurse or doctor.        aspirin 81 MG chewable tablet Chew 1 tablet (81 mg total) by mouth daily.   atorvastatin 40 MG  tablet Commonly known as: LIPITOR TAKE 1 TABLET BY MOUTH EVERY DAY   Contour Next Test test strip Generic drug: glucose blood USE TO CHECK BLOOD SUGAR TWICE A DAY   Dilt-XR 240 MG 24 hr capsule Generic drug: diltiazem TAKE 1 CAPSULE BY MOUTH EVERY DAY   INSULIN SYRINGE 1CC/31GX5/16" 31G X 5/16" 1 ML Misc Use to inject lantus daily   Invokana 300 MG Tabs tablet Generic drug: canagliflozin TAKE 1 TABLET BY MOUTH EVERY DAY BEFOR BREAKFAST.   metFORMIN 500 MG 24 hr tablet Commonly known as: GLUCOPHAGE-XR Take 1 tablet by mouth in the morning and 2 tablets in the evening.   Microlet Lancets Misc Use to check blood sugar 2 times a day   NovoLOG FlexPen 100 UNIT/ML FlexPen Generic drug: insulin aspart Inject 35 Units into the skin 2 (two) times daily. What changed: when to take this   olmesartan 40 MG tablet Commonly known as: BENICAR TAKE 1 TABLET BY MOUTH EVERY DAY   spironolactone-hydrochlorothiazide 25-25 MG tablet Commonly known as: ALDACTAZIDE TAKE 1 TABLET BY MOUTH EVERY DAY   Tresiba FlexTouch 200 UNIT/ML FlexTouch Pen Generic drug: insulin degludec Inject 44 Units into the skin daily. What changed: how much to take       Allergies: No Known Allergies  Past Medical History:  Diagnosis Date  . Diabetes mellitus   . H/O epistaxis   . History of stomach ulcers    30 YRS AGO  . Hypercholesteremia   . Hypertension   . Shoulder pain, left   . Umbilical hernia     Past Surgical History:  Procedure Laterality Date  . CARDIAC CATHETERIZATION  1997  . COLONOSCOPY    . UMBILICAL HERNIA REPAIR N/A 07/11/2015   Procedure: LAPAROSCOPIC ASSISTED OPEN UMBILICAL HERNIA;  Surgeon: Johnathan Hausen, MD;  Location: [facility];  Service: General;  Laterality: N/A;  With MESH    Family History  Problem Relation Age of Onset  . Heart disease Mother   . Diabetes Father   . Diabetes Sister    No colon cancer  Social History:  reports that he quit smoking about 22 years  ago. He has never used smokeless tobacco. He reports that he does not drink alcohol or use drugs.  Review of Systems:   HYPERTENSION:  he has had long-standing hypertension Is  taking Aldactazide, diltiazem and Benicar 40 mg  Amlodipine was changed to diltiazem because of swelling of his legs  Switched to Aldactazide in 02/2019 and blood pressure is better controlled  Is also on Invokana Potassium is normal there is no change in renal function recently  BP Readings from Last 3 Encounters:  07/14/19 120/82  05/12/19 130/80  03/07/19 (!) 146/72   Lab Results  Component Value Date   CREATININE 0.89 07/12/2019   BUN 17 07/12/2019   NA 134 (L) 07/12/2019   K 3.8 07/12/2019   CL 98 07/12/2019   CO2 31 07/12/2019     HYPERLIPIDEMIA: The lipid abnormality consists of elevated LDL.    He  is taking his Lipitor at the 40 mg dose.  Has not stopped this recently and LDL is improved significantly   Has had some evidence of CAD in the past but asymptomatic   LDL has been controlled well previously  Lab Results  Component Value Date   CHOL 157 07/12/2019   CHOL 218 (H) 05/09/2019   CHOL 157 07/05/2018   Lab Results  Component Value Date   HDL 47.20 07/12/2019   HDL 56.40 05/09/2019   HDL 59.20 07/05/2018   Lab Results  Component Value Date   LDLCALC 79 07/12/2019   LDLCALC 137 (H) 05/09/2019   LDLCALC 83 07/05/2018   Lab Results  Component Value Date   TRIG 157.0 (H) 07/12/2019   TRIG 125.0 05/09/2019   TRIG 75.0 07/05/2018   Lab Results  Component Value Date   CHOLHDL 3 07/12/2019   CHOLHDL 4 05/09/2019   CHOLHDL 3 07/05/2018   Lab Results  Component Value Date   LDLDIRECT 89.8 12/29/2013    He has been followed by urologist for prostatism  He was seen by gastroenterologist and felt to have H. pylori infection along with reflux     Examination:   BP 120/82   Pulse 70   Ht 5\' 3"  (1.6 m)   Wt 184 lb 9.6 oz (83.7 kg)   SpO2 99%   BMI 32.70 kg/m    Body mass index is 32.7 kg/m.     ASSESSMENT/ PLAN:   Diabetes type 2 insulin-dependent  See history of present illness for detailed discussion of his current management, blood sugar patterns and problems identified  His A1c is improved at 7%  He is now using the freestyle libre  With his cutting back on carbohydrates and higher fat meals he appears to have better blood sugars and also reduce insulin requirement especially basal Also usually taking less insulin at dinnertime compared to before Difficult to assess his blood sugar patterns Elenor Legato is reading falsely low This is partly related to as applying the [place] on his abdomen instead of the arm although his most recent sensor is relatively more accurate  He still is benefiting from continuing [place]: Very well controlled with current regimen and no change in potassium or kidney function   RECOMMENDATIONS:  Start using the freestyle libre on his arm from the next sensor  Need to continue monitoring fingerstick blood sugars  Also may need to adjust his estimate of his blood sugar up by 20 especially when blood sugars are reading low on his freestyle libre  Encouraged him to take at least 5 to 6 units insulin for breakfast regardless of premeal blood sugar to cover his food  Keep adjusting his suppertime dose based on how well his postprandial readings are,  No change in [place] dose  as yet since fasting readings appear to be not low with today's blood sugar reading  Encouraged him to walk regularly for exercise    LIPIDS: Better controlled and now LDL is back to target with taking Lipitor regularly     There are no Patient Instructions on file for this visit.     Elayne Snare 07/15/2019, 11:43 AM   Note: This office note was prepared with Dragon voice recognition system technology. Any transcriptional errors that result from this process are unintentional.

## 2019-07-26 ENCOUNTER — Other Ambulatory Visit: Payer: Self-pay | Admitting: Endocrinology

## 2019-08-07 ENCOUNTER — Other Ambulatory Visit: Payer: Self-pay | Admitting: Endocrinology

## 2019-08-17 ENCOUNTER — Other Ambulatory Visit: Payer: Self-pay | Admitting: Endocrinology

## 2019-08-19 ENCOUNTER — Other Ambulatory Visit: Payer: Self-pay | Admitting: Endocrinology

## 2019-08-25 ENCOUNTER — Other Ambulatory Visit: Payer: Self-pay | Admitting: Endocrinology

## 2019-09-25 ENCOUNTER — Other Ambulatory Visit: Payer: Self-pay | Admitting: Endocrinology

## 2019-10-03 ENCOUNTER — Other Ambulatory Visit: Payer: Self-pay

## 2019-10-03 MED ORDER — OLMESARTAN MEDOXOMIL 40 MG PO TABS: ORAL_TABLET | ORAL | 1 refills | Status: DC

## 2019-10-23 ENCOUNTER — Other Ambulatory Visit: Payer: Self-pay | Admitting: Endocrinology

## 2019-11-09 ENCOUNTER — Other Ambulatory Visit: Payer: Self-pay | Admitting: Endocrinology

## 2019-11-09 NOTE — Telephone Encounter (Signed)
Dr Dwyane Dee is this ok to refill?

## 2019-11-16 ENCOUNTER — Other Ambulatory Visit: Payer: Self-pay | Admitting: Endocrinology

## 2019-11-16 DIAGNOSIS — Z794 Long term (current) use of insulin: Secondary | ICD-10-CM

## 2019-11-16 DIAGNOSIS — E1165 Type 2 diabetes mellitus with hyperglycemia: Secondary | ICD-10-CM

## 2019-11-18 ENCOUNTER — Other Ambulatory Visit (INDEPENDENT_AMBULATORY_CARE_PROVIDER_SITE_OTHER): Payer: 59

## 2019-11-18 ENCOUNTER — Other Ambulatory Visit: Payer: Self-pay

## 2019-11-18 DIAGNOSIS — E1165 Type 2 diabetes mellitus with hyperglycemia: Secondary | ICD-10-CM

## 2019-11-18 DIAGNOSIS — Z794 Long term (current) use of insulin: Secondary | ICD-10-CM | POA: Diagnosis not present

## 2019-11-18 LAB — BASIC METABOLIC PANEL
BUN: 19 mg/dL (ref 6–23)
CO2: 30 mEq/L (ref 19–32)
Calcium: 9.7 mg/dL (ref 8.4–10.5)
Chloride: 99 mEq/L (ref 96–112)
Creatinine, Ser: 1.12 mg/dL (ref 0.40–1.50)
GFR: 79.68 mL/min (ref >60.00)
Glucose, Bld: 187 mg/dL — ABNORMAL HIGH (ref 70–99)
Potassium: 4.8 mEq/L (ref 3.5–5.1)
Sodium: 135 mEq/L (ref 135–145)

## 2019-11-18 LAB — HEMOGLOBIN A1C: Hgb A1c MFr Bld: 7.2 % — ABNORMAL HIGH (ref 4.6–6.5)

## 2019-11-23 ENCOUNTER — Ambulatory Visit (INDEPENDENT_AMBULATORY_CARE_PROVIDER_SITE_OTHER): Payer: 59 | Admitting: Endocrinology

## 2019-11-23 ENCOUNTER — Encounter: Payer: Self-pay | Admitting: Endocrinology

## 2019-11-23 ENCOUNTER — Other Ambulatory Visit: Payer: Self-pay

## 2019-11-23 VITALS — BP 110/80 | HR 56 | Ht 63.0 in | Wt 188.0 lb

## 2019-11-23 DIAGNOSIS — E1165 Type 2 diabetes mellitus with hyperglycemia: Secondary | ICD-10-CM | POA: Diagnosis not present

## 2019-11-23 DIAGNOSIS — E7800 Pure hypercholesterolemia, unspecified: Secondary | ICD-10-CM | POA: Diagnosis not present

## 2019-11-23 DIAGNOSIS — I10 Essential (primary) hypertension: Secondary | ICD-10-CM

## 2019-11-23 DIAGNOSIS — Z794 Long term (current) use of insulin: Secondary | ICD-10-CM | POA: Diagnosis not present

## 2019-11-23 LAB — POCT GLUCOSE (DEVICE FOR HOME USE): POC Glucose: 181 mg/dl — AB (ref 70–99)

## 2019-11-23 MED ORDER — CONTOUR NEXT TEST VI STRP: ORAL_STRIP | 2 refills | Status: AC

## 2019-11-23 NOTE — Progress Notes (Signed)
Patient ID: Austin Norris, male   DOB: [birth year], 64 y.o.   MRN: [id number]   Reason for Appointment: Follow-up   History of Present Illness   Diagnosis: Type 2 DIABETES MELITUS, long-standing     He has been on insulin to treat his diabetes for several years partly because of failure of oral agents and also for reducing cost of medications He is generally checking his blood sugars once or twice a day but does not keep a record Overall has had somewhat poor control with A1c consistently over 7%  Recent history:    Insulin regimen: Tresiba 44 units at bedtime, Novolog usually 10 --5/10 20 at dinner    Oral hypoglycemic drugs: Invokana 300mg , metformin ER 1 g daily  A1c is 7.2  Current blood sugar patterns, management and problems identified:   He still has falsely low readings on his freestyle libre  This is despite his using the [place] sensor on his arm instead of his abdomen as discussed previously  As discussed in detail below his blood sugars are generally well controlled, however average on his Elenor Legato is 150  Today in the office his freestyle Elenor Legato is reading 30 mg below the actual reading of 181  He is concerned about his blood sugar fluctuation but is monitoring his blood sugar numerous times during the day  No consistent pattern available  However blood sugars are fluctuating during the night with relatively high readings but occasionally may be near normal especially after midnight  His weight has gone up about 4 pounds since last visit  Usually Metformin only in the evening, previously this was reduced because of history of gaseousness and abdominal discomfort       Side effects from medications: None  Proper timing of medications in relation to meals: Yes.          Monitors blood glucose:  freestyle libre   CONTINUOUS GLUCOSE MONITORING RECORD INTERPRETATION    Dates of Recording: Last 2 weeks  Sensor description: Freestyle  libre  Results statistics:   CGM use % of time  95  Average and SD  150, GV 30  Time in range  76     %  % Time Above 180  21  % Time above 250   % Time Below target 1    PRE-MEAL Fasting Lunch Dinner Bedtime Overall  Glucose range:       Mean/median:  157  167  133  150  150   POST-MEAL PC Breakfast PC Lunch PC Dinner  Glucose range:     Mean/median:  158  143  141    Glycemic patterns summary: He still has falsely low readings most of the time on his freestyle libre Blood sugars are on an average running about 140-160 most of the time with more hypoglycemia early morning hours and occasionally after meals Hypoglycemia has been minimal and only once right after midnight  Hyperglycemic episodes mostly occurring overnight, sometimes prolonged, once rebounding from low sugar.  Also occasionally occurring after breakfast and once after dinner  Hypoglycemic episodes occurred only once possibly from excessive overcorrection at bedtime  Overnight periods: Blood sugars are significantly variable especially early at night and averaging about 150 but highest between 4-6 AM when they are averaging 170  Preprandial periods: Blood sugars are averaging about 150-160 at breakfast and lunch but generally lower at dinnertime  Postprandial periods:   Hyperglycemia has been minimal, may also  tend to have relatively low readings after lunch and occasionally after dinner. Has high readings after breakfast and dinner on occasion especially after breakfast.  Blood sugar was persistently high overnight after dinner on the night of 7/19  Previous data:   CGM use % of time  96  2-week average/SD  127, GV 34.5  Time in range       85%  % Time Above 180 7  % Time above 250 2  % Time Below 70 6     PRE-MEAL Fasting Lunch Dinner Bedtime Overall  Glucose range:       Averages:  113  108  120   127   POST-MEAL PC Breakfast PC Lunch PC Dinner  Glucose range:     Averages:  146  117  152       Meals: 2-3  meals per day, usually some form of bread and milk in the morning, relatively smaller lunch, sometimes having eggs at lunchtime., sometimes sweets in pm, fried food occasionally        Dinner usually 5-6 PM   Physical activity: exercise: Only with walking at work up to 7 days a week, is active in the parking lot of his car [place] visit: Most recent: None             Wt Readings from Last 3 Encounters:  11/23/19 188 lb (85.3 kg)  07/14/19 184 lb 9.6 oz (83.7 kg)  05/12/19 185 lb (83.9 kg)   LABS:  Lab Results  Component Value Date   HGBA1C 7.2 (H) 11/18/2019   HGBA1C 7.0 (H) 07/12/2019   HGBA1C 7.7 (H) 05/09/2019   Lab Results  Component Value Date   MICROALBUR 1.2 07/12/2019   LDLCALC 79 07/12/2019   CREATININE 1.12 11/18/2019    Other active problems: See review of systems   Allergies as of 11/23/2019   No Known Allergies     Medication List       Accurate as of November 23, 2019 11:59 PM. If you have any questions, ask your nurse or doctor.        aspirin 81 MG chewable tablet Chew 1 tablet (81 mg total) by mouth daily.   atorvastatin 40 MG tablet Commonly known as: LIPITOR TAKE 1 TABLET BY MOUTH EVERY DAY   Contour Next Test test strip Generic drug: glucose blood USE TO CHECK BLOOD SUGAR TWICE A DAY   Dilt-XR 240 MG 24 hr capsule Generic drug: diltiazem TAKE 1 CAPSULE BY MOUTH EVERY DAY   INSULIN SYRINGE 1CC/31GX5/16" 31G X 5/16" 1 ML Misc Use to inject lantus daily   Invokana 300 MG Tabs tablet Generic drug: canagliflozin TAKE 1 TABLET BY MOUTH EVERY DAY BEFORE BREAKFAST   metFORMIN 500 MG 24 hr tablet Commonly known as: GLUCOPHAGE-XR TAKE 1 TABLET BY MOUTH IN THE MORNING AND 2 TABLETS IN THE EVENING   Microlet Lancets Misc Use to check blood sugar 2 times a day   NovoLOG FlexPen 100 UNIT/ML FlexPen Generic drug: insulin aspart Inject 35 Units into the skin in the morning, at noon, and at  bedtime.   olmesartan 40 MG tablet Commonly known as: BENICAR TAKE 1 TABLET BY MOUTH EVERY DAY   spironolactone-hydrochlorothiazide 25-25 MG tablet Commonly known as: ALDACTAZIDE TAKE 1 TABLET BY MOUTH EVERY DAY   Tresiba FlexTouch 200 UNIT/ML FlexTouch Pen Generic drug: insulin degludec INJECT 44 UNITS INTO THE SKIN DAILY  Allergies: No Known Allergies  Past Medical History:  Diagnosis Date  . Diabetes mellitus   . H/O epistaxis   . History of stomach ulcers    30 YRS AGO  . Hypercholesteremia   . Hypertension   . Shoulder pain, left   . Umbilical hernia     Past Surgical History:  Procedure Laterality Date  . CARDIAC CATHETERIZATION  1997  . COLONOSCOPY    . UMBILICAL HERNIA REPAIR N/A 07/11/2015   Procedure: LAPAROSCOPIC ASSISTED OPEN UMBILICAL HERNIA;  Surgeon: Johnathan Hausen, MD;  Location: [facility];  Service: General;  Laterality: N/A;  With MESH    Family History  Problem Relation Age of Onset  . Heart disease Mother   . Diabetes Father   . Diabetes Sister    No colon cancer  Social History:  reports that he quit smoking about 22 years ago. He has never used smokeless tobacco. He reports that he does not drink alcohol and does not use drugs.  Review of Systems:   HYPERTENSION:  he has had long-standing hypertension Is  taking Aldactazide, diltiazem and Benicar 40 mg  Amlodipine was changed to diltiazem because of swelling of his legs  Started Aldactazide in 02/2019 and blood pressure is better controlled since then without any change in potassium or renal function  Is also on Invokana  BP Readings from Last 3 Encounters:  11/23/19 110/80  07/14/19 120/82  05/12/19 130/80   Lab Results  Component Value Date   CREATININE 1.12 11/18/2019   BUN 19 11/18/2019   NA 135 11/18/2019   K 4.8 11/18/2019   CL 99 11/18/2019   CO2 30 11/18/2019     HYPERLIPIDEMIA: The lipid abnormality consists of elevated LDL.    He  is taking his Lipitor at  the 40 mg dose Last LDL was fairly good   Has had some evidence of CAD in the past but asymptomatic    Lab Results  Component Value Date   CHOL 157 07/12/2019   CHOL 218 (H) 05/09/2019   CHOL 157 07/05/2018   Lab Results  Component Value Date   HDL 47.20 07/12/2019   HDL 56.40 05/09/2019   HDL 59.20 07/05/2018   Lab Results  Component Value Date   LDLCALC 79 07/12/2019   LDLCALC 137 (H) 05/09/2019   LDLCALC 83 07/05/2018   Lab Results  Component Value Date   TRIG 157.0 (H) 07/12/2019   TRIG 125.0 05/09/2019   TRIG 75.0 07/05/2018   Lab Results  Component Value Date   CHOLHDL 3 07/12/2019   CHOLHDL 4 05/09/2019   CHOLHDL 3 07/05/2018   Lab Results  Component Value Date   LDLDIRECT 89.8 12/29/2013    He has been followed by urologist for prostatism  He was seen by gastroenterologist and felt to have H. pylori infection along with reflux     Examination:   BP 110/80 (BP Location: Left Arm, Patient Position: Sitting, Cuff Size: Normal)   Pulse 56   Ht 5\' 3"  (1.6 m)   Wt 188 lb (85.3 kg)   SpO2 94%   BMI 33.30 kg/m   Body mass index is 33.3 kg/m.     ASSESSMENT/ PLAN:   Diabetes type 2 insulin-dependent  See history of present illness for detailed discussion of his current management, blood sugar patterns and problems identified  His A1c is 7.2  He has gone up on his basal insulin, previously requiring much less when he was doing his fast for Ramadan With  the [place] he is able to better adjust his mealtime insulin also and generally blood sugars are well controlled However has fluctuation in blood sugars overnight and appears to need relatively more basal rate at night  He still is benefiting from continuing [place]: Consistently well controlled with current regimen  Although is concerned about taking multiple medications his control is much better than before and recently no change in potassium or renal function with this also No  microalbuminuria   RECOMMENDATIONS:  Since he prefers the freestyle Elenor Legato can continue that but periodically compared with fingersticks  Likely needs more basal insulin overnight but with because of variability and occasional relatively low readings will not increase the [place] as yet  He is usually able to estimate his carbohydrate coverage fairly well but sometimes will not take enough insulin for breakfast for fear of hypoglycemia when walking at work  [employer]  He needs to make sure he has some protein with all his meals and not just carbohydrate   Hypercholesterolemia: LDL is back to target with taking Lipitor regularly     There are no Patient Instructions on file for this visit.     Elayne Snare 11/25/2019, 4:07 PM   Note: This office note was prepared with Dragon voice recognition system technology. Any transcriptional errors that result from this process are unintentional.

## 2019-11-25 ENCOUNTER — Encounter: Payer: Self-pay | Admitting: Endocrinology

## 2019-11-30 ENCOUNTER — Other Ambulatory Visit: Payer: Self-pay | Admitting: Endocrinology

## 2019-12-01 ENCOUNTER — Other Ambulatory Visit: Payer: Self-pay | Admitting: Endocrinology

## 2019-12-02 ENCOUNTER — Other Ambulatory Visit: Payer: Self-pay | Admitting: Endocrinology

## 2019-12-20 ENCOUNTER — Other Ambulatory Visit: Payer: Self-pay | Admitting: Endocrinology

## 2019-12-28 ENCOUNTER — Other Ambulatory Visit: Payer: Self-pay | Admitting: Endocrinology

## 2020-01-19 ENCOUNTER — Other Ambulatory Visit: Payer: Self-pay | Admitting: Endocrinology

## 2020-01-23 ENCOUNTER — Telehealth: Payer: Self-pay | Admitting: Endocrinology

## 2020-01-23 NOTE — Telephone Encounter (Signed)
Patient called stating his feet are burning and asked if Dr or nurse could give him a call to discuss. Ph# [phone]

## 2020-01-24 MED ORDER — GABAPENTIN 100 MG PO CAPS: 100.0000 mg | ORAL_CAPSULE | Freq: Three times a day (TID) | ORAL | 0 refills | Status: DC

## 2020-01-24 NOTE — Telephone Encounter (Signed)
Patient states that he is having burning in both of his feet. It comes and goes and it's happening everyday. Patient states that it started 4 days ago and last for about 15-20 minutes. Patient states that there has been no change in any medications. Please advise

## 2020-01-24 NOTE — Telephone Encounter (Signed)
Patient has been notified and medication has been sent to pharmacy

## 2020-01-24 NOTE — Addendum Note (Signed)
Addended by: Jefferson Fuel on: 01/24/2020 11:38 AM   Modules accepted: Orders

## 2020-01-24 NOTE — Telephone Encounter (Signed)
This is likely to be from effect of blood sugars on nerve endings.  Will need to send prescription for gabapentin 100 mg 3 times daily as needed with food for relief of symptoms

## 2020-02-12 ENCOUNTER — Other Ambulatory Visit: Payer: Self-pay | Admitting: Endocrinology

## 2020-02-14 ENCOUNTER — Other Ambulatory Visit: Payer: Self-pay | Admitting: Endocrinology

## 2020-02-19 ENCOUNTER — Other Ambulatory Visit: Payer: Self-pay | Admitting: Endocrinology

## 2020-03-12 ENCOUNTER — Other Ambulatory Visit: Payer: Self-pay | Admitting: Endocrinology

## 2020-03-12 DIAGNOSIS — N401 Enlarged prostate with lower urinary tract symptoms: Secondary | ICD-10-CM | POA: Diagnosis not present

## 2020-03-14 ENCOUNTER — Other Ambulatory Visit: Payer: Self-pay | Admitting: Endocrinology

## 2020-03-14 DIAGNOSIS — N410 Acute prostatitis: Secondary | ICD-10-CM | POA: Diagnosis not present

## 2020-03-14 DIAGNOSIS — R8279 Other abnormal findings on microbiological examination of urine: Secondary | ICD-10-CM | POA: Diagnosis not present

## 2020-03-15 ENCOUNTER — Other Ambulatory Visit: Payer: Self-pay | Admitting: Endocrinology

## 2020-03-16 ENCOUNTER — Other Ambulatory Visit: Payer: Self-pay | Admitting: Endocrinology

## 2020-03-16 DIAGNOSIS — N410 Acute prostatitis: Secondary | ICD-10-CM | POA: Diagnosis not present

## 2020-03-16 DIAGNOSIS — R8279 Other abnormal findings on microbiological examination of urine: Secondary | ICD-10-CM | POA: Diagnosis not present

## 2020-03-20 ENCOUNTER — Emergency Department (HOSPITAL_COMMUNITY)
Admission: EM | Admit: 2020-03-20 | Discharge: 2020-03-20 | Disposition: A | Discharge status: Home / Self Care | Payer: Medicare HMO | Attending: Emergency Medicine | Admitting: Emergency Medicine

## 2020-03-20 ENCOUNTER — Emergency Department (HOSPITAL_COMMUNITY): Payer: Medicare HMO

## 2020-03-20 ENCOUNTER — Ambulatory Visit (HOSPITAL_COMMUNITY)
Admission: EM | Admit: 2020-03-20 | Discharge: 2020-03-20 | Disposition: A | Discharge status: Home / Self Care | Payer: Medicare HMO

## 2020-03-20 ENCOUNTER — Other Ambulatory Visit: Payer: Self-pay

## 2020-03-20 ENCOUNTER — Encounter (HOSPITAL_COMMUNITY): Payer: Self-pay

## 2020-03-20 DIAGNOSIS — Z87891 Personal history of nicotine dependence: Secondary | ICD-10-CM | POA: Insufficient documentation

## 2020-03-20 DIAGNOSIS — E785 Hyperlipidemia, unspecified: Secondary | ICD-10-CM | POA: Diagnosis not present

## 2020-03-20 DIAGNOSIS — Z7982 Long term (current) use of aspirin: Secondary | ICD-10-CM | POA: Diagnosis not present

## 2020-03-20 DIAGNOSIS — G9340 Encephalopathy, unspecified: Secondary | ICD-10-CM | POA: Diagnosis not present

## 2020-03-20 DIAGNOSIS — Z794 Long term (current) use of insulin: Secondary | ICD-10-CM | POA: Insufficient documentation

## 2020-03-20 DIAGNOSIS — E1169 Type 2 diabetes mellitus with other specified complication: Secondary | ICD-10-CM | POA: Diagnosis not present

## 2020-03-20 DIAGNOSIS — R410 Disorientation, unspecified: Secondary | ICD-10-CM | POA: Diagnosis not present

## 2020-03-20 DIAGNOSIS — Z79899 Other long term (current) drug therapy: Secondary | ICD-10-CM | POA: Insufficient documentation

## 2020-03-20 DIAGNOSIS — G4700 Insomnia, unspecified: Secondary | ICD-10-CM

## 2020-03-20 DIAGNOSIS — I10 Essential (primary) hypertension: Secondary | ICD-10-CM | POA: Diagnosis not present

## 2020-03-20 DIAGNOSIS — Z7984 Long term (current) use of oral hypoglycemic drugs: Secondary | ICD-10-CM | POA: Diagnosis not present

## 2020-03-20 LAB — CBC WITH DIFFERENTIAL/PLATELET
Abs Immature Granulocytes: 0.26 10*3/uL — ABNORMAL HIGH (ref 0.00–0.07)
Basophils Absolute: 0.1 10*3/uL (ref 0.0–0.1)
Basophils Relative: 1 %
Eosinophils Absolute: 0 10*3/uL (ref 0.0–0.5)
Eosinophils Relative: 0 %
HCT: 48.4 % (ref 39.0–52.0)
Hemoglobin: 16.1 g/dL (ref 13.0–17.0)
Immature Granulocytes: 3 %
Lymphocytes Relative: 18 %
Lymphs Abs: 1.8 10*3/uL (ref 0.7–4.0)
MCH: 29.8 pg (ref 26.0–34.0)
MCHC: 33.3 g/dL (ref 30.0–36.0)
MCV: 89.5 fL (ref 80.0–100.0)
Monocytes Absolute: 0.9 10*3/uL (ref 0.1–1.0)
Monocytes Relative: 9 %
Neutro Abs: 6.9 10*3/uL (ref 1.7–7.7)
Neutrophils Relative %: 69 %
Platelets: 440 10*3/uL — ABNORMAL HIGH (ref 150–400)
RBC: 5.41 MIL/uL (ref 4.22–5.81)
RDW: 13.6 % (ref 11.5–15.5)
WBC: 9.9 10*3/uL (ref 4.0–10.5)
nRBC: 0 % (ref 0.0–0.2)

## 2020-03-20 LAB — COMPREHENSIVE METABOLIC PANEL
ALT: 26 U/L (ref 0–44)
AST: 26 U/L (ref 15–41)
Albumin: 4.1 g/dL (ref 3.5–5.0)
Alkaline Phosphatase: 84 U/L (ref 38–126)
Anion gap: 16 — ABNORMAL HIGH (ref 5–15)
BUN: 23 mg/dL (ref 8–23)
CO2: 18 mmol/L — ABNORMAL LOW (ref 22–32)
Calcium: 9.9 mg/dL (ref 8.9–10.3)
Chloride: 98 mmol/L (ref 98–111)
Creatinine, Ser: 1.35 mg/dL — ABNORMAL HIGH (ref 0.61–1.24)
GFR, Estimated: 58 mL/min — ABNORMAL LOW (ref >60)
Glucose, Bld: 142 mg/dL — ABNORMAL HIGH (ref 70–99)
Potassium: 4.2 mmol/L (ref 3.5–5.1)
Sodium: 132 mmol/L — ABNORMAL LOW (ref 135–145)
Total Bilirubin: 1.3 mg/dL — ABNORMAL HIGH (ref 0.3–1.2)
Total Protein: 7.9 g/dL (ref 6.5–8.1)

## 2020-03-20 LAB — URINALYSIS, ROUTINE W REFLEX MICROSCOPIC
Bilirubin Urine: NEGATIVE
Glucose, UA: >=500 mg/dL — AB
Ketones, ur: 20 mg/dL — AB
Nitrite: NEGATIVE
Protein, ur: 100 mg/dL — AB
RBC / HPF: >50 RBC/hpf — ABNORMAL HIGH (ref 0–5)
Specific Gravity, Urine: 1.03 (ref 1.005–1.030)
WBC, UA: >50 WBC/hpf — ABNORMAL HIGH (ref 0–5)
pH: 5 (ref 5.0–8.0)

## 2020-03-20 MED ORDER — HYDROXYZINE HCL 25 MG PO TABS: 50.0000 mg | ORAL_TABLET | Freq: Every evening | ORAL | 0 refills | Status: DC | PRN

## 2020-03-20 MED ORDER — DOXYCYCLINE HYCLATE 100 MG PO CAPS: 100.0000 mg | ORAL_CAPSULE | Freq: Two times a day (BID) | ORAL | 0 refills | Status: DC

## 2020-03-20 NOTE — ED Triage Notes (Signed)
Pt accompanied by wife who reports for the past 3 days pt has not been acting himself. Pt having episodes of confusion, anxiety and insomnia. Pt had recent procedure two weeks ago on his prostate and was treated for a UTI. Pt alert, uncooperative in triage, wanting to leave

## 2020-03-20 NOTE — ED Notes (Signed)
Pt refusing vital signs

## 2020-03-20 NOTE — ED Provider Notes (Signed)
[facility] EMERGENCY DEPARTMENT Provider Note   CSN: [id number] Arrival date & time: 03/20/20  1322     History Chief Complaint  Patient presents with  . Altered Mental Status    Austin Norris is a 64 y.o. male.  The history is provided by the spouse, a relative and the patient.  Altered Mental Status Presenting symptoms: behavior changes and confusion   Severity:  Moderate Most recent episode:  More than 2 days ago Episode history:  Multiple Timing:  Intermittent Progression:  Waxing and waning Chronicity:  New Context comment:  Recent prostate procedure and required antibiotics for infection afterwards Associated symptoms: no abdominal pain, no bladder incontinence, no decreased appetite, no difficulty breathing, no fever, no hallucinations, no headaches, no vomiting and no weakness   Associated symptoms comment:  Insomnia for the last few days, intermittent mild confusion, very emotional and crying a lot and then talking all the time      Past Medical History:  Diagnosis Date  . Diabetes mellitus   . H/O epistaxis   . History of stomach ulcers    30 YRS AGO  . Hypercholesteremia   . Hypertension   . Shoulder pain, left   . Umbilical hernia     Patient Active Problem List   Diagnosis Date Noted  . Type II or unspecified type diabetes mellitus without mention of complication, uncontrolled 12/10/2012  . Pure hypercholesterolemia 12/10/2012  . Unspecified essential hypertension 12/10/2012    Past Surgical History:  Procedure Laterality Date  . CARDIAC CATHETERIZATION  1997  . COLONOSCOPY    . UMBILICAL HERNIA REPAIR N/A 07/11/2015   Procedure: LAPAROSCOPIC ASSISTED OPEN UMBILICAL HERNIA;  Surgeon: Johnathan Hausen, MD;  Location: [facility];  Service: General;  Laterality: N/A;  With MESH       Family History  Problem Relation Age of Onset  . Heart disease Mother   . Diabetes Father   . Diabetes Sister     Social History   Tobacco  Use  . Smoking status: Former Smoker    Quit date: 05/21/1997    Years since quitting: 22.8  . Smokeless tobacco: Never Used  Substance Use Topics  . Alcohol use: No  . Drug use: No    Home Medications Prior to Admission medications   Medication Sig Start Date End Date Taking? Authorizing Provider  aspirin 81 MG chewable tablet Chew 1 tablet (81 mg total) by mouth daily. 03/05/19  Yes Ward, Kristen N, DO  atorvastatin (LIPITOR) 40 MG tablet TAKE 1 TABLET BY MOUTH EVERY DAY Patient taking differently: Take 40 mg by mouth daily.  03/15/20  Yes Elayne Snare, MD  DILT-XR 240 MG 24 hr capsule TAKE 1 CAPSULE BY MOUTH EVERY DAY Patient taking differently: Take 240 mg by mouth daily.  12/20/19  Yes Elayne Snare, MD  gabapentin (NEURONTIN) 100 MG capsule TAKE 1 CAPSULE 3 TIMES DAILY AS NEEDED WITH FOOD FOR SYMPTOM RELIEF Patient taking differently: Take 100 mg by mouth 3 (three) times daily.  02/20/20  Yes Elayne Snare, MD  insulin aspart (NOVOLOG FLEXPEN) 100 UNIT/ML FlexPen Inject 35 Units into the skin in the morning, at noon, and at bedtime. 08/18/19  Yes Elayne Snare, MD  INVOKANA 300 MG TABS tablet TAKE 1 TABLET BY MOUTH EVERY DAY BEFORE BREAKFAST Patient taking differently: Take 300 mg by mouth daily before breakfast.  03/14/20  Yes Elayne Snare, MD  levofloxacin (LEVAQUIN) 500 MG tablet Take 500 mg by mouth daily. 03/06/20  Yes  [provider]  metFORMIN (GLUCOPHAGE-XR) 500 MG 24 hr tablet TAKE 1 TABLET BY MOUTH IN THE MORNING AND 2 TABLETS IN THE EVENING Patient taking differently: Take 1,000 mg by mouth daily after supper.  01/19/20  Yes Elayne Snare, MD  olmesartan (BENICAR) 40 MG tablet TAKE 1 TABLET daily, call to schedule follow up Patient taking differently: Take 40 mg by mouth daily.  03/17/20  Yes Elayne Snare, MD  pantoprazole (PROTONIX) 40 MG tablet Take 40 mg by mouth daily.  02/13/20 05/13/20 Yes [provider]  phenazopyridine (PYRIDIUM) 200 MG tablet Take 200 mg by  mouth 3 (three) times daily as needed (urinary pain).  03/18/20  Yes [provider]  spironolactone-hydrochlorothiazide (ALDACTAZIDE) 25-25 MG tablet TAKE 1 TABLET BY MOUTH EVERY DAY Patient taking differently: Take 1 tablet by mouth daily.  12/20/19  Yes Elayne Snare, MD  TRESIBA FLEXTOUCH 200 UNIT/ML FlexTouch Pen INJECT 44 UNITS INTO SKIN DAILY Patient taking differently: Inject 44 Units into the skin at bedtime.  02/12/20  Yes Elayne Snare, MD  Continuous Blood Gluc Sensor (FREESTYLE LIBRE 14 DAY SENSOR) MISC USE EVERY 14 DAYS 03/12/20   Elayne Snare, MD  glucose blood (CONTOUR NEXT TEST) test strip USE TO CHECK BLOOD SUGAR TWICE A DAY 11/23/19   Elayne Snare, MD  Insulin Syringe-Needle U-100 (INSULIN SYRINGE 1CC/31GX5/16") 31G X 5/16" 1 ML MISC Use to inject lantus daily 01/16/16   Elayne Snare, MD  MICROLET LANCETS MISC Use to check blood sugar 2 times a day 01/16/16   Elayne Snare, MD    Allergies    Patient has no known allergies.  Review of Systems   Review of Systems  Constitutional: Negative for decreased appetite and fever.  Gastrointestinal: Negative for abdominal pain and vomiting.  Genitourinary: Negative for bladder incontinence.  Neurological: Negative for weakness and headaches.  Psychiatric/Behavioral: Positive for confusion. Negative for hallucinations.  All other systems reviewed and are negative.   Physical Exam Updated Vital Signs BP (!) 162/75 (BP Location: Right Arm)   Pulse (!) 105   Temp 99.1 F (37.3 C) (Oral)   Resp 18   Ht 5\' 3"  (1.6 m)   Wt 84.4 kg   SpO2 97%   BMI 32.95 kg/m   Physical Exam Vitals and nursing note reviewed.  Constitutional:      General: He is not in acute distress.    Appearance: Normal appearance. He is well-developed and normal weight.  HENT:     Head: Normocephalic and atraumatic.  Eyes:     Conjunctiva/sclera: Conjunctivae normal.     Pupils: Pupils are equal, round, and reactive to light.  Cardiovascular:     Rate  and Rhythm: Normal rate and regular rhythm.     Heart sounds: No murmur heard.   Pulmonary:     Effort: Pulmonary effort is normal. No respiratory distress.     Breath sounds: Normal breath sounds. No wheezing or rales.  Abdominal:     General: There is no distension.     Palpations: Abdomen is soft.     Tenderness: There is no abdominal tenderness. There is no guarding or rebound.  Musculoskeletal:        General: No tenderness. Normal range of motion.     Cervical back: Normal range of motion and neck supple.     Right lower leg: No edema.     Left lower leg: No edema.  Skin:    General: Skin is warm and dry.  Capillary Refill: Capillary refill takes less than 2 seconds.     Findings: No erythema or rash.  Neurological:     Mental Status: He is alert and oriented to person, place, and time. Mental status is at baseline.     Cranial Nerves: No cranial nerve deficit.     Sensory: No sensory deficit.     Motor: No weakness.     Coordination: Coordination normal.  Psychiatric:     Comments: Calm and cooperative at this time     ED Results / Procedures / Treatments   Labs (all labs ordered are listed, but only abnormal results are displayed) Labs Reviewed  CBC WITH DIFFERENTIAL/PLATELET - Abnormal; Notable for the following components:      Result Value   Platelets 440 (*)    Abs Immature Granulocytes 0.26 (*)    All other components within normal limits  COMPREHENSIVE METABOLIC PANEL - Abnormal; Notable for the following components:   Sodium 132 (*)    CO2 18 (*)    Glucose, Bld 142 (*)    Creatinine, Ser 1.35 (*)    Total Bilirubin 1.3 (*)    GFR, Estimated 58 (*)    Anion gap 16 (*)    All other components within normal limits  URINALYSIS, ROUTINE W REFLEX MICROSCOPIC - Abnormal; Notable for the following components:   APPearance CLOUDY (*)    Glucose, UA >=500 (*)    Hgb urine dipstick LARGE (*)    Ketones, ur 20 (*)    Protein, ur 100 (*)    Leukocytes,Ua  LARGE (*)    RBC / HPF >50 (*)    WBC, UA >50 (*)    Bacteria, UA FEW (*)    All other components within normal limits  URINE CULTURE    EKG None  Radiology CT Head Wo Contrast  Result Date: 03/20/2020 CLINICAL DATA:  Encephalopathy EXAM: CT HEAD WITHOUT CONTRAST TECHNIQUE: Contiguous axial images were obtained from the base of the skull through the vertex without intravenous contrast. COMPARISON:  03/04/2019 FINDINGS: Brain: There is no mass, hemorrhage or extra-axial collection. The size and configuration of the ventricles and extra-axial CSF spaces are normal. The brain parenchyma is normal, without acute or chronic infarction. Vascular: No abnormal hyperdensity of the major intracranial arteries or dural venous sinuses. No intracranial atherosclerosis. Skull: The visualized skull base, calvarium and extracranial soft tissues are normal. Sinuses/Orbits: No fluid levels or advanced mucosal thickening of the visualized paranasal sinuses. No mastoid or middle ear effusion. The orbits are normal. IMPRESSION: Normal head CT. Electronically Signed   By: Ulyses Jarred M.D.   Norris: 03/20/2020 20:36    Procedures Procedures (including critical care time)  Medications Ordered in ED Medications - No data to display  ED Course  I have reviewed the triage vital signs and the nursing notes.  Pertinent labs & imaging results that were available during my care of the patient were reviewed by me and considered in my medical decision making (see chart for details).    MDM Rules/Calculators/A&P                          Elderly male with a history of diabetes, hypertension, prostate enlargement with recent procedure where prostate tissue was removed.  Spoke with urology and it appears that after the procedures in early November patient returned Norris the 17th due to dysuria, frequency and urgency.  He was also running fever and at that  time he was given gentamicin in the office and given a prescription  for levofloxacin due to concern for possible prostatitis.  Wife does report that he was taking that medication prior to the procedure as well.  He returned to the office Norris the 19th and symptoms were improving so they wanted him to continue the Levaquin for the next 2 weeks and follow-up at the office or call for any concerns.  Wife reports for the last 4 to 5 days patient has been highly emotional and not acting himself.  He has not been sleeping well and they were not sure what was going Norris.  He has been eating and drinking okay.  He has had no further abdominal pain, dysuria or frequency.  His urine culture from the 17th grew E. coli that was pansensitive except to ampicillin and Bactrim.  Concern for possible ongoing infection versus antibiotic reaction versus insomnia as the cause of his emotional state.  Patient does have a prior history of depression but wife states that was years ago and it improved Norris its own and he never required any medication.  Today he continues to tell his wife that he does not want to be here and he like to go home but he denies any abdominal pain, vomiting, dysuria or hematuria when asked.  No focal symptoms concerning for stroke at this time.  Will recheck blood work to ensure no AKI today or ongoing signs of infection.  When speaking with Dr. Al Pimple with urology she recommended the antibiotic seems to be the problem and she would recommend changing to a different agent but not stopping antibiotics at this time.  10:46 PM Patient for a prolonged period of time refused blood work.  However when his brother arrived he was able to convince him to get blood work.  Patient's renal function has not significantly changed.  Creatinine of 1.35.  Blood sugar of 142.  CO2 is mildly decreased with an anion gap of 16.  UA with 20 ketones and greater than 50 white cells, red cells with only few bacteria.  This could be from recent surgery but also concern for possible prostatitis.  Patient has  no abdominal pain or concerns for abscess at this time.  Head CT is negative.  CBC within normal limits.  Discussed with the patient and his family members that his symptoms could be from [place] and those side effects however also could be from not sleeping well for the last 3 to 4 days.  His family member requested medication to help him sleep and he was given a short course of hydroxyzine.  Also given a prescription for doxycycline which he was told to switch if getting a good night sleep does not improve his symptoms.  However patient is awake alert and speaking with his family members.  He is ambulating with no difficulty.  Because his primary language is not [place] it is difficult to discern if he is confused but he seems awake and alert in the [place] he is speaking is appropriate.  His wife was given strict return precautions.  All the medications were discussed with him.  They were encouraged to call urology if symptoms were not improving.  MDM Number of Diagnoses or Management Options   Amount and/or Complexity of Data Reviewed Clinical lab tests: ordered and reviewed Tests in the radiology section of CPT: reviewed and ordered Tests in the medicine section of CPT: ordered and reviewed Decide to obtain previous medical records or to  obtain history from someone other than the patient: yes Obtain history from someone other than the patient: yes Review and summarize past medical records: yes Discuss the patient with other providers: yes Independent visualization of images, tracings, or specimens: yes  Risk of Complications, Morbidity, and/or Mortality Presenting problems: moderate Diagnostic procedures: low Management options: low  Patient Progress Patient progress: stable    Final Clinical Impression(s) / ED Diagnoses Final diagnoses:  Confusion  Insomnia, unspecified type    Rx / DC Orders ED Discharge Orders         Ordered    hydrOXYzine (ATARAX/VISTARIL) 25 MG tablet   At bedtime PRN        03/20/20 2212    doxycycline (VIBRAMYCIN) 100 MG capsule  2 times daily        03/20/20 2212           Blanchie Dessert, MD 03/20/20 2249

## 2020-03-20 NOTE — Discharge Instructions (Signed)
Kidney function, white blood cell count and CAT scan of the head all look normal today.  Make sure he continues to drink plenty of fluids.  If he starts having fever, vomiting, abdominal pain, painful urinating, inability to walk please return to the emergency room.  Try the medicine for sleep to see if getting a good night sleep will help with his confusion.  However if it does not seem to be getting better stop the antibiotic levofloxacin and start taking the new antibiotic doxycycline

## 2020-03-20 NOTE — ED Notes (Signed)
Pt refusing blood work, Maryan Rued MD aware.

## 2020-03-21 ENCOUNTER — Other Ambulatory Visit: Payer: Self-pay

## 2020-03-21 ENCOUNTER — Encounter (HOSPITAL_COMMUNITY): Payer: Self-pay

## 2020-03-21 ENCOUNTER — Inpatient Hospital Stay (HOSPITAL_COMMUNITY)
Admission: EM | Admit: 2020-03-21 | Discharge: 2020-03-23 | DRG: 071 | Disposition: A | Discharge status: Home / Self Care | Payer: 59 | Attending: Internal Medicine | Admitting: Internal Medicine

## 2020-03-21 DIAGNOSIS — Z87891 Personal history of nicotine dependence: Secondary | ICD-10-CM | POA: Diagnosis not present

## 2020-03-21 DIAGNOSIS — K219 Gastro-esophageal reflux disease without esophagitis: Secondary | ICD-10-CM | POA: Diagnosis present

## 2020-03-21 DIAGNOSIS — T368X5A Adverse effect of other systemic antibiotics, initial encounter: Secondary | ICD-10-CM | POA: Diagnosis present

## 2020-03-21 DIAGNOSIS — Z79899 Other long term (current) drug therapy: Secondary | ICD-10-CM | POA: Diagnosis not present

## 2020-03-21 DIAGNOSIS — G9341 Metabolic encephalopathy: Principal | ICD-10-CM | POA: Diagnosis present

## 2020-03-21 DIAGNOSIS — G4700 Insomnia, unspecified: Secondary | ICD-10-CM | POA: Diagnosis present

## 2020-03-21 DIAGNOSIS — Z7984 Long term (current) use of oral hypoglycemic drugs: Secondary | ICD-10-CM

## 2020-03-21 DIAGNOSIS — R410 Disorientation, unspecified: Secondary | ICD-10-CM | POA: Diagnosis not present

## 2020-03-21 DIAGNOSIS — B9620 Unspecified Escherichia coli [E. coli] as the cause of diseases classified elsewhere: Secondary | ICD-10-CM | POA: Diagnosis not present

## 2020-03-21 DIAGNOSIS — N3090 Cystitis, unspecified without hematuria: Secondary | ICD-10-CM | POA: Diagnosis present

## 2020-03-21 DIAGNOSIS — I10 Essential (primary) hypertension: Secondary | ICD-10-CM | POA: Diagnosis present

## 2020-03-21 DIAGNOSIS — E119 Type 2 diabetes mellitus without complications: Secondary | ICD-10-CM | POA: Diagnosis present

## 2020-03-21 DIAGNOSIS — D75839 Thrombocytosis, unspecified: Secondary | ICD-10-CM | POA: Diagnosis present

## 2020-03-21 DIAGNOSIS — Z7982 Long term (current) use of aspirin: Secondary | ICD-10-CM

## 2020-03-21 DIAGNOSIS — Z833 Family history of diabetes mellitus: Secondary | ICD-10-CM | POA: Diagnosis not present

## 2020-03-21 DIAGNOSIS — Z20822 Contact with and (suspected) exposure to covid-19: Secondary | ICD-10-CM | POA: Diagnosis present

## 2020-03-21 DIAGNOSIS — E785 Hyperlipidemia, unspecified: Secondary | ICD-10-CM | POA: Diagnosis present

## 2020-03-21 DIAGNOSIS — E871 Hypo-osmolality and hyponatremia: Secondary | ICD-10-CM | POA: Diagnosis not present

## 2020-03-21 DIAGNOSIS — N419 Inflammatory disease of prostate, unspecified: Secondary | ICD-10-CM | POA: Diagnosis not present

## 2020-03-21 DIAGNOSIS — Z794 Long term (current) use of insulin: Secondary | ICD-10-CM | POA: Diagnosis not present

## 2020-03-21 MED ORDER — ZIPRASIDONE MESYLATE 20 MG IM SOLR
10.0000 mg | Freq: Once | INTRAMUSCULAR | Status: AC
  Administered 2020-03-21: 10 mg via INTRAMUSCULAR
  Filled 2020-03-21: qty 20

## 2020-03-21 NOTE — ED Notes (Signed)
Daughter and niece requesting updates, this NT explained that information will be explained to the wife that is with the pt and they should communicate with her regarding the pts care.

## 2020-03-21 NOTE — ED Provider Notes (Signed)
Emergency Department Provider Note   I have reviewed the triage vital signs and the nursing notes.   HISTORY  Chief Complaint Altered Mental Status   HPI Austin Norris is a 64 y.o. male with PMH reviewed below including recent prostate surgery with resulting E. Coli UTI started on Levaquin on the 17th with UTI symptoms at that time presents to the ED now with worsening AMS, agitation, and mild aggression. Wife at bedside provides most of the history. Patient was in the ED yesterday with similar but by report was much more coherent and non-violent. W/u at that time showed possible residual UTI. Patient and family did not want admission and returned home with Doxycycline and medication for anxiety/sleep. Wife reports a brief "mania" episode in the 1980s but that resolved without medication. No psychiatric episodes since.   Patient is agitated and gently shoving wife and staff away. He is repeating phrases used by his wife and unable to provide significant history. Level 5 caveat: AMS.   Past Medical History:  Diagnosis Date  . Diabetes mellitus   . H/O epistaxis   . History of stomach ulcers    30 YRS AGO  . Hypercholesteremia   . Hypertension   . Shoulder pain, left   . Umbilical hernia     Patient Active Problem List   Diagnosis Date Noted  . Delirium   . Acute metabolic encephalopathy 16/01/9603  . Type II or unspecified type diabetes mellitus without mention of complication, uncontrolled 12/10/2012  . Pure hypercholesterolemia 12/10/2012  . Unspecified essential hypertension 12/10/2012    Past Surgical History:  Procedure Laterality Date  . CARDIAC CATHETERIZATION  1997  . COLONOSCOPY    . UMBILICAL HERNIA REPAIR N/A 07/11/2015   Procedure: LAPAROSCOPIC ASSISTED OPEN UMBILICAL HERNIA;  Surgeon: Johnathan Hausen, MD;  Location: [facility];  Service: General;  Laterality: N/A;  With MESH    Allergies Patient has no known allergies.  Family History  Problem Relation  Age of Onset  . Heart disease Mother   . Diabetes Father   . Diabetes Sister     Social History Social History   Tobacco Use  . Smoking status: Former Smoker    Quit date: 05/21/1997    Years since quitting: 22.8  . Smokeless tobacco: Never Used  Substance Use Topics  . Alcohol use: No  . Drug use: No    Review of Systems  Level 5 caveat: AMS  ____________________________________________   PHYSICAL EXAM:  VITAL SIGNS: ED Triage Vitals [03/21/20 2228]  Enc Vitals Group     BP 133/75     Pulse Rate 90     Resp 18     Temp      Temp src      SpO2 100 %   Constitutional: Alert with pressured speech and confusion.  Eyes: Conjunctivae are normal. Head: Atraumatic. Nose: No congestion/rhinnorhea. Mouth/Throat: Mucous membranes are moist.  Neck: No stridor.  Cardiovascular: Normal rate, regular rhythm. Good peripheral circulation. Grossly normal heart sounds.   Respiratory: Normal respiratory effort.  No retractions. Lungs CTAB. Gastrointestinal: Soft and nontender. No distention.  Musculoskeletal: No gross deformities of extremities. Neurologic: Confusion but no dysarthria. No gross focal neurologic deficits are appreciated.  Skin:  Skin is warm, dry and intact. No rash noted. Psychiatric: Speech is pressured with mild agitation.   ____________________________________________   LABS (all labs ordered are listed, but only abnormal results are displayed)  Labs Reviewed  COMPREHENSIVE METABOLIC PANEL - Abnormal; Notable for  the following components:      Result Value   Sodium 131 (*)    Chloride 97 (*)    Glucose, Bld 172 (*)    BUN 24 (*)    All other components within normal limits  CBC WITH DIFFERENTIAL/PLATELET - Abnormal; Notable for the following components:   Platelets 435 (*)    Abs Immature Granulocytes 0.25 (*)    All other components within normal limits  URINALYSIS, ROUTINE W REFLEX MICROSCOPIC - Abnormal; Notable for the following components:    APPearance CLOUDY (*)    Glucose, UA >=500 (*)    Hgb urine dipstick LARGE (*)    Protein, ur 30 (*)    Leukocytes,Ua MODERATE (*)    RBC / HPF >50 (*)    WBC, UA >50 (*)    Bacteria, UA FEW (*)    All other components within normal limits  GLUCOSE, CAPILLARY - Abnormal; Notable for the following components:   Glucose-Capillary 150 (*)    All other components within normal limits  GLUCOSE, CAPILLARY - Abnormal; Notable for the following components:   Glucose-Capillary 168 (*)    All other components within normal limits  GLUCOSE, CAPILLARY - Abnormal; Notable for the following components:   Glucose-Capillary 150 (*)    All other components within normal limits  GLUCOSE, CAPILLARY - Abnormal; Notable for the following components:   Glucose-Capillary 121 (*)    All other components within normal limits  GLUCOSE, CAPILLARY - Abnormal; Notable for the following components:   Glucose-Capillary 129 (*)    All other components within normal limits  HEMOGLOBIN A1C - Abnormal; Notable for the following components:   Hgb A1c MFr Bld 7.3 (*)    All other components within normal limits  BASIC METABOLIC PANEL - Abnormal; Notable for the following components:   Sodium 132 (*)    Chloride 97 (*)    CO2 20 (*)    BUN 28 (*)    All other components within normal limits  GLUCOSE, CAPILLARY - Abnormal; Notable for the following components:   Glucose-Capillary 245 (*)    All other components within normal limits  GLUCOSE, CAPILLARY - Abnormal; Notable for the following components:   Glucose-Capillary 115 (*)    All other components within normal limits  RESP PANEL BY RT-PCR (FLU A&B, COVID) ARPGX2  CULTURE, BLOOD (ROUTINE X 2)  CULTURE, BLOOD (ROUTINE X 2)  TSH  RAPID URINE DRUG SCREEN, HOSP PERFORMED  ETHANOL  AMMONIA  HIV ANTIBODY (ROUTINE TESTING W REFLEX)  GLUCOSE, CAPILLARY  CBG MONITORING, ED    ____________________________________________  EKG  None ____________________________________________  RADIOLOGY  CT head reviewed from yesterday.  ____________________________________________   PROCEDURES  Procedure(s) performed:   Procedures  CRITICAL CARE Performed by: Margette Fast Total critical care time: 35 minutes Critical care time was exclusive of separately billable procedures and treating other patients. Critical care was necessary to treat or prevent imminent or life-threatening deterioration. Critical care was time spent personally by me on the following activities: development of treatment plan with patient and/or surrogate as well as nursing, discussions with consultants, evaluation of patient's response to treatment, examination of patient, obtaining history from patient or surrogate, ordering and performing treatments and interventions, ordering and review of laboratory studies, ordering and review of radiographic studies, pulse oximetry and re-evaluation of patient's condition.  Nanda Quinton, MD Emergency Medicine  ____________________________________________   INITIAL IMPRESSION / ASSESSMENT AND PLAN / ED COURSE  Pertinent labs & imaging results that  were available during my care of the patient were reviewed by me and considered in my medical decision making (see chart for details).   Presents to the emergency department for evaluation of worsening altered mental status with some new combative type behavior.  Yesterday the patient was politely refusing work-up but not altered to the point of confusion and combative behavior.  He returns in his state.  He does not have capacity at this time to refuse additional work-up and treatment.  We will give Geodon here to treat his symptoms and repeat blood work and urine test. Suspect medical etiology for symptoms rather than psychiatry pathology. Will given rocephin with UTI/Prostatitis as the presumed source.    Labs reviewed. No acute findings. Patient requiring geodon and ativan with agitation. Will admit with delirium.   Discussed patient's case with [facility] to request admission. Patient and family (if present) updated with plan. Care transferred to [facility] service.  I reviewed all nursing notes, vitals, pertinent old records, EKGs, labs, imaging (as available).  ____________________________________________  FINAL CLINICAL IMPRESSION(S) / ED DIAGNOSES  Final diagnoses:  Delirium     MEDICATIONS GIVEN DURING THIS VISIT:  Medications  ziprasidone (GEODON) injection 10 mg (10 mg Intramuscular Given 03/21/20 2348)  cefTRIAXone (ROCEPHIN) 1 g in sodium chloride 0.9 % 100 mL IVPB (0 g Intravenous Stopped 03/22/20 0340)  ziprasidone (GEODON) injection 10 mg (10 mg Intramuscular Given 03/22/20 0136)  LORazepam (ATIVAN) injection 2 mg (2 mg Intramuscular Given 03/22/20 0136)     NEW OUTPATIENT MEDICATIONS STARTED DURING THIS VISIT:  Discharge Medication List as of 03/23/2020  1:42 PM    START taking these medications   Details  spironolactone (ALDACTONE) 25 MG tablet Take 1 tablet (25 mg total) by mouth daily., Starting Sat 03/24/2020, Normal    traZODone (DESYREL) 50 MG tablet Take 1 tablet (50 mg total) by mouth at bedtime., Starting Fri 03/23/2020, Normal        Note:  This document was prepared using Dragon voice recognition software and may include unintentional dictation errors.  Nanda Quinton, MD, [facility] Emergency Medicine    Jalecia Leon, Wonda [facility], MD 03/27/20 1021
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
## 2020-03-21 NOTE — ED Notes (Signed)
Pt combative when placed in room. Pt allowed RN Sophie and this NT to hook pt up to BP cuff and pulse ox but refused EKG leads. Pt wife came back with pt and pt is also combative with wife frequently. Pt wife translating for pt at times. Pt also refused temperature and blood sugar check. Pt told this NT several times to "shoo" and "leave". Pt waving arms erratically and gets more combative and aggressive when getting close to him. Pt given ice water at request and with permission from [place].

## 2020-03-21 NOTE — ED Notes (Signed)
Unable to get temperature at this time. Pt was cooperative with all vital signs taken except for the temperature. This tech attempted multiple times to get a temp and the pt swung his hands around and said "no" many times.

## 2020-03-22 DIAGNOSIS — N419 Inflammatory disease of prostate, unspecified: Secondary | ICD-10-CM | POA: Diagnosis present

## 2020-03-22 DIAGNOSIS — B9620 Unspecified Escherichia coli [E. coli] as the cause of diseases classified elsewhere: Secondary | ICD-10-CM | POA: Diagnosis present

## 2020-03-22 DIAGNOSIS — Z79899 Other long term (current) drug therapy: Secondary | ICD-10-CM | POA: Diagnosis not present

## 2020-03-22 DIAGNOSIS — Z7982 Long term (current) use of aspirin: Secondary | ICD-10-CM | POA: Diagnosis not present

## 2020-03-22 DIAGNOSIS — Z20822 Contact with and (suspected) exposure to covid-19: Secondary | ICD-10-CM | POA: Diagnosis present

## 2020-03-22 DIAGNOSIS — G9341 Metabolic encephalopathy: Secondary | ICD-10-CM | POA: Diagnosis present

## 2020-03-22 DIAGNOSIS — D75839 Thrombocytosis, unspecified: Secondary | ICD-10-CM | POA: Diagnosis present

## 2020-03-22 DIAGNOSIS — Z87891 Personal history of nicotine dependence: Secondary | ICD-10-CM | POA: Diagnosis not present

## 2020-03-22 DIAGNOSIS — T368X5A Adverse effect of other systemic antibiotics, initial encounter: Secondary | ICD-10-CM | POA: Diagnosis present

## 2020-03-22 DIAGNOSIS — K219 Gastro-esophageal reflux disease without esophagitis: Secondary | ICD-10-CM | POA: Diagnosis present

## 2020-03-22 DIAGNOSIS — I10 Essential (primary) hypertension: Secondary | ICD-10-CM | POA: Diagnosis present

## 2020-03-22 DIAGNOSIS — G4700 Insomnia, unspecified: Secondary | ICD-10-CM | POA: Diagnosis present

## 2020-03-22 DIAGNOSIS — N3090 Cystitis, unspecified without hematuria: Secondary | ICD-10-CM | POA: Diagnosis present

## 2020-03-22 DIAGNOSIS — Z7984 Long term (current) use of oral hypoglycemic drugs: Secondary | ICD-10-CM | POA: Diagnosis not present

## 2020-03-22 DIAGNOSIS — R410 Disorientation, unspecified: Secondary | ICD-10-CM | POA: Diagnosis present

## 2020-03-22 DIAGNOSIS — E871 Hypo-osmolality and hyponatremia: Secondary | ICD-10-CM | POA: Diagnosis not present

## 2020-03-22 DIAGNOSIS — Z794 Long term (current) use of insulin: Secondary | ICD-10-CM | POA: Diagnosis not present

## 2020-03-22 DIAGNOSIS — Z833 Family history of diabetes mellitus: Secondary | ICD-10-CM | POA: Diagnosis not present

## 2020-03-22 DIAGNOSIS — E785 Hyperlipidemia, unspecified: Secondary | ICD-10-CM | POA: Diagnosis present

## 2020-03-22 DIAGNOSIS — E119 Type 2 diabetes mellitus without complications: Secondary | ICD-10-CM | POA: Diagnosis present

## 2020-03-22 LAB — GLUCOSE, CAPILLARY
Glucose-Capillary: 150 mg/dL — ABNORMAL HIGH (ref 70–99)
Glucose-Capillary: 150 mg/dL — ABNORMAL HIGH (ref 70–99)
Glucose-Capillary: 168 mg/dL — ABNORMAL HIGH (ref 70–99)
Glucose-Capillary: 121 mg/dL — ABNORMAL HIGH (ref 70–99)
Glucose-Capillary: 129 mg/dL — ABNORMAL HIGH (ref 70–99)
Glucose-Capillary: 245 mg/dL — ABNORMAL HIGH (ref 70–99)

## 2020-03-22 LAB — RESP PANEL BY RT-PCR (FLU A&B, COVID) ARPGX2
Influenza A by PCR: NEGATIVE
Influenza B by PCR: NEGATIVE
SARS Coronavirus 2 by RT PCR: NEGATIVE

## 2020-03-22 LAB — URINE CULTURE: Culture: <10000 — AB

## 2020-03-22 LAB — URINALYSIS, ROUTINE W REFLEX MICROSCOPIC
Bilirubin Urine: NEGATIVE
Glucose, UA: >=500 mg/dL — AB
Ketones, ur: NEGATIVE mg/dL
Nitrite: NEGATIVE
Protein, ur: 30 mg/dL — AB
RBC / HPF: >50 RBC/hpf — ABNORMAL HIGH (ref 0–5)
Specific Gravity, Urine: 1.027 (ref 1.005–1.030)
WBC, UA: >50 WBC/hpf — ABNORMAL HIGH (ref 0–5)
pH: 5 (ref 5.0–8.0)

## 2020-03-22 LAB — RAPID URINE DRUG SCREEN, HOSP PERFORMED
Amphetamines: NOT DETECTED
Barbiturates: NOT DETECTED
Benzodiazepines: NOT DETECTED
Cocaine: NOT DETECTED
Opiates: NOT DETECTED
Tetrahydrocannabinol: NOT DETECTED

## 2020-03-22 LAB — COMPREHENSIVE METABOLIC PANEL
ALT: 22 U/L (ref 0–44)
AST: 22 U/L (ref 15–41)
Albumin: 3.5 g/dL (ref 3.5–5.0)
Alkaline Phosphatase: 79 U/L (ref 38–126)
Anion gap: 11 (ref 5–15)
BUN: 24 mg/dL — ABNORMAL HIGH (ref 8–23)
CO2: 23 mmol/L (ref 22–32)
Calcium: 9.2 mg/dL (ref 8.9–10.3)
Chloride: 97 mmol/L — ABNORMAL LOW (ref 98–111)
Creatinine, Ser: 1.12 mg/dL (ref 0.61–1.24)
GFR, Estimated: >60 mL/min (ref >60)
Glucose, Bld: 172 mg/dL — ABNORMAL HIGH (ref 70–99)
Potassium: 3.7 mmol/L (ref 3.5–5.1)
Sodium: 131 mmol/L — ABNORMAL LOW (ref 135–145)
Total Bilirubin: 0.6 mg/dL (ref 0.3–1.2)
Total Protein: 6.8 g/dL (ref 6.5–8.1)

## 2020-03-22 LAB — CBC WITH DIFFERENTIAL/PLATELET
Abs Immature Granulocytes: 0.25 10*3/uL — ABNORMAL HIGH (ref 0.00–0.07)
Basophils Absolute: 0.1 10*3/uL (ref 0.0–0.1)
Basophils Relative: 1 %
Eosinophils Absolute: 0.1 10*3/uL (ref 0.0–0.5)
Eosinophils Relative: 1 %
HCT: 45.8 % (ref 39.0–52.0)
Hemoglobin: 14.9 g/dL (ref 13.0–17.0)
Immature Granulocytes: 3 %
Lymphocytes Relative: 26 %
Lymphs Abs: 2.1 10*3/uL (ref 0.7–4.0)
MCH: 28.8 pg (ref 26.0–34.0)
MCHC: 32.5 g/dL (ref 30.0–36.0)
MCV: 88.4 fL (ref 80.0–100.0)
Monocytes Absolute: 0.8 10*3/uL (ref 0.1–1.0)
Monocytes Relative: 10 %
Neutro Abs: 4.9 10*3/uL (ref 1.7–7.7)
Neutrophils Relative %: 59 %
Platelets: 435 10*3/uL — ABNORMAL HIGH (ref 150–400)
RBC: 5.18 MIL/uL (ref 4.22–5.81)
RDW: 13.1 % (ref 11.5–15.5)
WBC: 8.2 10*3/uL (ref 4.0–10.5)
nRBC: 0 % (ref 0.0–0.2)

## 2020-03-22 LAB — AMMONIA: Ammonia: 27 umol/L (ref 9–35)

## 2020-03-22 LAB — TSH: TSH: 0.709 u[IU]/mL (ref 0.350–4.500)

## 2020-03-22 LAB — HIV ANTIBODY (ROUTINE TESTING W REFLEX): HIV Screen 4th Generation wRfx: NONREACTIVE

## 2020-03-22 LAB — ETHANOL: Alcohol, Ethyl (B): <10 mg/dL (ref <10)

## 2020-03-22 MED ORDER — ZIPRASIDONE MESYLATE 20 MG IM SOLR
10.0000 mg | Freq: Once | INTRAMUSCULAR | Status: AC
  Administered 2020-03-22: 10 mg via INTRAMUSCULAR
  Filled 2020-03-22: qty 20

## 2020-03-22 MED ORDER — POLYETHYLENE GLYCOL 3350 17 G PO PACK: 17.0000 g | PACK | Freq: Every day | ORAL | Status: DC | PRN

## 2020-03-22 MED ORDER — ACETAMINOPHEN 325 MG PO TABS: 650.0000 mg | ORAL_TABLET | Freq: Four times a day (QID) | ORAL | Status: DC | PRN

## 2020-03-22 MED ORDER — ONDANSETRON HCL 4 MG PO TABS: 4.0000 mg | ORAL_TABLET | Freq: Four times a day (QID) | ORAL | Status: DC | PRN

## 2020-03-22 MED ORDER — SODIUM CHLORIDE 0.9 % IV SOLN
2.0000 g | INTRAVENOUS | Status: DC
  Administered 2020-03-22: 2 g via INTRAVENOUS
  Filled 2020-03-22: qty 20

## 2020-03-22 MED ORDER — HYDROCHLOROTHIAZIDE 25 MG PO TABS
25.0000 mg | ORAL_TABLET | Freq: Every day | ORAL | Status: DC
  Administered 2020-03-22: 25 mg via ORAL
  Filled 2020-03-22: qty 1

## 2020-03-22 MED ORDER — LORAZEPAM 2 MG/ML IJ SOLN
2.0000 mg | Freq: Once | INTRAMUSCULAR | Status: AC
  Administered 2020-03-22: 2 mg via INTRAMUSCULAR
  Filled 2020-03-22: qty 1

## 2020-03-22 MED ORDER — ASPIRIN 81 MG PO CHEW
81.0000 mg | CHEWABLE_TABLET | Freq: Every day | ORAL | Status: DC
  Administered 2020-03-22 – 2020-03-23 (×2): 81 mg via ORAL
  Filled 2020-03-22: qty 1

## 2020-03-22 MED ORDER — HEPARIN SODIUM (PORCINE) 5000 UNIT/ML IJ SOLN
5000.0000 [IU] | Freq: Three times a day (TID) | INTRAMUSCULAR | Status: DC
  Administered 2020-03-22 – 2020-03-23 (×2): 5000 [IU] via SUBCUTANEOUS
  Filled 2020-03-22 (×2): qty 1

## 2020-03-22 MED ORDER — IRBESARTAN 300 MG PO TABS
300.0000 mg | ORAL_TABLET | Freq: Every day | ORAL | Status: DC
  Administered 2020-03-22 – 2020-03-23 (×2): 300 mg via ORAL
  Filled 2020-03-22 (×2): qty 1

## 2020-03-22 MED ORDER — MELATONIN 5 MG PO TABS
5.0000 mg | ORAL_TABLET | Freq: Every day | ORAL | Status: DC
  Filled 2020-03-22 (×2): qty 1

## 2020-03-22 MED ORDER — SODIUM CHLORIDE 0.9 % IV SOLN
1.0000 g | Freq: Once | INTRAVENOUS | Status: AC
  Administered 2020-03-22: 1 g via INTRAVENOUS
  Filled 2020-03-22: qty 10

## 2020-03-22 MED ORDER — HYDROXYZINE HCL 25 MG PO TABS
50.0000 mg | ORAL_TABLET | Freq: Every evening | ORAL | Status: DC | PRN
  Administered 2020-03-22: 50 mg via ORAL
  Filled 2020-03-22: qty 2

## 2020-03-22 MED ORDER — SPIRONOLACTONE 25 MG PO TABS
25.0000 mg | ORAL_TABLET | Freq: Every day | ORAL | Status: DC
  Administered 2020-03-22 – 2020-03-23 (×2): 25 mg via ORAL
  Filled 2020-03-22 (×2): qty 1

## 2020-03-22 MED ORDER — OXYCODONE HCL 5 MG PO TABS: 5.0000 mg | ORAL_TABLET | ORAL | Status: DC | PRN

## 2020-03-22 MED ORDER — INSULIN ASPART 100 UNIT/ML ~~LOC~~ SOLN
0.0000 [IU] | Freq: Three times a day (TID) | SUBCUTANEOUS | Status: DC
  Administered 2020-03-22: 2 [IU] via SUBCUTANEOUS
  Administered 2020-03-22: 3 [IU] via SUBCUTANEOUS
  Administered 2020-03-22: 2 [IU] via SUBCUTANEOUS

## 2020-03-22 MED ORDER — SPIRONOLACTONE-HCTZ 25-25 MG PO TABS
1.0000 | ORAL_TABLET | Freq: Every day | ORAL | Status: DC
  Filled 2020-03-22: qty 1

## 2020-03-22 MED ORDER — INSULIN DETEMIR 100 UNIT/ML ~~LOC~~ SOLN
40.0000 [IU] | Freq: Every day | SUBCUTANEOUS | Status: DC
  Administered 2020-03-22: 40 [IU] via SUBCUTANEOUS
  Filled 2020-03-22 (×2): qty 0.4

## 2020-03-22 MED ORDER — DILTIAZEM HCL ER COATED BEADS 240 MG PO CP24
240.0000 mg | ORAL_CAPSULE | Freq: Every day | ORAL | Status: DC
  Administered 2020-03-22 – 2020-03-23 (×2): 240 mg via ORAL
  Filled 2020-03-22 (×2): qty 1

## 2020-03-22 MED ORDER — INSULIN ASPART 100 UNIT/ML ~~LOC~~ SOLN
0.0000 [IU] | Freq: Every day | SUBCUTANEOUS | Status: DC
  Administered 2020-03-22: 2 [IU] via SUBCUTANEOUS

## 2020-03-22 MED ORDER — GABAPENTIN 100 MG PO CAPS
100.0000 mg | ORAL_CAPSULE | Freq: Three times a day (TID) | ORAL | Status: DC
  Administered 2020-03-22 – 2020-03-23 (×4): 100 mg via ORAL
  Filled 2020-03-22 (×4): qty 1

## 2020-03-22 MED ORDER — PANTOPRAZOLE SODIUM 40 MG PO TBEC
40.0000 mg | DELAYED_RELEASE_TABLET | Freq: Every day | ORAL | Status: DC
  Administered 2020-03-22 – 2020-03-23 (×2): 40 mg via ORAL
  Filled 2020-03-22 (×2): qty 1

## 2020-03-22 MED ORDER — ATORVASTATIN CALCIUM 40 MG PO TABS
40.0000 mg | ORAL_TABLET | Freq: Every day | ORAL | Status: DC
  Administered 2020-03-22 – 2020-03-23 (×2): 40 mg via ORAL
  Filled 2020-03-22 (×2): qty 1

## 2020-03-22 MED ORDER — ONDANSETRON HCL 4 MG/2ML IJ SOLN: 4.0000 mg | Freq: Four times a day (QID) | INTRAMUSCULAR | Status: DC | PRN

## 2020-03-22 MED ORDER — ACETAMINOPHEN 650 MG RE SUPP: 650.0000 mg | Freq: Four times a day (QID) | RECTAL | Status: DC | PRN

## 2020-03-22 NOTE — Progress Notes (Signed)
This patient was admitted this morning. He is a 64 year old male with history of type 2 diabetes, hypertension, obesity admitted with acute change in mental status.  This was his second visit to the ER where he was treated for delirium with Geodon. I saw him in the patient's room his brother was present.  His wife was there earlier today but I was unable to reach her over the phone. He looked a stable he was awake alert oriented however he and his brother wanted him to stay another night and sleep better. He and his brother are also requesting to speak with psych, and refusing to be discharged prior to speaking to psych. His UA was consistent with UTI urine culture pending he is on IM Rocephin. Acute change in mental status was thought to be due to UTI, prostatitis, adverse drug reaction from Levaquin. We will monitor him overnight and plan discharge in a.m.

## 2020-03-22 NOTE — Progress Notes (Signed)
Dr. Rodena Piety:  I recently spoke with both the daughter and Mrs. Picker.  They are very concerned that this may be a manic episode.  It is very similar to one he had in the past and similar to both Mr. Chandler son and another family members manic/bipolar episodes.  They said he is very good about hiding his symptoms to others and when he is speaking in Arabic it is the same sentence over and over (like a broken record).  Often he will only cooperate if they tell him the [employer] of [place] insists he do what's requested (i.e. blood pressure, CBG, take meds etc...)  The daughter said that yesterday he was not even able to recognize any of his children.  He had no idea who they where.  This is why they are so insistent about a psych consult and him not being d/c.

## 2020-03-22 NOTE — ED Notes (Signed)
Pt uncooperative and combative, refusing to be put back on the monitor at this time for updated vital signs.

## 2020-03-22 NOTE — Consult Note (Signed)
  Wife is not at the bedside. Writer attempted to contact wife at number on file [phone], unsuccessful and unable to leave voicemail. Psych consult placed for family concerns of recurrent confusion and psychosis. Was able to reach out to his son who identified no immediate concerns, and stated he was out of town for the holiday.   Austin Norris is a 64 year old male with history of hypertension, diabetes (almost at goal of 7.2) who presented 2 times this week to the ED with confusion. On his last admission he was agitated and combative, requiring IM medications. He was admitted for acute metabolic encephalopathy. It appears he has also been treated for prostatitis and UTI. UA shows some improvement since his UA on 11/23, however contains moderated leukocytes, >500 glucose, an cloudy. Urine culture is pending.   Patient's current presentation very consistent with a metabolic encephalopathy related to infection.  Cognitive improvement will likely lag behind improvement in lab work.  Due to presence of the metabolic encephalopathy this is not an optimal time to evaluate patient for psychiatric related conditions and be able to make an accurate diagnosis as to its presence. At this age it is unlikely that patient will develop bipolar or schizophrenia, although UTI and infections have been known to precipitate such conditions.  Patient can be evaluated by neuropsychiatry on an outpatient basis once he has recovered from this hospitalization for a formal evaluation, diagnosis and possible treatment, if symptoms continue to worsen or persist.   -Will attempt to contact wife again tomorrow to address her concerns regarding bipolar, as her son was just diagnosed.  -There are no acute psychiatric concerns at this time.

## 2020-03-22 NOTE — Progress Notes (Signed)
Pt arrived from ED to room with wife at bedside.  Patient ambulated safely to the bed; VS taken, pt educated and oriented to room and unit.  Glucose obtained.  Explained a UA needed at next void. Lighting adjusted and        Call bell placed next to patient.  Pt. Calm and cooperative upon arrival.  Wife reports patient is back to his baseline personality.

## 2020-03-22 NOTE — ED Notes (Signed)
RN attempted to draw blood for labs. Pt slightly calmer, but refusing labs and being uncooperative. Unable to draw blood at this time

## 2020-03-22 NOTE — ED Notes (Signed)
Pt highly agitated and becoming more aggressive/combative. Pt attempting to walk out door, wife attempting to console and calm him down. Wife requested this NT to "cal somebody". Security called

## 2020-03-22 NOTE — ED Notes (Signed)
Pt given handheld urinal and reminded that urine sample is needed

## 2020-03-22 NOTE — Progress Notes (Signed)
   03/22/20 0701  Assess: MEWS Score  Temp 97.9 F (36.6 C)  BP (!) 156/89  Pulse Rate (!) 120  Resp 18  Level of Consciousness Alert  SpO2 100 %  O2 Device Room Air  Assess: MEWS Score  MEWS Temp 0  MEWS Systolic 0  MEWS Pulse 2  MEWS RR 0  MEWS LOC 0  MEWS Score 2  MEWS Score Color Yellow  Assess: if the MEWS score is Yellow or Red  Were vital signs taken at a resting state? Yes  Focused Assessment No change from prior assessment  Early Detection of Sepsis Score *See Row Information* Low  MEWS guidelines implemented *See Row Information* Yes  Treat  Pain Scale 0-10  Pain Score 0  Take Vital Signs  Increase Vital Sign Frequency  Yellow: Q 2hr X 2 then Q 4hr X 2, if remains yellow, continue Q 4hrs  Escalate  MEWS: Escalate Yellow: discuss with charge nurse/RN and consider discussing with provider and RRT  Notify: Charge Nurse/RN  Name of Charge Nurse/RN Notified Judson Roch  Date Charge Nurse/RN Notified 03/22/20  Time Charge Nurse/RN Notified 0715

## 2020-03-22 NOTE — H&P (Signed)
TRH H&P    Patient Demographics:    Austin Norris, is a 64 y.o. male  MRN: [id number]  DOB - Dec 07, 1955  Admit Date - 03/21/2020  Referring MD/NP/PA: Long  Outpatient Primary MD for the patient is Patient, No Pcp Per  Patient coming from: Home  Chief complaint- Altered mental status    HPI:    Austin Norris  is a 64 y.o. male, with history of umbilical hernia, hypertension, hyperlipidemia, PUD, diabetes mellitus, and more presents to the ER with a chief complaint of altered mental status.  This is actually patient's second visit to the ER this week.  Patient was seen on the 23rd in the ER for the same complaint of altered mental status.  At that time he was just very emotional.  It was noted that he had had a prostate procedure done on November 8, and return to the office with dysuria frequency and urgency on November 17.  He was also running a fever at that time he was given gentamicin in the office and then a prescription for Levaquin due to concern for possible prostatitis.  He had been taking the medication as prescribed and when he returned to the office on the 19th his symptoms were improving so they advised him to continue Levaquin for 2 more weeks.  At this time he was also given some unknown shots in the office that the wife thinks may have been steroid.  Over the last 4 or 5 days wife reports that patient has had altered mental status that has been progressively worse over that interval.  At first he was just very emotional, not able to sleep, stressed about the turmoil in his home country of [place], and was just not acting himself.  So at that visit on November 23 in the ER there was concern for antibiotic reaction versus his emotional state being secondary to insomnia.  He was prescribed hydroxyzine, and given a prescription for doxycycline for which she was told to switch to if getting a better night sleep  with the hydroxyzine does not improve his symptoms.  Wife reports that patient continued to get worse, became confused which was new.  He started asking if he was dead or alive and saying he must be dead.  Talking about times in the past as if they were now.  And he became aggressive with his wife so she brought him back into the ER.  She reports that the only pain he has been complaining of his dysuria.  Wife does report that patient had a history of 1 episode of mania in 49.  She reports that it lasted several weeks but was self resolving and he never sought out treatment.  She reports that his son has a diagnosis of bipolar disease.  She would like patient to continue being seen by psych during his stay.  In the ER patient was very agitated, combative, very difficult to redirect.  He was given Geodon and Ativan.  At the time of my exam he was very somnolent and not  able to give any history at all.  Wife provided all the history.  Concern now is for antibiotic reaction versus ongoing infection versus developing psychiatric problem.  In the ER Vitals stable No leukocytosis with a white blood cell count of 8.2 however there is a thrombocytosis at 435 Chemistry panel is unremarkable TSH is 0.709 UA from the 23rd shows greater than 500 glucose, large hemoglobin, 20 ketones, large leukocytes, greater than 50 white blood cells, and few bacteria Alcohol level was undetectable CT head was normal Levaquin was DC'd and Rocephin was started Urine culture from the visit on 03/20/2020 is still pending Continue to monitor    Review of systems:    Review of Systems  Unable to perform ROS: Mental status change      Past History of the following :    Past Medical History:  Diagnosis Date  . Diabetes mellitus   . H/O epistaxis   . History of stomach ulcers    30 YRS AGO  . Hypercholesteremia   . Hypertension   . Shoulder pain, left   . Umbilical hernia       Past Surgical History:   Procedure Laterality Date  . CARDIAC CATHETERIZATION  1997  . COLONOSCOPY    . UMBILICAL HERNIA REPAIR N/A 07/11/2015   Procedure: LAPAROSCOPIC ASSISTED OPEN UMBILICAL HERNIA;  Surgeon: Johnathan Hausen, MD;  Location: [facility];  Service: General;  Laterality: N/A;  With MESH      Social History:      Social History   Tobacco Use  . Smoking status: Former Smoker    Quit date: 05/21/1997    Years since quitting: 22.8  . Smokeless tobacco: Never Used  Substance Use Topics  . Alcohol use: No       Family History :     Family History  Problem Relation Age of Onset  . Heart disease Mother   . Diabetes Father   . Diabetes Sister      Home Medications:   Prior to Admission medications   Medication Sig Start Date End Date Taking? Authorizing Provider  aspirin 81 MG chewable tablet Chew 1 tablet (81 mg total) by mouth daily. 03/05/19  Yes Ward, Kristen N, DO  atorvastatin (LIPITOR) 40 MG tablet TAKE 1 TABLET BY MOUTH EVERY DAY Patient taking differently: Take 40 mg by mouth daily.  03/15/20  Yes Elayne Snare, MD  DILT-XR 240 MG 24 hr capsule TAKE 1 CAPSULE BY MOUTH EVERY DAY Patient taking differently: Take 240 mg by mouth daily.  12/20/19  Yes Elayne Snare, MD  gabapentin (NEURONTIN) 100 MG capsule TAKE 1 CAPSULE 3 TIMES DAILY AS NEEDED WITH FOOD FOR SYMPTOM RELIEF Patient taking differently: Take 100 mg by mouth 3 (three) times daily.  02/20/20  Yes Elayne Snare, MD  hydrOXYzine (ATARAX/VISTARIL) 25 MG tablet Take 2 tablets (50 mg total) by mouth at bedtime as needed (trouble sleeping). 03/20/20  Yes Plunkett, Loree Fee, MD  insulin aspart (NOVOLOG FLEXPEN) 100 UNIT/ML FlexPen Inject 35 Units into the skin in the morning, at noon, and at bedtime. 08/18/19  Yes Elayne Snare, MD  INVOKANA 300 MG TABS tablet TAKE 1 TABLET BY MOUTH EVERY DAY BEFORE BREAKFAST Patient taking differently: Take 300 mg by mouth daily before breakfast.  03/14/20  Yes Elayne Snare, MD  levofloxacin (LEVAQUIN) 500 MG  tablet Take 500 mg by mouth daily. 03/06/20  Yes [provider]  metFORMIN (GLUCOPHAGE-XR) 500 MG 24 hr tablet TAKE 1 TABLET BY MOUTH IN THE MORNING  AND 2 TABLETS IN THE EVENING Patient taking differently: Take 1,000 mg by mouth daily after supper.  01/19/20  Yes Elayne Snare, MD  olmesartan (BENICAR) 40 MG tablet TAKE 1 TABLET daily, call to schedule follow up Patient taking differently: Take 40 mg by mouth daily.  03/17/20  Yes Elayne Snare, MD  pantoprazole (PROTONIX) 40 MG tablet Take 40 mg by mouth daily.  02/13/20 05/13/20 Yes [provider]  spironolactone-hydrochlorothiazide (ALDACTAZIDE) 25-25 MG tablet TAKE 1 TABLET BY MOUTH EVERY DAY Patient taking differently: Take 1 tablet by mouth daily.  12/20/19  Yes Elayne Snare, MD  TRESIBA FLEXTOUCH 200 UNIT/ML FlexTouch Pen INJECT 44 UNITS INTO SKIN DAILY Patient taking differently: Inject 44 Units into the skin at bedtime.  02/12/20  Yes Elayne Snare, MD  Continuous Blood Gluc Sensor (FREESTYLE LIBRE 14 DAY SENSOR) MISC USE EVERY 14 DAYS 03/12/20   Elayne Snare, MD  doxycycline (VIBRAMYCIN) 100 MG capsule Take 1 capsule (100 mg total) by mouth 2 (two) times daily. 03/20/20   Blanchie Dessert, MD  glucose blood (CONTOUR NEXT TEST) test strip USE TO CHECK BLOOD SUGAR TWICE A DAY 11/23/19   Elayne Snare, MD  Insulin Syringe-Needle U-100 (INSULIN SYRINGE 1CC/31GX5/16") 31G X 5/16" 1 ML MISC Use to inject lantus daily 01/16/16   Elayne Snare, MD  MICROLET LANCETS MISC Use to check blood sugar 2 times a day 01/16/16   Elayne Snare, MD     Allergies:    No Known Allergies   Physical Exam:   Vitals  Blood pressure 112/69, pulse 65, temperature 97.7 F (36.5 C), temperature source Oral, resp. rate 15, SpO2 96 %.  1.  General: Patient lying right lateral decubitus in bed resting comfortably  2. Psychiatric: Unable to assess due to somnolence secondary to psychiatric medications, but reports of aggression combative behavior from  earlier in the night  3. Neurologic: Patient is not participating in neuro exam however he does move all 4 extremities voluntarily, face is symmetric  4. HEENMT:  Head is atraumatic, normocephalic, pupils reactive to light, neck is supple, trachea is midline, mucous membranes are moist  5. Respiratory : Lungs are clear to auscultation bilaterally, no wheezes, no rhonchi, no rales, no cyanosis, no clubbing  6. Cardiovascular : Heart rate is normal, rhythm is regular, no murmurs rubs or gallops  7. Gastrointestinal:  Abdomen is soft, nondistended, nontender to palpation, bowel sounds active  8. Skin:  Skin is warm dry and intact with no acute lesions  9.Musculoskeletal:  Peripheral pulses palpated, no calf tenderness, no peripheral edema    Data Review:    CBC Recent Labs  Lab 03/20/20 2050 03/22/20 0246  WBC 9.9 8.2  HGB 16.1 14.9  HCT 48.4 45.8  PLT 440* 435*  MCV 89.5 88.4  MCH 29.8 28.8  MCHC 33.3 32.5  RDW 13.6 13.1  LYMPHSABS 1.8 2.1  MONOABS 0.9 0.8  EOSABS 0.0 0.1  BASOSABS 0.1 0.1   ------------------------------------------------------------------------------------------------------------------  Results for orders placed or performed during the hospital encounter of 03/21/20 (from the past 48 hour(s))  Comprehensive metabolic panel     Status: Abnormal   Collection Time: 03/22/20  2:46 AM  Result Value Ref Range   Sodium 131 (L) 135 - 145 mmol/L   Potassium 3.7 3.5 - 5.1 mmol/L   Chloride 97 (L) 98 - 111 mmol/L   CO2 23 22 - 32 mmol/L   Glucose, Bld 172 (H) 70 - 99 mg/dL    Comment: Glucose reference range applies  only to samples taken after fasting for at least 8 hours.   BUN 24 (H) 8 - 23 mg/dL   Creatinine, Ser 1.12 0.61 - 1.24 mg/dL   Calcium 9.2 8.9 - 10.3 mg/dL   Total Protein 6.8 6.5 - 8.1 g/dL   Albumin 3.5 3.5 - 5.0 g/dL   AST 22 15 - 41 U/L   ALT 22 0 - 44 U/L   Alkaline Phosphatase 79 38 - 126 U/L   Total Bilirubin 0.6 0.3 - 1.2 mg/dL    GFR, Estimated >60 >60 mL/min    Comment: (NOTE) Calculated using the CKD-EPI Creatinine Equation (2021)    Anion gap 11 5 - 15    Comment: Performed at [facility] [address], [place], [place] [address]  CBC with Differential     Status: Abnormal   Collection Time: 03/22/20  2:46 AM  Result Value Ref Range   WBC 8.2 4.0 - 10.5 K/uL   RBC 5.18 4.22 - 5.81 MIL/uL   Hemoglobin 14.9 13.0 - 17.0 g/dL   HCT 45.8 39 - 52 %   MCV 88.4 80.0 - 100.0 fL   MCH 28.8 26.0 - 34.0 pg   MCHC 32.5 30.0 - 36.0 g/dL   RDW 13.1 11.5 - 15.5 %   Platelets 435 (H) 150 - 400 K/uL   nRBC 0.0 0.0 - 0.2 %   Neutrophils Relative % 59 %   Neutro Abs 4.9 1.7 - 7.7 K/uL   Lymphocytes Relative 26 %   Lymphs Abs 2.1 0.7 - 4.0 K/uL   Monocytes Relative 10 %   Monocytes Absolute 0.8 0.1 - 1.0 K/uL   Eosinophils Relative 1 %   Eosinophils Absolute 0.1 0.0 - 0.5 K/uL   Basophils Relative 1 %   Basophils Absolute 0.1 0.0 - 0.1 K/uL   Immature Granulocytes 3 %   Abs Immature Granulocytes 0.25 (H) 0.00 - 0.07 K/uL    Comment: Performed at [facility], [address] [place], [place] [address]  TSH     Status: None   Collection Time: 03/22/20  2:46 AM  Result Value Ref Range   TSH 0.709 0.350 - 4.500 uIU/mL    Comment: Performed by a 3rd Generation assay with a functional sensitivity of <=0.01 uIU/mL. Performed at [facility], [place] [address] [place], [place] [address]   Ethanol     Status: None   Collection Time: 03/22/20  2:46 AM  Result Value Ref Range   Alcohol, Ethyl (B) <10 <10 mg/dL    Comment: (NOTE) Lowest detectable limit for serum alcohol is 10 mg/dL.  For medical purposes only. Performed at [facility], [place] [address] [place], [place] [address]   Resp Panel by RT-PCR (Flu A&B, Covid) Nasopharyngeal Swab     Status: None   Collection Time: 03/22/20  2:46 AM   Specimen: Nasopharyngeal Swab; Nasopharyngeal(NP) swabs in vial transport medium  Result Value Ref  Range   SARS Coronavirus 2 by RT PCR NEGATIVE NEGATIVE    Comment: (NOTE) SARS-CoV-2 target nucleic acids are NOT DETECTED.  The SARS-CoV-2 RNA is generally detectable in upper respiratory specimens during the acute phase of infection. The lowest concentration of SARS-CoV-2 viral copies this assay can detect is 138 copies/mL. A negative result does not preclude SARS-Cov-2 infection and should not be used as the sole basis for treatment or other patient management decisions. A negative result may occur with  improper specimen collection/handling, submission of  specimen other than nasopharyngeal swab, presence of viral mutation(s) within the areas targeted by this assay, and inadequate number of viral copies(<138 copies/mL). A negative result must be combined with clinical observations, patient history, and epidemiological information. The expected result is Negative.  Fact Sheet for Patients:  EntrepreneurPulse.com.au  Fact Sheet for Healthcare Providers:  IncredibleEmployment.be  This test is no t yet approved or cleared by the [place] FDA and  has been authorized for detection and/or diagnosis of SARS-CoV-2 by FDA under an Emergency Use Authorization (EUA). This EUA will remain  in effect (meaning this test can be used) for the duration of the COVID-19 declaration under Section 564(b)(1) of the Act, 21 U.S.C.section 360bbb-3(b)(1), unless the authorization is terminated  or revoked sooner.       Influenza A by PCR NEGATIVE NEGATIVE   Influenza B by PCR NEGATIVE NEGATIVE    Comment: (NOTE) The Xpert Xpress SARS-CoV-2/FLU/RSV plus assay is intended as an aid in the diagnosis of influenza from Nasopharyngeal swab specimens and should not be used as a sole basis for treatment. Nasal washings and aspirates are unacceptable for Xpert Xpress SARS-CoV-2/FLU/RSV testing.  Fact Sheet for  Patients: EntrepreneurPulse.com.au  Fact Sheet for Healthcare Providers: IncredibleEmployment.be  This test is not yet approved or cleared by the [place] FDA and has been authorized for detection and/or diagnosis of SARS-CoV-2 by FDA under an Emergency Use Authorization (EUA). This EUA will remain in effect (meaning this test can be used) for the duration of the COVID-19 declaration under Section 564(b)(1) of the Act, 21 U.S.C. section 360bbb-3(b)(1), unless the authorization is terminated or revoked.  Performed at [facility], [place] [address] [place], [place] [address]   Ammonia     Status: None   Collection Time: 03/22/20  2:46 AM  Result Value Ref Range   Ammonia 27 9 - 35 umol/L    Comment: Performed at [facility], [place] [address] [place], [place] [address]    Chemistries  Recent Labs  Lab 03/20/20 2050 03/22/20 0246  NA 132* 131*  K 4.2 3.7  CL 98 97*  CO2 18* 23  GLUCOSE 142* 172*  BUN 23 24*  CREATININE 1.35* 1.12  CALCIUM 9.9 9.2  AST 26 22  ALT 26 22  ALKPHOS 84 79  BILITOT 1.3* 0.6   ------------------------------------------------------------------------------------------------------------------  ------------------------------------------------------------------------------------------------------------------ GFR: Estimated Creatinine Clearance: 63.2 mL/min (by C-G formula based on SCr of 1.12 mg/dL). Liver Function Tests: Recent Labs  Lab 03/20/20 2050 03/22/20 0246  AST 26 22  ALT 26 22  ALKPHOS 84 79  BILITOT 1.3* 0.6  PROT 7.9 6.8  ALBUMIN 4.1 3.5   No results for input(s): LIPASE, AMYLASE in the last 168 hours. Recent Labs  Lab 03/22/20 0246  AMMONIA 27   Coagulation Profile: No results for input(s): INR, PROTIME in the last 168 hours. Cardiac Enzymes: No results for input(s): CKTOTAL, CKMB, CKMBINDEX, TROPONINI in the last 168 hours. BNP (last 3 results) No results for  input(s): PROBNP in the last 8760 hours. HbA1C: No results for input(s): HGBA1C in the last 72 hours. CBG: No results for input(s): GLUCAP in the last 168 hours. Lipid Profile: No results for input(s): CHOL, HDL, LDLCALC, TRIG, CHOLHDL, LDLDIRECT in the last 72 hours. Thyroid Function Tests: Recent Labs    03/22/20 0246  TSH 0.709   Anemia Panel: No results for input(s): VITAMINB12, FOLATE, FERRITIN, TIBC, IRON, RETICCTPCT in the last 72 hours.  --------------------------------------------------------------------------------------------------------------- Urine analysis:  Component Value Date/Time   COLORURINE YELLOW 03/20/2020 1840   APPEARANCEUR CLOUDY (A) 03/20/2020 1840   LABSPEC 1.030 03/20/2020 1840   PHURINE 5.0 03/20/2020 1840   GLUCOSEU >=500 (A) 03/20/2020 1840   GLUCOSEU >=1000 (A) 07/12/2019 1442   HGBUR LARGE (A) 03/20/2020 1840   BILIRUBINUR NEGATIVE 03/20/2020 1840   KETONESUR 20 (A) 03/20/2020 1840   PROTEINUR 100 (A) 03/20/2020 1840   UROBILINOGEN 0.2 07/12/2019 1442   NITRITE NEGATIVE 03/20/2020 1840   LEUKOCYTESUR LARGE (A) 03/20/2020 1840      Imaging Results:    CT Head Wo Contrast  Result Date: 03/20/2020 CLINICAL DATA:  Encephalopathy EXAM: CT HEAD WITHOUT CONTRAST TECHNIQUE: Contiguous axial images were obtained from the base of the skull through the vertex without intravenous contrast. COMPARISON:  03/04/2019 FINDINGS: Brain: There is no mass, hemorrhage or extra-axial collection. The size and configuration of the ventricles and extra-axial CSF spaces are normal. The brain parenchyma is normal, without acute or chronic infarction. Vascular: No abnormal hyperdensity of the major intracranial arteries or dural venous sinuses. No intracranial atherosclerosis. Skull: The visualized skull base, calvarium and extracranial soft tissues are normal. Sinuses/Orbits: No fluid levels or advanced mucosal thickening of the visualized paranasal sinuses. No mastoid  or middle ear effusion. The orbits are normal. IMPRESSION: Normal head CT. Electronically Signed   By: Ulyses Jarred M.D.   On: 03/20/2020 20:36     Assessment & Plan:    Active Problems:   Acute metabolic encephalopathy   1. Acute metabolic encephalopathy 1. Secondary to antibiotic reaction versus ongoing infection versus psychiatric etiology 2. Change antibiotic from Levaquin to Rocephin 3. Urine culture pending 4. CT head was normal 5. No blood work abnormalities that would explain this change in mental status 6. Continue to monitor 2. Thrombocytosis 1. Acute phase reaction 2. Platelets 435 3. Continue to monitor 3. Prostatitis 1. Is diagnosed in the outpatient urology clinic 2. Urine culture there had pansensitive E. coli except for ampicillin and Bactrim 3. Repeat urine culture here is pending from 03/20/2020 4. Continue Rocephin 5. Continue to monitor 4. GERD 1. Continue Protonix 5. Hypertension 1. Continue spironolactone, ARB 6. Diabetes mellitus type 2 1. Patient uses 44 units of Tresiba daily 2. He also uses 35 units of NovoLog 3 times daily 3. Continue long-acting insulin with sliding scale coverage 4. Diabetic diet 5. Continue monitor 7.    DVT Prophylaxis-   Heparin- SCDs   AM Labs Ordered, also please review Full Orders  Family Communication: Admission, patients condition and plan of care including tests being ordered have been discussed wife who indicate understanding and agree with the plan and Code Status.  Code Status: Full  Admission status: Inpatient :The appropriate admission status for this patient is INPATIENT. Inpatient status is judged to be reasonable and necessary in order to provide the required intensity of service to ensure the patient's safety. The patient's presenting symptoms, physical exam findings, and initial radiographic and laboratory data in the context of their chronic comorbidities is felt to place them at high risk for further  clinical deterioration. Furthermore, it is not anticipated that the patient will be medically stable for discharge from the hospital within 2 midnights of admission. The following factors support the admission status of inpatient.     The patient's presenting symptoms include altered mental status The worrisome physical exam findings include aggressive and combative at presentation. The initial radiographic and laboratory data are worrisome because of thrombocytosis 1435. The chronic co-morbidities include diabetes  mellitus type 2, hypertension, hyperlipidemia.       * I certify that at the point of admission it is my clinical judgment that the patient will require inpatient hospital care spanning beyond 2 midnights from the point of admission due to high intensity of service, high risk for further deterioration and high frequency of surveillance required.*  Time spent in minutes : [place]

## 2020-03-23 ENCOUNTER — Other Ambulatory Visit: Payer: Self-pay | Admitting: Family

## 2020-03-23 ENCOUNTER — Other Ambulatory Visit: Payer: Self-pay

## 2020-03-23 DIAGNOSIS — R410 Disorientation, unspecified: Secondary | ICD-10-CM

## 2020-03-23 LAB — BASIC METABOLIC PANEL
Anion gap: 15 (ref 5–15)
BUN: 28 mg/dL — ABNORMAL HIGH (ref 8–23)
CO2: 20 mmol/L — ABNORMAL LOW (ref 22–32)
Calcium: 9 mg/dL (ref 8.9–10.3)
Chloride: 97 mmol/L — ABNORMAL LOW (ref 98–111)
Creatinine, Ser: 1.08 mg/dL (ref 0.61–1.24)
GFR, Estimated: >60 mL/min (ref >60)
Glucose, Bld: 95 mg/dL (ref 70–99)
Potassium: 3.6 mmol/L (ref 3.5–5.1)
Sodium: 132 mmol/L — ABNORMAL LOW (ref 135–145)

## 2020-03-23 LAB — GLUCOSE, CAPILLARY
Glucose-Capillary: 88 mg/dL (ref 70–99)
Glucose-Capillary: 115 mg/dL — ABNORMAL HIGH (ref 70–99)

## 2020-03-23 LAB — HEMOGLOBIN A1C
Hgb A1c MFr Bld: 7.3 % — ABNORMAL HIGH (ref 4.8–5.6)
Mean Plasma Glucose: 162.81 mg/dL

## 2020-03-23 MED ORDER — TRAZODONE HCL 50 MG PO TABS: 50.0000 mg | ORAL_TABLET | Freq: Every day | ORAL | 0 refills | Status: DC

## 2020-03-23 MED ORDER — LEVOFLOXACIN 500 MG PO TABS: 500.0000 mg | ORAL_TABLET | Freq: Every day | ORAL | 0 refills | Status: AC

## 2020-03-23 MED ORDER — SPIRONOLACTONE 25 MG PO TABS
25.0000 mg | ORAL_TABLET | Freq: Every day | ORAL | 1 refills | Status: DC
Start: 2020-03-24 — End: 2020-08-24

## 2020-03-23 NOTE — Consult Note (Signed)
[facility] Consult   Reason for Consult:" Decline in mental status family history of bipolar disorder." Referring Physician:  Internal Medicine  Patient Identification: Austin Norris MRN:  [id number] Principal Diagnosis: <principal problem not specified> Diagnosis:  Active Problems:   Acute metabolic encephalopathy   Total Time spent with patient: 15 minutes  Subjective:   Austin Norris is a 64 y.o. male was seen via face-to-face.  patient's wife at bedside.  Patient provided verbal authorization for patient to stay during assessment. Denying suicidal or homicidal ideations.  Denies auditory or visual hallucinations.  Was consulted due to " manic behavior" patient reported similar episode while in [place] however denied that he has been followed by therapy and/or psychiatry after previous episode "may years ago."  Wife reported decline in behavior she reported that " he is very talkative and happy."  Wife had concerns that patient is Bipolar as well  due to patient's son was recently diagnosed with bipolar disorder. Chart reviewed patient currently treated for UTI/bladder infection. Patient has a current diagnosis with acute metabolic encephalopathy.  Once medically stable patient to consider outpatient follow-up.  Patient presented calm, cooperative and pleasant. Kaynan reported symptoms of worry due to current health state.  Does report some depression.  States that he has not been resting well at night. Stated about 2 to 3 hours. Reported he had a goodnight last night with medication.    Discussed following up outpatient for therapy and/or psychiatry.  Patient and family was receptive to plan.  Discussed contacting insurance carrier for mental health coverage and or following up at [facility] behavioral health [facility]).  Patient was receptive to plan.  Support, encouragement and  reassurance was provided.  HPI:  Per admission assessment note:-Austin Norris is a 64 y.o. male with PMH  reviewed below including recent prostate surgery with resulting E. Coli UTI started on Levaquin on the 17th with UTI symptoms at that time presents to the ED now with worsening AMS, agitation, and mild aggression. Wife at bedside provides most of the history. Patient was in the ED yesterday with similar but by report was much more coherent and non-violent. W/u at that time showed possible residual UTI. Patient and family did not want admission and returned home with Doxycycline and medication for anxiety/sleep. Wife reports a brief "mania" episode in the 1980s but that resolved without medication. No psychiatric episodes since.  Past Psychiatric History:   Risk to Self:   Risk to Others:   Prior Inpatient Therapy:   Prior Outpatient Therapy:    Past Medical History:  Past Medical History:  Diagnosis Date  . Diabetes mellitus   . H/O epistaxis   . History of stomach ulcers    30 YRS AGO  . Hypercholesteremia   . Hypertension   . Shoulder pain, left   . Umbilical hernia     Past Surgical History:  Procedure Laterality Date  . CARDIAC CATHETERIZATION  1997  . COLONOSCOPY    . UMBILICAL HERNIA REPAIR N/A 07/11/2015   Procedure: LAPAROSCOPIC ASSISTED OPEN UMBILICAL HERNIA;  Surgeon: Johnathan Hausen, MD;  Location: [facility];  Service: General;  Laterality: N/A;  With MESH   Family History:  Family History  Problem Relation Age of Onset  . Heart disease Mother   . Diabetes Father   . Diabetes Sister    Family Psychiatric  History:  Social History:  Social History   Substance and Sexual Activity  Alcohol Use No     Social  History   Substance and Sexual Activity  Drug Use No    Social History   Socioeconomic History  . Marital status: Married    Spouse name: Not on file  . Number of children: 5  . Years of education: Not on file  . Highest education level: Not on file  Occupational History  . Occupation: Scientist, clinical (histocompatibility and immunogenetics): [employer]  Tobacco Use  . Smoking status: Former  Smoker    Quit date: 05/21/1997    Years since quitting: 22.8  . Smokeless tobacco: Never Used  Substance and Sexual Activity  . Alcohol use: No  . Drug use: No  . Sexual activity: Not on file  Other Topics Concern  . Not on file  Social History Narrative  . Not on file   Social Determinants of Health   Financial Resource Strain:   . Difficulty of Paying Living Expenses: Not on file  Food Insecurity:   . Worried About Charity fundraiser in the Last Year: Not on file  . Ran Out of Food in the Last Year: Not on file  Transportation Needs:   . Lack of Transportation (Medical): Not on file  . Lack of Transportation (Non-Medical): Not on file  Physical Activity:   . Days of Exercise per Week: Not on file  . Minutes of Exercise per Session: Not on file  Stress:   . Feeling of Stress : Not on file  Social Connections:   . Frequency of Communication with Friends and Family: Not on file  . Frequency of Social Gatherings with Friends and Family: Not on file  . Attends Religious Services: Not on file  . Active Member of Clubs or Organizations: Not on file  . Attends Archivist Meetings: Not on file  . Marital Status: Not on file   Additional Social History:    Allergies:  No Known Allergies  Labs:  Results for orders placed or performed during the hospital encounter of 03/21/20 (from the past 48 hour(s))  Comprehensive metabolic panel     Status: Abnormal   Collection Time: 03/22/20  2:46 AM  Result Value Ref Range   Sodium 131 (L) 135 - 145 mmol/L   Potassium 3.7 3.5 - 5.1 mmol/L   Chloride 97 (L) 98 - 111 mmol/L   CO2 23 22 - 32 mmol/L   Glucose, Bld 172 (H) 70 - 99 mg/dL    Comment: Glucose reference range applies only to samples taken after fasting for at least 8 hours.   BUN 24 (H) 8 - 23 mg/dL   Creatinine, Ser 1.12 0.61 - 1.24 mg/dL   Calcium 9.2 8.9 - 10.3 mg/dL   Total Protein 6.8 6.5 - 8.1 g/dL   Albumin 3.5 3.5 - 5.0 g/dL   AST 22 15 - 41 U/L   ALT  22 0 - 44 U/L   Alkaline Phosphatase 79 38 - 126 U/L   Total Bilirubin 0.6 0.3 - 1.2 mg/dL   GFR, Estimated >60 >60 mL/min    Comment: (NOTE) Calculated using the CKD-EPI Creatinine Equation (2021)    Anion gap 11 5 - 15    Comment: Performed at [facility] [address] [place], [place] [address]  CBC with Differential     Status: Abnormal   Collection Time: 03/22/20  2:46 AM  Result Value Ref Range   WBC 8.2 4.0 - 10.5 K/uL   RBC 5.18 4.22 - 5.81 MIL/uL   Hemoglobin 14.9 13.0 -  17.0 g/dL   HCT 45.8 39 - 52 %   MCV 88.4 80.0 - 100.0 fL   MCH 28.8 26.0 - 34.0 pg   MCHC 32.5 30.0 - 36.0 g/dL   RDW 13.1 11.5 - 15.5 %   Platelets 435 (H) 150 - 400 K/uL   nRBC 0.0 0.0 - 0.2 %   Neutrophils Relative % 59 %   Neutro Abs 4.9 1.7 - 7.7 K/uL   Lymphocytes Relative 26 %   Lymphs Abs 2.1 0.7 - 4.0 K/uL   Monocytes Relative 10 %   Monocytes Absolute 0.8 0.1 - 1.0 K/uL   Eosinophils Relative 1 %   Eosinophils Absolute 0.1 0.0 - 0.5 K/uL   Basophils Relative 1 %   Basophils Absolute 0.1 0.0 - 0.1 K/uL   Immature Granulocytes 3 %   Abs Immature Granulocytes 0.25 (H) 0.00 - 0.07 K/uL    Comment: Performed at [facility] [address] [place], [place] [address]  TSH     Status: None   Collection Time: 03/22/20  2:46 AM  Result Value Ref Range   TSH 0.709 0.350 - 4.500 uIU/mL    Comment: Performed by a 3rd Generation assay with a functional sensitivity of <=0.01 uIU/mL. Performed at [facility], [place] [address], [place], [place] [address]   Ethanol     Status: None   Collection Time: 03/22/20  2:46 AM  Result Value Ref Range   Alcohol, Ethyl (B) <10 <10 mg/dL    Comment: (NOTE) Lowest detectable limit for serum alcohol is 10 mg/dL.  For medical purposes only. Performed at [facility], [place] [address], [place], [place] [address]   Resp Panel by RT-PCR (Flu A&B, Covid) Nasopharyngeal Swab     Status: None   Collection Time: 03/22/20  2:46 AM    Specimen: Nasopharyngeal Swab; Nasopharyngeal(NP) swabs in vial transport medium  Result Value Ref Range   SARS Coronavirus 2 by RT PCR NEGATIVE NEGATIVE    Comment: (NOTE) SARS-CoV-2 target nucleic acids are NOT DETECTED.  The SARS-CoV-2 RNA is generally detectable in upper respiratory specimens during the acute phase of infection. The lowest concentration of SARS-CoV-2 viral copies this assay can detect is 138 copies/mL. A negative result does not preclude SARS-Cov-2 infection and should not be used as the sole basis for treatment or other patient management decisions. A negative result may occur with  improper specimen collection/handling, submission of specimen other than nasopharyngeal swab, presence of viral mutation(s) within the areas targeted by this assay, and inadequate number of viral copies(<138 copies/mL). A negative result must be combined with clinical observations, patient history, and epidemiological information. The expected result is Negative.  Fact Sheet for Patients:  EntrepreneurPulse.com.au  Fact Sheet for Healthcare Providers:  IncredibleEmployment.be  This test is no t yet approved or cleared by the [place] FDA and  has been authorized for detection and/or diagnosis of SARS-CoV-2 by FDA under an Emergency Use Authorization (EUA). This EUA will remain  in effect (meaning this test can be used) for the duration of the COVID-19 declaration under Section 564(b)(1) of the Act, 21 U.S.C.section 360bbb-3(b)(1), unless the authorization is terminated  or revoked sooner.       Influenza A by PCR NEGATIVE NEGATIVE   Influenza B by PCR NEGATIVE NEGATIVE    Comment: (NOTE) The Xpert Xpress SARS-CoV-2/FLU/RSV plus assay is intended as an aid in the diagnosis of influenza from Nasopharyngeal swab specimens and should not be used  as a sole basis for treatment. Nasal washings and aspirates are unacceptable for Xpert Xpress  SARS-CoV-2/FLU/RSV testing.  Fact Sheet for Patients: EntrepreneurPulse.com.au  Fact Sheet for Healthcare Providers: IncredibleEmployment.be  This test is not yet approved or cleared by the [place] FDA and has been authorized for detection and/or diagnosis of SARS-CoV-2 by FDA under an Emergency Use Authorization (EUA). This EUA will remain in effect (meaning this test can be used) for the duration of the COVID-19 declaration under Section 564(b)(1) of the Act, 21 U.S.C. section 360bbb-3(b)(1), unless the authorization is terminated or revoked.  Performed at [facility], [place] [address], [place], [place] [address]   Ammonia     Status: None   Collection Time: 03/22/20  2:46 AM  Result Value Ref Range   Ammonia 27 9 - 35 umol/L    Comment: Performed at [facility], [place] [address] [place], [place] [address]  Glucose, capillary     Status: Abnormal   Collection Time: 03/22/20  2:49 AM  Result Value Ref Range   Glucose-Capillary 150 (H) 70 - 99 mg/dL    Comment: Glucose reference range applies only to samples taken after fasting for at least 8 hours.  Glucose, capillary     Status: Abnormal   Collection Time: 03/22/20  6:57 AM  Result Value Ref Range   Glucose-Capillary 150 (H) 70 - 99 mg/dL    Comment: Glucose reference range applies only to samples taken after fasting for at least 8 hours.  Glucose, capillary     Status: Abnormal   Collection Time: 03/22/20  7:40 AM  Result Value Ref Range   Glucose-Capillary 168 (H) 70 - 99 mg/dL    Comment: Glucose reference range applies only to samples taken after fasting for at least 8 hours.  HIV Antibody (routine testing w rflx)     Status: None   Collection Time: 03/22/20  8:01 AM  Result Value Ref Range   HIV Screen 4th Generation wRfx Non Reactive Non Reactive    Comment: Performed at [facility], [place] [address], [place], [place] [address]  Urine rapid drug screen (hosp  performed)     Status: None   Collection Time: 03/22/20  8:06 AM  Result Value Ref Range   Opiates NONE DETECTED NONE DETECTED   Cocaine NONE DETECTED NONE DETECTED   Benzodiazepines NONE DETECTED NONE DETECTED   Amphetamines NONE DETECTED NONE DETECTED   Tetrahydrocannabinol NONE DETECTED NONE DETECTED   Barbiturates NONE DETECTED NONE DETECTED    Comment: (NOTE) DRUG SCREEN FOR MEDICAL PURPOSES ONLY.  IF CONFIRMATION IS NEEDED FOR ANY PURPOSE, NOTIFY LAB WITHIN 5 DAYS.  LOWEST DETECTABLE LIMITS FOR URINE DRUG SCREEN Drug Class                     Cutoff (ng/mL) Amphetamine and metabolites    1000 Barbiturate and metabolites    200 Benzodiazepine                 536 Tricyclics and metabolites     300 Opiates and metabolites        300 Cocaine and metabolites        300 THC                            50 Performed at [facility], [place] [address] [place],  [address]   Urinalysis, Routine w reflex microscopic  Urine, Clean Catch     Status: Abnormal   Collection Time: 03/22/20  8:07 AM  Result Value Ref Range   Color, Urine YELLOW YELLOW   APPearance CLOUDY (A) CLEAR   Specific Gravity, Urine 1.027 1.005 - 1.030   pH 5.0 5.0 - 8.0   Glucose, UA >=500 (A) NEGATIVE mg/dL   Hgb urine dipstick LARGE (A) NEGATIVE   Bilirubin Urine NEGATIVE NEGATIVE   Ketones, ur NEGATIVE NEGATIVE mg/dL   Protein, ur 30 (A) NEGATIVE mg/dL   Nitrite NEGATIVE NEGATIVE   Leukocytes,Ua MODERATE (A) NEGATIVE   RBC / HPF >50 (H) 0 - 5 RBC/hpf   WBC, UA >50 (H) 0 - 5 WBC/hpf   Bacteria, UA FEW (A) NONE SEEN   Squamous Epithelial / LPF 0-5 0 - 5   WBC Clumps PRESENT     Comment: Performed at [facility], [place] [address] [place], [place] [address]  Glucose, capillary     Status: Abnormal   Collection Time: 03/22/20  1:28 PM  Result Value Ref Range   Glucose-Capillary 121 (H) 70 - 99 mg/dL    Comment: Glucose reference range applies only to samples taken after fasting for at least  8 hours.  Glucose, capillary     Status: Abnormal   Collection Time: 03/22/20  4:13 PM  Result Value Ref Range   Glucose-Capillary 129 (H) 70 - 99 mg/dL    Comment: Glucose reference range applies only to samples taken after fasting for at least 8 hours.  Glucose, capillary     Status: Abnormal   Collection Time: 03/22/20  9:07 PM  Result Value Ref Range   Glucose-Capillary 245 (H) 70 - 99 mg/dL    Comment: Glucose reference range applies only to samples taken after fasting for at least 8 hours.  Hemoglobin A1c     Status: Abnormal   Collection Time: 03/23/20  2:16 AM  Result Value Ref Range   Hgb A1c MFr Bld 7.3 (H) 4.8 - 5.6 %    Comment: (NOTE) Pre diabetes:          5.7%-6.4%  Diabetes:              >6.4%  Glycemic control for   <7.0% adults with diabetes    Mean Plasma Glucose 162.81 mg/dL    Comment: Performed at [facility] [address], [place], [place] [address]  Basic metabolic panel     Status: Abnormal   Collection Time: 03/23/20  2:16 AM  Result Value Ref Range   Sodium 132 (L) 135 - 145 mmol/L   Potassium 3.6 3.5 - 5.1 mmol/L   Chloride 97 (L) 98 - 111 mmol/L   CO2 20 (L) 22 - 32 mmol/L   Glucose, Bld 95 70 - 99 mg/dL    Comment: Glucose reference range applies only to samples taken after fasting for at least 8 hours.   BUN 28 (H) 8 - 23 mg/dL   Creatinine, Ser 1.08 0.61 - 1.24 mg/dL   Calcium 9.0 8.9 - 10.3 mg/dL   GFR, Estimated >60 >60 mL/min    Comment: (NOTE) Calculated using the CKD-EPI Creatinine Equation (2021)    Anion gap 15 5 - 15    Comment: Performed at [facility] [address] [place], [place] [address]  Glucose, capillary     Status: None   Collection Time: 03/23/20  7:52 AM  Result Value Ref Range   Glucose-Capillary 88 70 - 99 mg/dL  Comment: Glucose reference range applies only to samples taken after fasting for at least 8 hours.    Current Facility-Administered Medications  Medication Dose Route Frequency  Provider Last Rate Last Admin  . acetaminophen (TYLENOL) tablet 650 mg  650 mg Oral Q6H PRN Zierle-Ghosh, Asia B, DO       Or  . acetaminophen (TYLENOL) suppository 650 mg  650 mg Rectal Q6H PRN Zierle-Ghosh, Asia B, DO      . aspirin chewable tablet 81 mg  81 mg Oral Daily Zierle-Ghosh, Asia B, DO   81 mg at 03/22/20 0859  . atorvastatin (LIPITOR) tablet 40 mg  40 mg Oral Daily Zierle-Ghosh, Asia B, DO   40 mg at 03/22/20 0901  . cefTRIAXone (ROCEPHIN) 2 g in sodium chloride 0.9 % 100 mL IVPB  2 g Intravenous Q24H Zierle-Ghosh, Asia B, DO   Stopping Infusion hung by another clincian at 03/22/20 2116  . diltiazem (CARDIZEM CD) 24 hr capsule 240 mg  240 mg Oral Daily Zierle-Ghosh, Asia B, DO   240 mg at 03/22/20 0859  . gabapentin (NEURONTIN) capsule 100 mg  100 mg Oral TID Zierle-Ghosh, Asia B, DO   100 mg at 03/22/20 2154  . heparin injection 5,000 Units  5,000 Units Subcutaneous Q8H Zierle-Ghosh, Asia B, DO   5,000 Units at 03/23/20 0611  . hydrOXYzine (ATARAX/VISTARIL) tablet 50 mg  50 mg Oral QHS PRN Zierle-Ghosh, Asia B, DO   50 mg at 03/22/20 2154  . insulin aspart (novoLOG) injection 0-15 Units  0-15 Units Subcutaneous TID WC Zierle-Ghosh, Asia B, DO   2 Units at 03/22/20 1741  . insulin aspart (novoLOG) injection 0-5 Units  0-5 Units Subcutaneous QHS Zierle-Ghosh, Asia B, DO   2 Units at 03/22/20 2150  . insulin detemir (LEVEMIR) injection 40 Units  40 Units Subcutaneous QHS Zierle-Ghosh, Asia B, DO   40 Units at 03/22/20 2153  . irbesartan (AVAPRO) tablet 300 mg  300 mg Oral Daily Zierle-Ghosh, Asia B, DO   300 mg at 03/22/20 0859  . melatonin tablet 5 mg  5 mg Oral QHS Georgette Shell, MD      . ondansetron [facility]) tablet 4 mg  4 mg Oral Q6H PRN Zierle-Ghosh, Asia B, DO       Or  . ondansetron (ZOFRAN) injection 4 mg  4 mg Intravenous Q6H PRN Zierle-Ghosh, Asia B, DO      . oxyCODONE (Oxy IR/ROXICODONE) immediate release tablet 5 mg  5 mg Oral Q4H PRN Zierle-Ghosh, Asia B, DO       . pantoprazole (PROTONIX) EC tablet 40 mg  40 mg Oral Daily Zierle-Ghosh, Asia B, DO   40 mg at 03/22/20 0858  . polyethylene glycol (MIRALAX / GLYCOLAX) packet 17 g  17 g Oral Daily PRN Zierle-Ghosh, Asia B, DO      . spironolactone (ALDACTONE) tablet 25 mg  25 mg Oral Daily Georgette Shell, MD   25 mg at 03/22/20 0901    Musculoskeletal: Strength & Muscle Tone: within normal limits Gait & Station: Patient observed resting in bed Patient leans: N/A  Psychiatric Specialty Exam: Physical Exam Vitals reviewed.  Neurological:     Mental Status: He is alert.     Review of Systems  Psychiatric/Behavioral: Negative for hallucinations and suicidal ideas. The patient is nervous/anxious.   All other systems reviewed and are negative.   Blood pressure 132/66, pulse 67, temperature 97.9 F (36.6 C), temperature source Oral, resp. rate 18, height 5\' 10"  (1.778  m), weight 85 kg, SpO2 98 %.Body mass index is 26.89 kg/m.  General Appearance: Casual  Eye Contact:  Good  Speech:  Clear and Coherent  Volume:  Normal  Mood:  Anxious and Depressed  Affect:  Congruent  Thought Process:  Coherent  Orientation:  Full (Time, Place, and Person)  Thought Content:  Logical  Suicidal Thoughts:  No  Homicidal Thoughts:  No  Memory:  Immediate;   Fair Recent;   Fair  Judgement:  Fair  Insight:  Good  Psychomotor Activity:  Normal  Concentration:  Concentration: Fair  Recall:  [employer] of Knowledge:  Fair  Language:  Fair  Akathisia:  No  Handed:  Right  AIMS (if indicated):     Assets:  Desire for Improvement Physical Health  ADL's:  Intact  Cognition:  WNL  Sleep:        Treatment Plan Summary: Daily contact with patient to assess and evaluate symptoms and progress in treatment   -Continue melatonin 5 mg p.o. nightly as needed -Continue gabapentin 100 mg p.o. 3 times daily -[facility] outpatient [facility]  Disposition: No evidence of imminent risk to self or others  at present.   Patient does not meet criteria for psychiatric inpatient admission. Supportive therapy provided about ongoing stressors. Refer to IOP. Discussed crisis plan, support from social network, calling 911, coming to the Emergency Department, and calling Suicide Hotline.  Derrill Center, NP 03/23/2020 11:32 AM

## 2020-03-23 NOTE — Discharge Summary (Signed)
Physician Discharge Summary  Austin Norris YVO:[id number] DOB: 01/10/1956 DOA: 03/21/2020  PCP: Patient, No Pcp Per  Admit date: 03/21/2020 Discharge date: 03/23/2020  Admitted From: Home Disposition: Home Recommendations for Outpatient Follow-up:  1. Follow up with PCP in 1-2 weeks 2. Please obtain BMP/CBC in one week 3. Med changes made during this admission includes-I have stopped the hydrochlorothiazide due to soft blood pressure and hyponatremia sodium 132. 4. I have started him on trazodone 50 mg nightly as needed for sleep 5. I have given him prescriptions for levofloxacin for 3 more days after discussing with the on-call urologist.  [place] none Equipment/Devices: None Discharge Condition stable CODE STATUS: Full code Diet recommendation: Carb modified cardiac diet Brief/Interim Summary:Austin Norris  is a 64 y.o. male, with history of umbilical hernia, hypertension, hyperlipidemia, PUD, diabetes mellitus, and more presents to the ER with a chief complaint of altered mental status.  This is actually patient's second visit to the ER this week.  Patient was seen on the 23rd in the ER for the same complaint of altered mental status.  At that time he was just very emotional.  It was noted that he had had a prostate procedure done on November 8, and return to the office with dysuria frequency and urgency on November 17.  He was also running a fever at that time he was given gentamicin in the office and then a prescription for Levaquin due to concern for possible prostatitis.  He had been taking the medication as prescribed and when he returned to the office on the 19th his symptoms were improving so they advised him to continue Levaquin for 2 more weeks.  At this time he was also given some unknown shots in the office that the wife thinks may have been steroid.  Over the last 4 or 5 days wife reports that patient has had altered mental status that has been progressively worse over that  interval.  At first he was just very emotional, not able to sleep, stressed about the turmoil in his home country of [place], and was just not acting himself.  So at that visit on November 23 in the ER there was concern for antibiotic reaction versus his emotional state being secondary to insomnia.  He was prescribed hydroxyzine, and given a prescription for doxycycline for which she was told to switch to if getting a better night sleep with the hydroxyzine does not improve his symptoms.  Wife reports that patient continued to get worse, became confused which was new.  He started asking if he was dead or alive and saying he must be dead.  Talking about times in the past as if they were now.  And he became aggressive with his wife so she brought him back into the ER.  She reports that the only pain he has been complaining of his dysuria.  Wife does report that patient had a history of 1 episode of mania in 20.  She reports that it lasted several weeks but was self resolving and he never sought out treatment.  She reports that his son has a diagnosis of bipolar disease.  She would like patient to continue being seen by psych during his stay.  In the ER patient was very agitated, combative, very difficult to redirect.  He was given Geodon and Ativan.  At the time of my exam he was very somnolent and not able to give any history at all.  Wife provided all the history. Concern now  is for antibiotic reaction versus ongoing infection versus developing psychiatric problem.  In the ER Vitals stable No leukocytosis with a white blood cell count of 8.2 however there is a thrombocytosis at 435 Chemistry panel is unremarkable TSH is 0.709 UA from the 23rd shows greater than 500 glucose, large hemoglobin, 20 ketones, large leukocytes, greater than 50 white blood cells, and few bacteria Alcohol level was undetectable CT head was normal Levaquin was DC'd and Rocephin was started  Discharge Diagnoses:  Active  Problems:   Acute metabolic encephalopathy  #1 acute metabolic encephalopathy exact etiology is still unsure at the time of discharge.  It could have been due to ongoing prostatitis.  I have discussed with Dr. Alyson Ingles urologist who recommends to continue levofloxacin for 3 more days to finish a course of 21 days.  Follow-up with Dr. Claudia Desanctis upon discharge.  #2 mild hyponatremia I have stopped hydrochlorothiazide due to this reason and the fact that his blood pressure was very soft.  #3 insomnia discussed with patient his wife patient's nephew who is a Software engineer they all think that patient is not sleeping for days which is making him delirious.  I have given him a prescription for trazodone 50 mg nightly as needed.  #4 hypertension continue ARB and spironolactone I have stopped HCTZ.  #5 type 2 diabetes not well controlled follow-up with Dr. Dwyane Dee  Estimated body mass index is 26.89 kg/m as calculated from the following:   Height as of this encounter: 5\' 10"  (1.778 m).   Weight as of this encounter: 85 kg.  Discharge Instructions  Discharge Instructions    Diet - low sodium heart healthy   Complete by: As directed    Increase activity slowly   Complete by: As directed      Allergies as of 03/23/2020   No Known Allergies     Medication List    STOP taking these medications   doxycycline 100 MG capsule Commonly known as: VIBRAMYCIN   hydrOXYzine 25 MG tablet Commonly known as: ATARAX/VISTARIL   spironolactone-hydrochlorothiazide 25-25 MG tablet Commonly known as: ALDACTAZIDE     TAKE these medications   aspirin 81 MG chewable tablet Chew 1 tablet (81 mg total) by mouth daily.   atorvastatin 40 MG tablet Commonly known as: LIPITOR TAKE 1 TABLET BY MOUTH EVERY DAY   Contour Next Test test strip Generic drug: glucose blood USE TO CHECK BLOOD SUGAR TWICE A DAY   Dilt-XR 240 MG 24 hr capsule Generic drug: diltiazem TAKE 1 CAPSULE BY MOUTH EVERY DAY What changed: how  much to take   FreeStyle Libre 14 Day Sensor Misc USE EVERY 14 DAYS   gabapentin 100 MG capsule Commonly known as: NEURONTIN TAKE 1 CAPSULE 3 TIMES DAILY AS NEEDED WITH FOOD FOR SYMPTOM RELIEF What changed: See the new instructions.   INSULIN SYRINGE 1CC/31GX5/16" 31G X 5/16" 1 ML Misc Use to inject lantus daily   Invokana 300 MG Tabs tablet Generic drug: canagliflozin TAKE 1 TABLET BY MOUTH EVERY DAY BEFORE BREAKFAST What changed: See the new instructions.   levofloxacin 500 MG tablet Commonly known as: Levaquin Take 1 tablet (500 mg total) by mouth daily for 3 days.   metFORMIN 500 MG 24 hr tablet Commonly known as: GLUCOPHAGE-XR TAKE 1 TABLET BY MOUTH IN THE MORNING AND 2 TABLETS IN THE EVENING What changed: See the new instructions.   Microlet Lancets Misc Use to check blood sugar 2 times a day   NovoLOG FlexPen 100 UNIT/ML FlexPen Generic  drug: insulin aspart Inject 35 Units into the skin in the morning, at noon, and at bedtime.   olmesartan 40 MG tablet Commonly known as: BENICAR TAKE 1 TABLET daily, call to schedule follow up What changed:   how much to take  how to take this  when to take this  additional instructions   pantoprazole 40 MG tablet Commonly known as: PROTONIX Take 40 mg by mouth daily.   spironolactone 25 MG tablet Commonly known as: ALDACTONE Take 1 tablet (25 mg total) by mouth daily. Start taking on: March 24, 2020   traZODone 50 MG tablet Commonly known as: DESYREL Take 1 tablet (50 mg total) by mouth at bedtime.   Tyler Aas FlexTouch 200 UNIT/ML FlexTouch Pen Generic drug: insulin degludec INJECT 44 UNITS INTO SKIN DAILY What changed: See the new instructions.       No Known Allergies  Consultations:  Psychiatry   Procedures/Studies: CT Head Wo Contrast  Result Date: 03/20/2020 CLINICAL DATA:  Encephalopathy EXAM: CT HEAD WITHOUT CONTRAST TECHNIQUE: Contiguous axial images were obtained from the base of the  skull through the vertex without intravenous contrast. COMPARISON:  03/04/2019 FINDINGS: Brain: There is no mass, hemorrhage or extra-axial collection. The size and configuration of the ventricles and extra-axial CSF spaces are normal. The brain parenchyma is normal, without acute or chronic infarction. Vascular: No abnormal hyperdensity of the major intracranial arteries or dural venous sinuses. No intracranial atherosclerosis. Skull: The visualized skull base, calvarium and extracranial soft tissues are normal. Sinuses/Orbits: No fluid levels or advanced mucosal thickening of the visualized paranasal sinuses. No mastoid or middle ear effusion. The orbits are normal. IMPRESSION: Normal head CT. Electronically Signed   By: Ulyses Jarred M.D.   On: 03/20/2020 20:36    (Echo, Carotid, EGD, Colonoscopy, ERCP)    Subjective: Patient resting in bed wife by the bedside both of them have a lot of concerns and questions which were all answered to the best of my ability.  We talked about concerns and questions for about 25 to 30 minutes.  I also called his nephew who is a Software engineer in [place] and discussed with him with patient's permission.  Discharge Exam: Vitals:   03/23/20 0606 03/23/20 0747  BP: 115/69 132/66  Pulse: 95 67  Resp: 17 18  Temp: 98.4 F (36.9 C) 97.9 F (36.6 C)  SpO2: 97% 98%   Vitals:   03/22/20 1609 03/22/20 2109 03/23/20 0606 03/23/20 0747  BP: 127/88 (!) 115/59 115/69 132/66  Pulse: 78 71 95 67  Resp: 20 19 17 18   Temp: 98.9 F (37.2 C) 98.1 F (36.7 C) 98.4 F (36.9 C) 97.9 F (36.6 C)  TempSrc: Axillary Oral Oral Oral  SpO2: 100% 97% 97% 98%  Weight:      Height:        General: Pt is alert, awake, not in acute distress Cardiovascular: RRR, S1/S2 +, no rubs, no gallops Respiratory: CTA bilaterally, no wheezing, no rhonchi Abdominal: Soft, NT, ND, bowel sounds + Extremities: no edema, no cyanosis    The results of significant diagnostics from this  hospitalization (including imaging, microbiology, ancillary and laboratory) are listed below for reference.     Microbiology: Recent Results (from the past 240 hour(s))  Urine Culture     Status: Abnormal   Collection Time: 03/20/20  6:40 PM   Specimen: Urine, Random  Result Value Ref Range Status   Specimen Description URINE, RANDOM  Final   Special Requests NONE  Final  Culture (A)  Final    <10,000 COLONIES/mL INSIGNIFICANT GROWTH Performed at [facility] [address] [place], [place] [address]    Report Status 03/22/2020 FINAL  Final  Resp Panel by RT-PCR (Flu A&B, Covid) Nasopharyngeal Swab     Status: None   Collection Time: 03/22/20  2:46 AM   Specimen: Nasopharyngeal Swab; Nasopharyngeal(NP) swabs in vial transport medium  Result Value Ref Range Status   SARS Coronavirus 2 by RT PCR NEGATIVE NEGATIVE Final    Comment: (NOTE) SARS-CoV-2 target nucleic acids are NOT DETECTED.  The SARS-CoV-2 RNA is generally detectable in upper respiratory specimens during the acute phase of infection. The lowest concentration of SARS-CoV-2 viral copies this assay can detect is 138 copies/mL. A negative result does not preclude SARS-Cov-2 infection and should not be used as the sole basis for treatment or other patient management decisions. A negative result may occur with  improper specimen collection/handling, submission of specimen other than nasopharyngeal swab, presence of viral mutation(s) within the areas targeted by this assay, and inadequate number of viral copies(<138 copies/mL). A negative result must be combined with clinical observations, patient history, and epidemiological information. The expected result is Negative.  Fact Sheet for Patients:  EntrepreneurPulse.com.au  Fact Sheet for Healthcare Providers:  IncredibleEmployment.be  This test is no t yet approved or cleared by the [place] FDA and  has been authorized  for detection and/or diagnosis of SARS-CoV-2 by FDA under an Emergency Use Authorization (EUA). This EUA will remain  in effect (meaning this test can be used) for the duration of the COVID-19 declaration under Section 564(b)(1) of the Act, 21 U.S.C.section 360bbb-3(b)(1), unless the authorization is terminated  or revoked sooner.       Influenza A by PCR NEGATIVE NEGATIVE Final   Influenza B by PCR NEGATIVE NEGATIVE Final    Comment: (NOTE) The Xpert Xpress SARS-CoV-2/FLU/RSV plus assay is intended as an aid in the diagnosis of influenza from Nasopharyngeal swab specimens and should not be used as a sole basis for treatment. Nasal washings and aspirates are unacceptable for Xpert Xpress SARS-CoV-2/FLU/RSV testing.  Fact Sheet for Patients: EntrepreneurPulse.com.au  Fact Sheet for Healthcare Providers: IncredibleEmployment.be  This test is not yet approved or cleared by the [place] FDA and has been authorized for detection and/or diagnosis of SARS-CoV-2 by FDA under an Emergency Use Authorization (EUA). This EUA will remain in effect (meaning this test can be used) for the duration of the COVID-19 declaration under Section 564(b)(1) of the Act, 21 U.S.C. section 360bbb-3(b)(1), unless the authorization is terminated or revoked.  Performed at [facility], [place] [address], [place], [place] [address]      Labs: BNP (last 3 results) No results for input(s): BNP in the last 8760 hours. Basic Metabolic Panel: Recent Labs  Lab 03/20/20 2050 03/22/20 0246 03/23/20 0216  NA 132* 131* 132*  K 4.2 3.7 3.6  CL 98 97* 97*  CO2 18* 23 20*  GLUCOSE 142* 172* 95  BUN 23 24* 28*  CREATININE 1.35* 1.12 1.08  CALCIUM 9.9 9.2 9.0   Liver Function Tests: Recent Labs  Lab 03/20/20 2050 03/22/20 0246  AST 26 22  ALT 26 22  ALKPHOS 84 79  BILITOT 1.3* 0.6  PROT 7.9 6.8  ALBUMIN 4.1 3.5   No results for input(s): LIPASE, AMYLASE  in the last 168 hours. Recent Labs  Lab 03/22/20 0246  AMMONIA 27   CBC: Recent Labs  Lab 03/20/20 2050  03/22/20 0246  WBC 9.9 8.2  NEUTROABS 6.9 4.9  HGB 16.1 14.9  HCT 48.4 45.8  MCV 89.5 88.4  PLT 440* 435*   Cardiac Enzymes: No results for input(s): CKTOTAL, CKMB, CKMBINDEX, TROPONINI in the last 168 hours. BNP: Invalid input(s): POCBNP CBG: Recent Labs  Lab 03/22/20 1328 03/22/20 1613 03/22/20 2107 03/23/20 0752 03/23/20 1142  GLUCAP 121* 129* 245* 88 115*   D-Dimer No results for input(s): DDIMER in the last 72 hours. Hgb A1c Recent Labs    03/23/20 0216  HGBA1C 7.3*   Lipid Profile No results for input(s): CHOL, HDL, LDLCALC, TRIG, CHOLHDL, LDLDIRECT in the last 72 hours. Thyroid function studies Recent Labs    03/22/20 0246  TSH 0.709   Anemia work up No results for input(s): VITAMINB12, FOLATE, FERRITIN, TIBC, IRON, RETICCTPCT in the last 72 hours. Urinalysis    Component Value Date/Time   COLORURINE YELLOW 03/22/2020 0807   APPEARANCEUR CLOUDY (A) 03/22/2020 0807   LABSPEC 1.027 03/22/2020 0807   PHURINE 5.0 03/22/2020 0807   GLUCOSEU >=500 (A) 03/22/2020 0807   GLUCOSEU >=1000 (A) 07/12/2019 1442   HGBUR LARGE (A) 03/22/2020 0807   BILIRUBINUR NEGATIVE 03/22/2020 0807   KETONESUR NEGATIVE 03/22/2020 0807   PROTEINUR 30 (A) 03/22/2020 0807   UROBILINOGEN 0.2 07/12/2019 1442   NITRITE NEGATIVE 03/22/2020 0807   LEUKOCYTESUR MODERATE (A) 03/22/2020 0807   Sepsis Labs Invalid input(s): PROCALCITONIN,  WBC,  LACTICIDVEN Microbiology Recent Results (from the past 240 hour(s))  Urine Culture     Status: Abnormal   Collection Time: 03/20/20  6:40 PM   Specimen: Urine, Random  Result Value Ref Range Status   Specimen Description URINE, RANDOM  Final   Special Requests NONE  Final   Culture (A)  Final    <10,000 COLONIES/mL INSIGNIFICANT GROWTH Performed at [facility], [address], [place],  [address]    Report Status  03/22/2020 FINAL  Final  Resp Panel by RT-PCR (Flu A&B, Covid) Nasopharyngeal Swab     Status: None   Collection Time: 03/22/20  2:46 AM   Specimen: Nasopharyngeal Swab; Nasopharyngeal(NP) swabs in vial transport medium  Result Value Ref Range Status   SARS Coronavirus 2 by RT PCR NEGATIVE NEGATIVE Final    Comment: (NOTE) SARS-CoV-2 target nucleic acids are NOT DETECTED.  The SARS-CoV-2 RNA is generally detectable in upper respiratory specimens during the acute phase of infection. The lowest concentration of SARS-CoV-2 viral copies this assay can detect is 138 copies/mL. A negative result does not preclude SARS-Cov-2 infection and should not be used as the sole basis for treatment or other patient management decisions. A negative result may occur with  improper specimen collection/handling, submission of specimen other than nasopharyngeal swab, presence of viral mutation(s) within the areas targeted by this assay, and inadequate number of viral copies(<138 copies/mL). A negative result must be combined with clinical observations, patient history, and epidemiological information. The expected result is Negative.  Fact Sheet for Patients:  EntrepreneurPulse.com.au  Fact Sheet for Healthcare Providers:  IncredibleEmployment.be  This test is no t yet approved or cleared by the [place] FDA and  has been authorized for detection and/or diagnosis of SARS-CoV-2 by FDA under an Emergency Use Authorization (EUA). This EUA will remain  in effect (meaning this test can be used) for the duration of the COVID-19 declaration under Section 564(b)(1) of the Act, 21 U.S.C.section 360bbb-3(b)(1), unless the authorization is terminated  or revoked sooner.  Influenza A by PCR NEGATIVE NEGATIVE Final   Influenza B by PCR NEGATIVE NEGATIVE Final    Comment: (NOTE) The Xpert Xpress SARS-CoV-2/FLU/RSV plus assay is intended as an aid in the diagnosis of  influenza from Nasopharyngeal swab specimens and should not be used as a sole basis for treatment. Nasal washings and aspirates are unacceptable for Xpert Xpress SARS-CoV-2/FLU/RSV testing.  Fact Sheet for Patients: EntrepreneurPulse.com.au  Fact Sheet for Healthcare Providers: IncredibleEmployment.be  This test is not yet approved or cleared by the [place] FDA and has been authorized for detection and/or diagnosis of SARS-CoV-2 by FDA under an Emergency Use Authorization (EUA). This EUA will remain in effect (meaning this test can be used) for the duration of the COVID-19 declaration under Section 564(b)(1) of the Act, 21 U.S.C. section 360bbb-3(b)(1), unless the authorization is terminated or revoked.  Performed at [facility], [place] [address], [place], [place] [address]      Time coordinating discharge:  38 minutes  SIGNED:   Georgette Shell, MD  [facility] 03/23/2020, 1:02 PM

## 2020-03-23 NOTE — Progress Notes (Signed)
Reached out to [employer] (Psych) requesting they call me, as pt. And wife have not heard from them re: psych. Consult.

## 2020-03-23 NOTE — Plan of Care (Signed)
Patient is sitting in room with his wife bedside.  Discussed in detail his medications, exercise and follow up with psychiatry and his surgeon out patient.  Patient and his wife verbalized an understanding of discharge instructions.

## 2020-03-24 ENCOUNTER — Other Ambulatory Visit: Payer: Self-pay | Admitting: Endocrinology

## 2020-03-27 ENCOUNTER — Other Ambulatory Visit: Payer: Self-pay | Admitting: *Deleted

## 2020-03-27 ENCOUNTER — Other Ambulatory Visit: Payer: Self-pay | Admitting: Endocrinology

## 2020-03-27 LAB — CULTURE, BLOOD (ROUTINE X 2)
Culture: NO GROWTH
Culture: NO GROWTH

## 2020-03-27 MED ORDER — NOVOLOG FLEXPEN 100 UNIT/ML ~~LOC~~ SOPN
35.0000 [IU] | PEN_INJECTOR | Freq: Three times a day (TID) | SUBCUTANEOUS | 0 refills | Status: DC
Start: 2020-03-27 — End: 2020-04-02

## 2020-03-28 DIAGNOSIS — G4700 Insomnia, unspecified: Secondary | ICD-10-CM | POA: Diagnosis not present

## 2020-03-28 DIAGNOSIS — F419 Anxiety disorder, unspecified: Secondary | ICD-10-CM | POA: Diagnosis not present

## 2020-03-29 DIAGNOSIS — N410 Acute prostatitis: Secondary | ICD-10-CM | POA: Diagnosis not present

## 2020-04-01 ENCOUNTER — Other Ambulatory Visit: Payer: Self-pay | Admitting: Endocrinology

## 2020-04-02 ENCOUNTER — Encounter: Payer: Self-pay | Admitting: Endocrinology

## 2020-04-02 ENCOUNTER — Ambulatory Visit (INDEPENDENT_AMBULATORY_CARE_PROVIDER_SITE_OTHER): Payer: 59 | Admitting: Endocrinology

## 2020-04-02 ENCOUNTER — Other Ambulatory Visit: Payer: Self-pay

## 2020-04-02 VITALS — BP 144/72 | HR 91 | Ht 63.0 in | Wt 181.2 lb

## 2020-04-02 DIAGNOSIS — E1165 Type 2 diabetes mellitus with hyperglycemia: Secondary | ICD-10-CM

## 2020-04-02 DIAGNOSIS — I10 Essential (primary) hypertension: Secondary | ICD-10-CM | POA: Diagnosis not present

## 2020-04-02 DIAGNOSIS — Z794 Long term (current) use of insulin: Secondary | ICD-10-CM | POA: Diagnosis not present

## 2020-04-02 DIAGNOSIS — E7800 Pure hypercholesterolemia, unspecified: Secondary | ICD-10-CM

## 2020-04-02 LAB — COMPREHENSIVE METABOLIC PANEL
ALT: 16 U/L (ref 0–53)
AST: 18 U/L (ref 0–37)
Albumin: 4.4 g/dL (ref 3.5–5.2)
Alkaline Phosphatase: 84 U/L (ref 39–117)
BUN: 17 mg/dL (ref 6–23)
CO2: 28 mEq/L (ref 19–32)
Calcium: 9.5 mg/dL (ref 8.4–10.5)
Chloride: 100 mEq/L (ref 96–112)
Creatinine, Ser: 1.02 mg/dL (ref 0.40–1.50)
GFR: 77.33 mL/min (ref >60.00)
Glucose, Bld: 183 mg/dL — ABNORMAL HIGH (ref 70–99)
Potassium: 4.7 mEq/L (ref 3.5–5.1)
Sodium: 136 mEq/L (ref 135–145)
Total Bilirubin: 0.3 mg/dL (ref 0.2–1.2)
Total Protein: 7.6 g/dL (ref 6.0–8.3)

## 2020-04-02 LAB — LIPID PANEL
Cholesterol: 151 mg/dL (ref 0–200)
HDL: 44.4 mg/dL (ref >39.00)
LDL Cholesterol: 85 mg/dL (ref 0–99)
NonHDL: 106.35
Total CHOL/HDL Ratio: 3
Triglycerides: 107 mg/dL (ref 0.0–149.0)
VLDL: 21.4 mg/dL (ref 0.0–40.0)

## 2020-04-02 NOTE — Progress Notes (Signed)
Patient ID: Austin Norris, male   DOB: 1955/07/20, 64 y.o.   MRN: [id number]   Reason for Appointment: Follow-up   History of Present Illness   Diagnosis: Type 2 DIABETES MELITUS, long-standing     He has been on insulin to treat his diabetes for several years partly because of failure of oral agents and also for reducing cost of medications He is generally checking his blood sugars once or twice a day but does not keep a record Overall has had somewhat poor control with A1c consistently over 7%  Recent history:    Insulin regimen: Tresiba 25 units at bedtime, Novolog usually 10 --5/10 20 at dinner    Oral hypoglycemic drugs: Invokana 300mg , metformin ER 1 g daily  A1c is 7.3, was 7.2  Current blood sugar patterns, management and problems identified:   He said that he has been eating smaller portions since his prostatitis and issues with recent hospitalization  with this he has been taking about half the usual dose of [place]  He is not sure how to adjust his Tyler Aas and is taking 20-25 units based on his bedtime reading  Also is only adjusting his NovoLog based on Premeal blood sugar rather than what he is eating  also is afraid of low sugars overnight even though he has the alert on the freestyle libre but will sometimes not take his NovoLog at dinnertime including last night when blood sugars are near normal  Blood sugar patterns from his Elenor Legato are interpreted as follows   He started using his Elenor Legato only after his discharge on 11/27  Blood sugar patterns show an average blood sugar to be mostly in the 120-140 range on an average but increased variability late at night and after bedtime  HYPERGLYCEMIC episodes.  Today is consistently occurring after various meals and also occasionally overnight  Overnight blood sugars are highly variable with tendency to mild hypoglycemia on 12/3 and also 11/28 after his return from the hospital  HIGHEST blood sugar was  elevated after breakfast/midday and occasionally after dinner  Postprandial readings as above are inconsistent and average blood sugar is under 170 after meals      Side effects from medications: None  Proper timing of medications in relation to meals: Yes.          Monitors blood glucose:  freestyle libre version 2, this reportedly is 10-20 mg lower than actual readings  CGM use % of time  63  2-week average/SD  144, GV 33  Time in range  74     %  % Time Above 180  21  % Time above 250   % Time Below 70  5     PRE-MEAL Fasting Lunch Dinner Bedtime Overall  Glucose range:       Averages:  129   123  136  144   POST-MEAL PC Breakfast PC Lunch PC Dinner  Glucose range:     Averages:  166   166    PREVIOUS data:    CGM use % of time  95  Average and SD  150, GV 30  Time in range  76     %  % Time Above 180  21  % Time above 250   % Time Below target 1    PRE-MEAL Fasting Lunch Dinner Bedtime Overall  Glucose range:       Mean/median:  157  167  133  150  150   POST-MEAL PC Breakfast PC Lunch PC Dinner  Glucose range:     Mean/median:  158  143  141      Meals: 2-3  meals per day, usually some form of bread and milk in the morning, relatively smaller lunch, sometimes having eggs at lunchtime., sometimes sweets in pm, fried food occasionally        Dinner usually 5-6 PM   Physical activity: exercise: Only with walking at work up to 7 days a week, is active in the parking lot of his car [place] visit: Most recent: None             Wt Readings from Last 3 Encounters:  04/02/20 181 lb 3.2 oz (82.2 kg)  03/22/20 187 lb 6.3 oz (85 kg)  03/20/20 186 lb (84.4 kg)   LABS:  Lab Results  Component Value Date   HGBA1C 7.3 (H) 03/23/2020   HGBA1C 7.2 (H) 11/18/2019   HGBA1C 7.0 (H) 07/12/2019   Lab Results  Component Value Date   MICROALBUR 1.2 07/12/2019   LDLCALC 79 07/12/2019   CREATININE 1.08 03/23/2020    Other active  problems: See review of systems   Allergies as of 04/02/2020   No Known Allergies     Medication List       Accurate as of April 02, 2020 10:29 AM. If you have any questions, ask your nurse or doctor.        aspirin 81 MG chewable tablet Chew 1 tablet (81 mg total) by mouth daily.   atorvastatin 40 MG tablet Commonly known as: LIPITOR Take 1 tablet (40 mg total) by mouth daily. Needs appointment for further refills   Contour Next Test test strip Generic drug: glucose blood USE TO CHECK BLOOD SUGAR TWICE A DAY   Dilt-XR 240 MG 24 hr capsule Generic drug: diltiazem TAKE 1 CAPSULE BY MOUTH EVERY DAY What changed: how much to take   FreeStyle Libre 14 Day Sensor Misc USE EVERY 14 DAYS   gabapentin 100 MG capsule Commonly known as: NEURONTIN TAKE 1 CAPSULE 3 TIMES DAILY AS NEEDED WITH FOOD FOR SYMPTOM RELIEF   INSULIN SYRINGE 1CC/31GX5/16" 31G X 5/16" 1 ML Misc Use to inject lantus daily   Invokana 300 MG Tabs tablet Generic drug: canagliflozin TAKE 1 TABLET BY MOUTH EVERY DAY BEFORE BREAKFAST What changed: See the new instructions.   metFORMIN 500 MG 24 hr tablet Commonly known as: GLUCOPHAGE-XR TAKE 1 TABLET BY MOUTH IN THE MORNING AND 2 TABLETS IN THE EVENING What changed: See the new instructions.   Microlet Lancets Misc Use to check blood sugar 2 times a day   NovoLOG FlexPen 100 UNIT/ML FlexPen Generic drug: insulin aspart INJECT 35 UNITS INTO THE SKIN IN THE MORNING, AT NOON, AND AT BEDTIME. What changed: how much to take   olmesartan 40 MG tablet Commonly known as: BENICAR TAKE 1 TABLET DAILY, CALL TO SCHEDULE FOLLOW UP   pantoprazole 40 MG tablet Commonly known as: PROTONIX Take 40 mg by mouth daily.   spironolactone 25 MG tablet Commonly known as: ALDACTONE Take 1 tablet (25 mg total) by mouth daily.   traZODone 50 MG tablet Commonly known as: DESYREL Take 1 tablet (50 mg total) by mouth at bedtime.   Tyler Aas FlexTouch 200 UNIT/ML  FlexTouch Pen Generic drug: insulin degludec INJECT 44 UNITS INTO SKIN DAILY What changed: See the new instructions.  Allergies: No Known Allergies  Past Medical History:  Diagnosis Date  . Diabetes mellitus   . H/O epistaxis   . History of stomach ulcers    30 YRS AGO  . Hypercholesteremia   . Hypertension   . Shoulder pain, left   . Umbilical hernia     Past Surgical History:  Procedure Laterality Date  . CARDIAC CATHETERIZATION  1997  . COLONOSCOPY    . UMBILICAL HERNIA REPAIR N/A 07/11/2015   Procedure: LAPAROSCOPIC ASSISTED OPEN UMBILICAL HERNIA;  Surgeon: Johnathan Hausen, MD;  Location: [facility];  Service: General;  Laterality: N/A;  With MESH    Family History  Problem Relation Age of Onset  . Heart disease Mother   . Diabetes Father   . Diabetes Sister    No colon cancer  Social History:  reports that he quit smoking about 22 years ago. He has never used smokeless tobacco. He reports that he does not drink alcohol and does not use drugs.  Review of Systems:   HYPERTENSION:  he has had long-standing hypertension Is  taking Aldactone, diltiazem and Benicar 40 mg  Amlodipine was changed to diltiazem because of swelling of his legs  Had been on Aldactazide since 02/2019 and blood pressure is better controlled Recently during hospitalization because of slightly low sodium HCTZ was dropped  Is also on Invokana  BP Readings from Last 3 Encounters:  04/02/20 (!) 144/72  03/23/20 132/66  03/20/20 (!) 150/60   Lab Results  Component Value Date   CREATININE 1.08 03/23/2020   BUN 28 (H) 03/23/2020   NA 132 (L) 03/23/2020   K 3.6 03/23/2020   CL 97 (L) 03/23/2020   CO2 20 (L) 03/23/2020     HYPERLIPIDEMIA: The lipid abnormality consists of elevated LDL.    He  is taking his Lipitor 20 mg daily Last LDL was fairly good   Has had some evidence of CAD in the past but asymptomatic    Lab Results  Component Value Date   CHOL 157 07/12/2019   CHOL  218 (H) 05/09/2019   CHOL 157 07/05/2018   Lab Results  Component Value Date   HDL 47.20 07/12/2019   HDL 56.40 05/09/2019   HDL 59.20 07/05/2018   Lab Results  Component Value Date   LDLCALC 79 07/12/2019   LDLCALC 137 (H) 05/09/2019   LDLCALC 83 07/05/2018   Lab Results  Component Value Date   TRIG 157.0 (H) 07/12/2019   TRIG 125.0 05/09/2019   TRIG 75.0 07/05/2018   Lab Results  Component Value Date   CHOLHDL 3 07/12/2019   CHOLHDL 4 05/09/2019   CHOLHDL 3 07/05/2018   Lab Results  Component Value Date   LDLDIRECT 89.8 12/29/2013     He has had some burning and pains in his lower legs and feet somewhat better with gabapentin that was just started     Examination:   BP (!) 144/72   Pulse 91   Ht 5\' 3"  (1.6 m)   Wt 181 lb 3.2 oz (82.2 kg)   SpO2 95%   BMI 32.10 kg/m   Body mass index is 32.1 kg/m.     ASSESSMENT/ PLAN:   Diabetes type 2 insulin-dependent  See history of present illness for detailed discussion of his current management, blood sugar patterns and problems identified  His A1c is 7.3  As above his recent blood sugars are averaging about the same as before Blood sugars are assessed from his freestyle [employer] and  interpretation has been done about Freestyle Elenor Legato is sometimes lower than actual reading and not clear if he is truly getting hypoglycemia  Some of his blood sugar fluctuation is related to inconsistent regimen of insulin with fear of hypoglycemia He is not adjusting his mealtime dose based on what he is eating but mostly on Premeal blood sugar Also adjusting Tresiba mostly based on bedtime reading Recently with decreased intake overall he is likely needing less insulin  HYPERTENSION: Consistently well controlled with current regimen  Currently on Aldactone and HCTZ was stopped because of low sodium    RECOMMENDATIONS:  Since he has variable overnight blood sugars he will need to adjust his Tyler Aas based on his  fasting readings every 3 days  We will need to make sure he is also comparing his blood sugars with fingersticks  Given him flowsheet to adjust the [place] based on the morning readings with a target of 90-134  He will take between 10-20 units at mealtimes based on whether he is having small medium or large amounts of carbohydrates such as rice or bread  His wife was present today and understood instructions  He will also be seeing the dietitian  Resume exercise when able to   Hypercholesterolemia: LDL will be rechecked  He is going to the urgent care center for his left foot sprain  Continue gabapentin for neuropathic symptoms and can can take it as needed   Patient Instructions  Tresiba 22 daily  Novolog 10-20 before the meals  Check blood sugars on waking up 7 days a week  Also check blood sugars about 2 hours after meals daily and do this after different meals by rotation  Recommended blood sugar levels on waking up are 90-130 and about 2 hours after meal is 130-160  Please bring your blood sugar monitor to each visit, thank you        Elayne Snare 04/02/2020, 10:29 AM   Note: This office note was prepared with Dragon voice recognition system technology. Any transcriptional errors that result from this process are unintentional.

## 2020-04-02 NOTE — Patient Instructions (Signed)
Tresiba 22 daily  Novolog 10-20 before the meals  Check blood sugars on waking up 7 days a week  Also check blood sugars about 2 hours after meals daily and do this after different meals by rotation  Recommended blood sugar levels on waking up are 90-130 and about 2 hours after meal is 130-160  Please bring your blood sugar monitor to each visit, thank you

## 2020-04-04 DIAGNOSIS — F419 Anxiety disorder, unspecified: Secondary | ICD-10-CM | POA: Diagnosis not present

## 2020-04-04 DIAGNOSIS — G4700 Insomnia, unspecified: Secondary | ICD-10-CM | POA: Diagnosis not present

## 2020-04-05 DIAGNOSIS — N410 Acute prostatitis: Secondary | ICD-10-CM | POA: Diagnosis not present

## 2020-04-11 ENCOUNTER — Other Ambulatory Visit: Payer: Self-pay | Admitting: Endocrinology

## 2020-04-17 DIAGNOSIS — F419 Anxiety disorder, unspecified: Secondary | ICD-10-CM | POA: Diagnosis not present

## 2020-04-17 DIAGNOSIS — G4700 Insomnia, unspecified: Secondary | ICD-10-CM | POA: Diagnosis not present

## 2020-04-18 ENCOUNTER — Other Ambulatory Visit: Payer: Self-pay | Admitting: Endocrinology

## 2020-04-23 ENCOUNTER — Other Ambulatory Visit: Payer: Self-pay | Admitting: Endocrinology

## 2020-04-26 DIAGNOSIS — N401 Enlarged prostate with lower urinary tract symptoms: Secondary | ICD-10-CM | POA: Diagnosis not present

## 2020-04-26 DIAGNOSIS — R3912 Poor urinary stream: Secondary | ICD-10-CM | POA: Diagnosis not present

## 2020-04-26 DIAGNOSIS — R8279 Other abnormal findings on microbiological examination of urine: Secondary | ICD-10-CM | POA: Diagnosis not present

## 2020-05-02 DIAGNOSIS — N400 Enlarged prostate without lower urinary tract symptoms: Secondary | ICD-10-CM | POA: Diagnosis not present

## 2020-05-02 DIAGNOSIS — I10 Essential (primary) hypertension: Secondary | ICD-10-CM | POA: Diagnosis not present

## 2020-05-02 DIAGNOSIS — Z794 Long term (current) use of insulin: Secondary | ICD-10-CM | POA: Diagnosis not present

## 2020-05-02 DIAGNOSIS — Z23 Encounter for immunization: Secondary | ICD-10-CM | POA: Diagnosis not present

## 2020-05-02 DIAGNOSIS — E785 Hyperlipidemia, unspecified: Secondary | ICD-10-CM | POA: Diagnosis not present

## 2020-05-02 DIAGNOSIS — E1169 Type 2 diabetes mellitus with other specified complication: Secondary | ICD-10-CM | POA: Diagnosis not present

## 2020-05-15 ENCOUNTER — Other Ambulatory Visit: Payer: Self-pay | Admitting: Endocrinology

## 2020-05-17 ENCOUNTER — Other Ambulatory Visit: Payer: Self-pay

## 2020-05-21 ENCOUNTER — Other Ambulatory Visit: Payer: Self-pay

## 2020-05-21 ENCOUNTER — Other Ambulatory Visit (INDEPENDENT_AMBULATORY_CARE_PROVIDER_SITE_OTHER): Payer: Medicare HMO

## 2020-05-21 DIAGNOSIS — Z794 Long term (current) use of insulin: Secondary | ICD-10-CM

## 2020-05-21 DIAGNOSIS — E1165 Type 2 diabetes mellitus with hyperglycemia: Secondary | ICD-10-CM

## 2020-05-21 LAB — BASIC METABOLIC PANEL
BUN: 18 mg/dL (ref 6–23)
CO2: 25 mEq/L (ref 19–32)
Calcium: 9.7 mg/dL (ref 8.4–10.5)
Chloride: 104 mEq/L (ref 96–112)
Creatinine, Ser: 0.91 mg/dL (ref 0.40–1.50)
GFR: 88.6 mL/min (ref >60.00)
Glucose, Bld: 89 mg/dL (ref 70–99)
Potassium: 4.3 mEq/L (ref 3.5–5.1)
Sodium: 137 mEq/L (ref 135–145)

## 2020-05-21 LAB — MICROALBUMIN / CREATININE URINE RATIO
Creatinine,U: 73.9 mg/dL
Microalb Creat Ratio: 2.1 mg/g (ref 0.0–30.0)
Microalb, Ur: 1.5 mg/dL (ref 0.0–1.9)

## 2020-05-21 LAB — HEMOGLOBIN A1C: Hgb A1c MFr Bld: 6.1 % (ref 4.6–6.5)

## 2020-05-25 ENCOUNTER — Encounter: Payer: Self-pay | Admitting: Endocrinology

## 2020-05-25 ENCOUNTER — Other Ambulatory Visit: Payer: Self-pay

## 2020-05-25 ENCOUNTER — Ambulatory Visit (INDEPENDENT_AMBULATORY_CARE_PROVIDER_SITE_OTHER): Payer: Medicare HMO | Admitting: Endocrinology

## 2020-05-25 VITALS — BP 130/66 | HR 62 | Ht 65.0 in | Wt 187.6 lb

## 2020-05-25 DIAGNOSIS — E1165 Type 2 diabetes mellitus with hyperglycemia: Secondary | ICD-10-CM

## 2020-05-25 DIAGNOSIS — E7800 Pure hypercholesterolemia, unspecified: Secondary | ICD-10-CM | POA: Diagnosis not present

## 2020-05-25 DIAGNOSIS — Z794 Long term (current) use of insulin: Secondary | ICD-10-CM | POA: Diagnosis not present

## 2020-05-25 DIAGNOSIS — I10 Essential (primary) hypertension: Secondary | ICD-10-CM

## 2020-05-25 NOTE — Progress Notes (Signed)
Patient ID: Austin Norris, male   DOB: 20-Apr-1956, 65 y.o.   MRN: [id number]   Reason for Appointment: Follow-up   History of Present Illness   Diagnosis: Type 2 DIABETES MELITUS, long-standing     He has been on insulin to treat his diabetes for several years partly because of failure of oral agents and also for reducing cost of medications He is generally checking his blood sugars once or twice a day but does not keep a record Overall has had somewhat poor control with A1c consistently over 7%  Recent history:    Insulin regimen: Tresiba 24 units at bedtime, Novolog usually 20 units before meals  Oral hypoglycemic drugs: Invokana 300mg , metformin ER 1 g daily  A1c is 6.1 compared to 7.3  Current blood sugar patterns, management and problems identified:   He has continued to benefit from the freestyle Tool with better adjustment of his mealtime doses  Since he is not quite as active during the daytime he is able to take NovoLog to cover all his meals without fear of hypoglycemia  However he is not adjusting his NovoLog based on what he is eating and is taking a flat dose of 20 units on the time  Some of his high readings in the late afternoon or evening may be possibly from missing his insulin doses before meals or excessive carbohydrate  Otherwise has only hyperglycemic pattern is usually around 6 PM  Overnight blood sugars are stable with 24 Tresiba and he has not changed the dose since last visit  Has been consistent with Invokana and Metformin   Blood sugars are showing fairly good control with most of the time except variable tendency to hyperglycemia late afternoon or early evenings but this is not consistent  No HYPOGLYCEMIA  Blood sugars may tend to be in low normal before lunchtime  POSTPRANDIAL readings are excellent after breakfast and frequently after lunch also; that are available incompletely for evening meal on a couple of days  After  dinner blood sugars are mostly level with preprandial readings with highest readings on 1/22 and 1/24  Overnight blood sugars are relatively stable averaging between 130-150    Does not do much formal exercise, only some walking at work    Side effects from medications: None  Proper timing of medications in relation to meals: Yes.          Monitors blood glucose:  freestyle libre version 2, this reportedly is 10-20 mg lower than actual readings  CGM use % of time  64  2-week average/GV  149/24  Time in range     85  %  % Time Above 180  13  % Time above 250 2  % Time Below 70 0     PRE-MEAL Fasting Lunch Dinner Bedtime Overall  Glucose range:       Averages:  130  140  132  157    POST-MEAL PC Breakfast PC Lunch PC Dinner  Glucose range:     Averages:  146  140  180    Previous data:   CGM use % of time  63  2-week average/SD  144, GV 33  Time in range  74     %  % Time Above 180  21  % Time above 250   % Time Below 70  5     PRE-MEAL Fasting Lunch Dinner Bedtime Overall  Glucose range:  Averages:  129   123  136  144   POST-MEAL PC Breakfast PC Lunch PC Dinner  Glucose range:     Averages:  166   166      Meals: 2-3  meals per day, usually some form of bread and milk in the morning, relatively smaller lunch, sometimes having eggs at lunchtime., sometimes sweets in pm, fried food occasionally        Dinner usually 5-6 PM         Dietician visit: Most recent: None             Wt Readings from Last 3 Encounters:  05/25/20 187 lb 9.6 oz (85.1 kg)  04/02/20 181 lb 3.2 oz (82.2 kg)  03/22/20 187 lb 6.3 oz (85 kg)   LABS:  Lab Results  Component Value Date   HGBA1C 6.1 05/21/2020   HGBA1C 7.3 (H) 03/23/2020   HGBA1C 7.2 (H) 11/18/2019   Lab Results  Component Value Date   MICROALBUR 1.5 05/21/2020   LDLCALC 85 04/02/2020   CREATININE 0.91 05/21/2020    Other active problems: See review of systems   Allergies as of 05/25/2020   No Known  Allergies     Medication List       Accurate as of May 25, 2020 10:30 AM. If you have any questions, ask your nurse or doctor.        aspirin 81 MG chewable tablet Chew 1 tablet (81 mg total) by mouth daily.   atorvastatin 40 MG tablet Commonly known as: LIPITOR Take 1 tablet (40 mg total) by mouth daily. Needs appointment for further refills   Contour Next Test test strip Generic drug: glucose blood USE TO CHECK BLOOD SUGAR TWICE A DAY   Dilt-XR 240 MG 24 hr capsule Generic drug: diltiazem TAKE 1 CAPSULE BY MOUTH EVERY DAY What changed: how much to take   FreeStyle Libre 14 Day Sensor Misc USE EVERY 14 DAYS   gabapentin 100 MG capsule Commonly known as: NEURONTIN TAKE 1 CAPSULE 3 TIMES DAILY AS NEEDED WITH FOOD FOR SYMPTOM RELIEF   INSULIN SYRINGE 1CC/31GX5/16" 31G X 5/16" 1 ML Misc Use to inject lantus daily   Invokana 300 MG Tabs tablet Generic drug: canagliflozin TAKE 1 TABLET BY MOUTH EVERY DAY BEFORE BREAKFAST What changed: See the new instructions.   metFORMIN 500 MG 24 hr tablet Commonly known as: GLUCOPHAGE-XR TAKE 1 TABLET BY MOUTH IN THE MORNING AND 2 TABLETS IN THE EVENING   Microlet Lancets Misc Use to check blood sugar 2 times a day   NovoLOG FlexPen 100 UNIT/ML FlexPen Generic drug: insulin aspart INJECT 35 UNITS INTO THE SKIN IN THE MORNING, AT NOON, AND AT BEDTIME. What changed: how much to take   olmesartan 40 MG tablet Commonly known as: BENICAR TAKE 1 TABLET BY MOUTH EVERY DAY CALL TO SCHEDULE FOLLOW UP   pantoprazole 40 MG tablet Commonly known as: PROTONIX Take 40 mg by mouth daily.   spironolactone 25 MG tablet Commonly known as: ALDACTONE Take 1 tablet (25 mg total) by mouth daily.   traZODone 50 MG tablet Commonly known as: DESYREL Take 1 tablet (50 mg total) by mouth at bedtime.   Tyler Aas FlexTouch 200 UNIT/ML FlexTouch Pen Generic drug: insulin degludec INJECT 44 UNITS INTO SKIN DAILY What changed: See the new  instructions.       Allergies: No Known Allergies  Past Medical History:  Diagnosis Date  . Diabetes mellitus   . H/O epistaxis   .  History of stomach ulcers    30 YRS AGO  . Hypercholesteremia   . Hypertension   . Shoulder pain, left   . Umbilical hernia     Past Surgical History:  Procedure Laterality Date  . CARDIAC CATHETERIZATION  1997  . COLONOSCOPY    . UMBILICAL HERNIA REPAIR N/A 07/11/2015   Procedure: LAPAROSCOPIC ASSISTED OPEN UMBILICAL HERNIA;  Surgeon: Johnathan Hausen, MD;  Location: [facility];  Service: General;  Laterality: N/A;  With MESH    Family History  Problem Relation Age of Onset  . Heart disease Mother   . Diabetes Father   . Diabetes Sister    No colon cancer  Social History:  reports that he quit smoking about 23 years ago. He has never used smokeless tobacco. He reports that he does not drink alcohol and does not use drugs.  Review of Systems:   HYPERTENSION:  he has had long-standing hypertension Is  taking Aldactone, diltiazem 240 and Benicar 40 mg  Amlodipine was changed to diltiazem because of swelling of his legs  Had been on Aldactone but now on spironolactone 25 mg only since late 2021 because of hyponatremia with HCTZ  He says blood pressure at home is usually around 742 average systolic but he is asking about low normal diastolic readings in the 59D  Is also on Invokana  BP Readings from Last 3 Encounters:  05/25/20 130/66  04/02/20 (!) 144/72  03/23/20 132/66   Lab Results  Component Value Date   CREATININE 0.91 05/21/2020   BUN 18 05/21/2020   NA 137 05/21/2020   K 4.3 05/21/2020   CL 104 05/21/2020   CO2 25 05/21/2020     HYPERLIPIDEMIA: The lipid abnormality consists of elevated LDL.    He  is taking his Lipitor 20 mg daily Last LDL was fairly good   Has had some evidence of CAD in the past but asymptomatic    Lab Results  Component Value Date   CHOL 151 04/02/2020   CHOL 157 07/12/2019   CHOL 218 (H)  05/09/2019   Lab Results  Component Value Date   HDL 44.40 04/02/2020   HDL 47.20 07/12/2019   HDL 56.40 05/09/2019   Lab Results  Component Value Date   LDLCALC 85 04/02/2020   LDLCALC 79 07/12/2019   LDLCALC 137 (H) 05/09/2019   Lab Results  Component Value Date   TRIG 107.0 04/02/2020   TRIG 157.0 (H) 07/12/2019   TRIG 125.0 05/09/2019   Lab Results  Component Value Date   CHOLHDL 3 04/02/2020   CHOLHDL 3 07/12/2019   CHOLHDL 4 05/09/2019   Lab Results  Component Value Date   LDLDIRECT 89.8 12/29/2013     He has had burning and pains in his lower legs and feet improved with gabapentin  He is asking about other medical issues such as a broken fingernail  He has had 3 injections of the COVID vaccines      Examination:   BP 130/66   Pulse 62   Ht 5\' 5"  (1.651 m)   Wt 187 lb 9.6 oz (85.1 kg)   SpO2 97%   BMI 31.22 kg/m   Body mass index is 31.22 kg/m.     ASSESSMENT/ PLAN:   Diabetes type 2 insulin-dependent  See history of present illness for detailed discussion of his current management, blood sugar patterns and problems identified  His A1c is 6.2 compared to 7.3  His blood sugars are dramatically better as per A1c  although home blood sugars recently are still showing a GMI of 6.9  Blood sugars are assessed from his freestyle [employer] Had a couple of high readings after meals slightly in the afternoon or evening possibly from missing premeal insulin Currently not adjusting mealtime dose based on what he is eating Overnight blood sugars fairly good with current dose of [place] Also not exercising enough  HYPERTENSION: Consistently well controlled with current regimen  Currently on Aldactone which is working fairly well now and no hyponatremia, also no change in potassium recently on renal function  Microalbumin normal  RECOMMENDATIONS:  Since he may occasionally have low normal readings before lunch he can reduce morning NovoLog to 16  usually unless eating carbohydrate which is inconsistent  Make sure he takes NovoLog before each meal regardless of Premeal blood sugar  No change in the [place] unless morning sugars are consistently high  Continue Invokana 300 mg  Regular walking for exercise   Hypercholesterolemia: LDL excellent on the last visit   There are no Patient Instructions on file for this visit.     Elayne Snare 05/25/2020, 10:30 AM   Note: This office note was prepared with Dragon voice recognition system technology. Any transcriptional errors that result from this process are unintentional.

## 2020-05-25 NOTE — Patient Instructions (Signed)
Check blood sugars on waking up 7 days a week  Also check blood sugars about 2 hours after meals and do this after different meals by rotation  Recommended blood sugar levels on waking up are 90-130 and about 2 hours after meal is 130-160  Please bring your blood sugar monitor to each visit, thank you  Novolog 16 units before breakfast

## 2020-05-30 ENCOUNTER — Telehealth: Payer: Self-pay | Admitting: Endocrinology

## 2020-05-30 ENCOUNTER — Other Ambulatory Visit: Payer: Self-pay | Admitting: *Deleted

## 2020-05-30 DIAGNOSIS — E1169 Type 2 diabetes mellitus with other specified complication: Secondary | ICD-10-CM | POA: Diagnosis not present

## 2020-05-30 DIAGNOSIS — K429 Umbilical hernia without obstruction or gangrene: Secondary | ICD-10-CM | POA: Diagnosis not present

## 2020-05-30 DIAGNOSIS — Z0000 Encounter for general adult medical examination without abnormal findings: Secondary | ICD-10-CM | POA: Diagnosis not present

## 2020-05-30 DIAGNOSIS — Z794 Long term (current) use of insulin: Secondary | ICD-10-CM | POA: Diagnosis not present

## 2020-05-30 DIAGNOSIS — I10 Essential (primary) hypertension: Secondary | ICD-10-CM | POA: Diagnosis not present

## 2020-05-30 DIAGNOSIS — E785 Hyperlipidemia, unspecified: Secondary | ICD-10-CM | POA: Diagnosis not present

## 2020-05-30 DIAGNOSIS — N400 Enlarged prostate without lower urinary tract symptoms: Secondary | ICD-10-CM | POA: Diagnosis not present

## 2020-05-30 MED ORDER — FREESTYLE LIBRE 14 DAY SENSOR MISC
1 refills | Status: DC
Start: 2020-05-30 — End: 2020-09-17

## 2020-05-30 MED ORDER — OLMESARTAN MEDOXOMIL 40 MG PO TABS
ORAL_TABLET | ORAL | 1 refills | Status: DC
Start: 2020-05-30 — End: 2020-08-09

## 2020-05-30 MED ORDER — METFORMIN HCL ER 500 MG PO TB24
ORAL_TABLET | ORAL | 1 refills | Status: DC
Start: 2020-05-30 — End: 2020-08-17

## 2020-05-30 MED ORDER — ATORVASTATIN CALCIUM 40 MG PO TABS
40.0000 mg | ORAL_TABLET | Freq: Every day | ORAL | 1 refills | Status: DC
Start: 2020-05-30 — End: 2020-06-19

## 2020-05-30 NOTE — Telephone Encounter (Signed)
Patient called to request new RX's for the following:  Continuous Blood Gluc Sensor (FREESTYLE LIBRE 14 DAY SENSOR) MISC AND  spironolactone (ALDACTONE) 25 MG tablet Be sent to  [id number] - [place],  - [address] AT [address] Phone:  [phone]  Fax:  [phone]

## 2020-05-30 NOTE — Telephone Encounter (Signed)
Rx for Freestyle was sent in, patient gets spironlactone from a different provider and needs to talk to them about filling it/

## 2020-05-31 ENCOUNTER — Other Ambulatory Visit: Payer: Self-pay | Admitting: *Deleted

## 2020-06-01 NOTE — Telephone Encounter (Signed)
Noted,  Thank you!

## 2020-06-01 NOTE — Telephone Encounter (Signed)
Pt called requesting a refill for his Spironolactone. I informed pt another provider prescribed that medication so we cannot refill it and provided him with the number to reach Dr.Elizabeth Rodena Piety who prescribed the medication.

## 2020-06-04 ENCOUNTER — Other Ambulatory Visit: Payer: Self-pay | Admitting: *Deleted

## 2020-06-04 MED ORDER — ALCOHOL SWABS 70 % PADS: MEDICATED_PAD | 1 refills | Status: AC

## 2020-06-07 DIAGNOSIS — E119 Type 2 diabetes mellitus without complications: Secondary | ICD-10-CM | POA: Diagnosis not present

## 2020-06-07 DIAGNOSIS — H524 Presbyopia: Secondary | ICD-10-CM | POA: Diagnosis not present

## 2020-06-07 LAB — HM DIABETES EYE EXAM

## 2020-06-11 ENCOUNTER — Encounter: Payer: Self-pay | Admitting: *Deleted

## 2020-06-13 ENCOUNTER — Other Ambulatory Visit: Payer: Self-pay | Admitting: Endocrinology

## 2020-06-19 ENCOUNTER — Other Ambulatory Visit: Payer: Self-pay | Admitting: Endocrinology

## 2020-07-04 ENCOUNTER — Other Ambulatory Visit: Payer: Self-pay | Admitting: Endocrinology

## 2020-07-16 ENCOUNTER — Other Ambulatory Visit: Payer: Self-pay | Admitting: Endocrinology

## 2020-07-24 ENCOUNTER — Other Ambulatory Visit: Payer: Self-pay | Admitting: Endocrinology

## 2020-07-27 ENCOUNTER — Telehealth: Payer: Self-pay

## 2020-07-27 NOTE — Telephone Encounter (Signed)
He can fast but he needs to change the schedule of his insulin and metformin intake    Take tresiba same time  Novolog only when he eats a meal ( regardless of the time )  Metformin when he eats a meal ( regardless of the time )    If sugars fall below 70 . Needs to contact the office back

## 2020-07-27 NOTE — Telephone Encounter (Signed)
Patient called in wanting to know if it was ok for him to fast for 12 hours for his religous beliefs.   He is a Community education officer patient   Please call and advise

## 2020-07-30 ENCOUNTER — Telehealth: Payer: Self-pay | Admitting: Endocrinology

## 2020-07-30 NOTE — Telephone Encounter (Signed)
Patient states Dr Dwyane Dee recommended Kidney Function Testing at his last visit.  Would like this be scheduled/ordered as soon as possible, because he also in a period of fasting. Call back # [phone]

## 2020-07-30 NOTE — Telephone Encounter (Signed)
PT advised of Dr Quin Hoop recommendations from previous note

## 2020-07-30 NOTE — Telephone Encounter (Signed)
Please Advise

## 2020-07-31 NOTE — Telephone Encounter (Signed)
Ordered are in epic.  Please come in to have drawn

## 2020-07-31 NOTE — Telephone Encounter (Signed)
Routed to [facility] to set an appt for lab

## 2020-07-31 NOTE — Telephone Encounter (Signed)
Called pt to schedule lab appt, pt stated he is going to [place] tomorrow to get this kidney function test and that Dr.Kumar will be routed the results

## 2020-08-01 DIAGNOSIS — Z79899 Other long term (current) drug therapy: Secondary | ICD-10-CM | POA: Diagnosis not present

## 2020-08-01 LAB — BASIC METABOLIC PANEL
Creatinine: 1 (ref 0.6–1.3)
Glucose: 318

## 2020-08-09 ENCOUNTER — Other Ambulatory Visit: Payer: Self-pay | Admitting: Endocrinology

## 2020-08-14 ENCOUNTER — Telehealth: Payer: Self-pay | Admitting: Endocrinology

## 2020-08-14 NOTE — Telephone Encounter (Signed)
He will need to ask them to send [place] the paperwork again, I do not see any to be signed

## 2020-08-14 NOTE — Telephone Encounter (Signed)
Patient was asking about the paperwork because one of the DME companies was going to send him the freestyle sensor.  Also patient had labs drawn at [facility] and he was concerned about his Kidney function, I asked him to have the labs faxed to [place] so you could look at them.

## 2020-08-14 NOTE — Telephone Encounter (Signed)
Pt would like to talk to Dr.Kumar and  nurse about ordering a free style.

## 2020-08-15 NOTE — Telephone Encounter (Signed)
They just sent a request for his medical records which I faxed back yesterday.

## 2020-08-17 ENCOUNTER — Other Ambulatory Visit: Payer: Self-pay | Admitting: Endocrinology

## 2020-08-20 ENCOUNTER — Other Ambulatory Visit: Payer: Self-pay

## 2020-08-20 ENCOUNTER — Other Ambulatory Visit (INDEPENDENT_AMBULATORY_CARE_PROVIDER_SITE_OTHER): Payer: Medicare HMO

## 2020-08-20 DIAGNOSIS — E7800 Pure hypercholesterolemia, unspecified: Secondary | ICD-10-CM | POA: Diagnosis not present

## 2020-08-20 DIAGNOSIS — E1165 Type 2 diabetes mellitus with hyperglycemia: Secondary | ICD-10-CM | POA: Diagnosis not present

## 2020-08-20 DIAGNOSIS — Z794 Long term (current) use of insulin: Secondary | ICD-10-CM | POA: Diagnosis not present

## 2020-08-20 LAB — HEMOGLOBIN A1C: Hgb A1c MFr Bld: 7.9 % — ABNORMAL HIGH (ref 4.6–6.5)

## 2020-08-21 ENCOUNTER — Encounter: Payer: Self-pay | Admitting: Endocrinology

## 2020-08-21 LAB — LIPID PANEL
Cholesterol: 141 mg/dL (ref 0–200)
HDL: 44.7 mg/dL (ref >39.00)
LDL Cholesterol: 79 mg/dL (ref 0–99)
NonHDL: 96.78
Total CHOL/HDL Ratio: 3
Triglycerides: 87 mg/dL (ref 0.0–149.0)
VLDL: 17.4 mg/dL (ref 0.0–40.0)

## 2020-08-24 ENCOUNTER — Ambulatory Visit: Payer: Medicare HMO | Admitting: Endocrinology

## 2020-08-24 ENCOUNTER — Other Ambulatory Visit: Payer: Self-pay

## 2020-08-24 ENCOUNTER — Encounter: Payer: Self-pay | Admitting: Endocrinology

## 2020-08-24 VITALS — BP 118/60 | HR 64 | Ht 63.0 in | Wt 181.0 lb

## 2020-08-24 DIAGNOSIS — I10 Essential (primary) hypertension: Secondary | ICD-10-CM

## 2020-08-24 DIAGNOSIS — Z794 Long term (current) use of insulin: Secondary | ICD-10-CM | POA: Diagnosis not present

## 2020-08-24 DIAGNOSIS — E7800 Pure hypercholesterolemia, unspecified: Secondary | ICD-10-CM

## 2020-08-24 DIAGNOSIS — E1165 Type 2 diabetes mellitus with hyperglycemia: Secondary | ICD-10-CM | POA: Diagnosis not present

## 2020-08-24 MED ORDER — SPIRONOLACTONE 25 MG PO TABS: 25.0000 mg | ORAL_TABLET | Freq: Every day | ORAL | 2 refills | Status: DC

## 2020-08-24 NOTE — Progress Notes (Signed)
Patient ID: Austin Norris, male   DOB: 06/12/1955, 65 y.o.   MRN: [id number]   Reason for Appointment: Follow-up   History of Present Illness   Diagnosis: Type 2 DIABETES MELITUS, long-standing     He has been on insulin to treat his diabetes for several years partly because of failure of oral agents and also for reducing cost of medications He is generally checking his blood sugars once or twice a day but does not keep a record Overall has had somewhat poor control with A1c consistently over 7%  Recent history:    Insulin regimen: Tresiba 24 units at bedtime, Novolog usually 20 units before meals  Oral hypoglycemic drugs: Invokana 300mg , metformin ER 1 g daily  A1c is 7.9, previously 6.1  Current blood sugar patterns, management and problems identified:   He has apparently had overall high fasting readings for few weeks but did not let us know  He did not increase his Tyler Aas  Previously his Elenor Legato had been slightly lower than the actual blood sugar but he does  Because of his religious fast he is mostly eating well breakfast in the morning at 5 AM and dinner at 8 PM  May have significantly higher carbohydrate intake or portions at dinnertime  He is not increasing his NovoLog at dinnertime despite average increase of his blood sugars after dinner by about 110 mg on most days  Lab glucose was over 300 after breakfast when he may have had higher carbohydrate intake  Recently because of his fasting he is not doing any significant walking or exercise  Otherwise may be active only while at work but no formal exercise  His weight is about the same recently  Has been regular with Invokana and Metformin and no side effects     Does not do much formal exercise, only some walking at work    Side effects from medications: None  Proper timing of medications in relation to meals: Yes.          Monitors blood glucose:  freestyle libre version 2, this reportedly  is 10-20 mg lower than actual readings  Interpretation of his download shows the following  Blood sugar data is only available for the last week or so  Highest readings are after dinner at night  Lowest readings are before dinner which is around 8 PM  Blood sugars are persistently high throughout the day and night and trending somewhat lower late evening  However POSTPRANDIAL readings are only high on 1 morning after breakfast which is at 5 AM and otherwise generally going up significantly after dinner  Fasting readings are relatively higher near 180+ the last 2 days at least and otherwise mostly high overnight  No hypoglycemia  CGM use % of time  45  2-week average/GV  180+/-4  Time in range    51    %  % Time Above 180 46  % Time above 250 3  % Time Below 70 0     PRE-MEAL Fasting Lunch Dinner Bedtime Overall  Glucose range:       Averages:  186   121   180   POST-MEAL PC Breakfast PC Lunch PC Dinner  Glucose range:     Averages:  192   232    Previously   CGM use % of time  64  2-week average/GV  149/24  Time in range     85  %  %  Time Above 180  13  % Time above 250 2  % Time Below 70 0     PRE-MEAL Fasting Lunch Dinner Bedtime Overall  Glucose range:       Averages:  130  140  132  157    POST-MEAL PC Breakfast PC Lunch PC Dinner  Glucose range:     Averages:  146  140  180    Previous data:   CGM use % of time  63  2-week average/SD  144, GV 33  Time in range  74     %  % Time Above 180  21  % Time above 250   % Time Below 70  5     PRE-MEAL Fasting Lunch Dinner Bedtime Overall  Glucose range:       Averages:  129   123  136  144   POST-MEAL PC Breakfast PC Lunch PC Dinner  Glucose range:     Averages:  166   166      Meals: 2-3  meals per day, usually some form of bread and milk in the morning, relatively smaller lunch, sometimes having eggs at lunchtime., sometimes sweets in pm, fried food occasionally        Dinner usually 5-6 PM          Dietician visit: Most recent: None             Wt Readings from Last 3 Encounters:  08/24/20 181 lb (82.1 kg)  05/25/20 187 lb 9.6 oz (85.1 kg)  04/02/20 181 lb 3.2 oz (82.2 kg)   LABS:  Lab Results  Component Value Date   HGBA1C 7.9 (H) 08/20/2020   HGBA1C 6.1 05/21/2020   HGBA1C 7.3 (H) 03/23/2020   Lab Results  Component Value Date   MICROALBUR 1.5 05/21/2020   LDLCALC 79 08/20/2020   CREATININE 1.0 08/01/2020    Other active problems: See review of systems   Allergies as of 08/24/2020   No Known Allergies     Medication List       Accurate as of August 24, 2020 10:54 AM. If you have any questions, ask your nurse or doctor.        Alcohol Swabs 70 % Pads Use as directed   aspirin 81 MG chewable tablet Chew 1 tablet (81 mg total) by mouth daily.   atorvastatin 40 MG tablet Commonly known as: LIPITOR Take 1 tablet (40 mg total) by mouth daily.   Contour Next Test test strip Generic drug: glucose blood USE TO CHECK BLOOD SUGAR TWICE A DAY   Dilt-XR 240 MG 24 hr capsule Generic drug: diltiazem TAKE 1 CAPSULE BY MOUTH EVERY DAY   FreeStyle Libre 14 Day Sensor Misc USE EVERY 14 DAYS   gabapentin 100 MG capsule Commonly known as: NEURONTIN TAKE 1 CAPSULE 3 TIMES DAILY AS NEEDED WITH FOOD FOR SYMPTOM RELIEF   INSULIN SYRINGE 1CC/31GX5/16" 31G X 5/16" 1 ML Misc Use to inject lantus daily   Invokana 300 MG Tabs tablet Generic drug: canagliflozin TAKE 1 TABLET BY MOUTH EVERY DAY BEFORE BREAKFAST What changed: See the new instructions.   metFORMIN 500 MG 24 hr tablet Commonly known as: GLUCOPHAGE-XR TAKE 1 TABLET BY MOUTH IN THE MORNING AND 2 TABLETS IN THE EVENING   Microlet Lancets Misc Use to check blood sugar 2 times a day   NovoLOG FlexPen 100 UNIT/ML FlexPen Generic drug: insulin aspart INJECT 35 UNITS INTO THE SKIN IN THE MORNING, AT NOON, AND  AT BEDTIME.   olmesartan 40 MG tablet Commonly known as: BENICAR TAKE 1 TABLET BY MOUTH  EVERY DAY   pantoprazole 40 MG tablet Commonly known as: PROTONIX Take 40 mg by mouth daily.   spironolactone 25 MG tablet Commonly known as: ALDACTONE Take 1 tablet (25 mg total) by mouth daily.   spironolactone-hydrochlorothiazide 25-25 MG tablet Commonly known as: ALDACTAZIDE TAKE 1 TABLET BY MOUTH EVERY DAY   traZODone 50 MG tablet Commonly known as: DESYREL Take 1 tablet (50 mg total) by mouth at bedtime.   Tyler Aas FlexTouch 200 UNIT/ML FlexTouch Pen Generic drug: insulin degludec Inject 24 Units into the skin at bedtime.       Allergies: No Known Allergies  Past Medical History:  Diagnosis Date  . Diabetes mellitus   . H/O epistaxis   . History of stomach ulcers    30 YRS AGO  . Hypercholesteremia   . Hypertension   . Shoulder pain, left   . Umbilical hernia     Past Surgical History:  Procedure Laterality Date  . CARDIAC CATHETERIZATION  1997  . COLONOSCOPY    . UMBILICAL HERNIA REPAIR N/A 07/11/2015   Procedure: LAPAROSCOPIC ASSISTED OPEN UMBILICAL HERNIA;  Surgeon: Johnathan Hausen, MD;  Location: [facility];  Service: General;  Laterality: N/A;  With MESH    Family History  Problem Relation Age of Onset  . Heart disease Mother   . Diabetes Father   . Diabetes Sister    No colon cancer  Social History:  reports that he quit smoking about 23 years ago. He has never used smokeless tobacco. He reports that he does not drink alcohol and does not use drugs.  Review of Systems:   HYPERTENSION:  he has had long-standing hypertension Has been prescribed Aldactone, diltiazem 240 and Benicar 40 mg  Amlodipine was changed to diltiazem because of swelling of his legs Recently for some reason he seems to be taking Aldactazide instead of spironolactone 25 mg Previously had hyponatremia with HCTZ but now it is 134 Has not checked blood pressure recently at home  Is also on Invokana  BP Readings from Last 3 Encounters:  08/24/20 118/60  05/25/20 130/66   04/02/20 (!) 144/72   Lab Results  Component Value Date   CREATININE 1.0 08/01/2020   BUN 18 05/21/2020   NA 137 05/21/2020   K 4.3 05/21/2020   CL 104 05/21/2020   CO2 25 05/21/2020     HYPERLIPIDEMIA: The lipid abnormality consists of elevated LDL.    He  is taking his Lipitor 20 mg daily LDL very stable   Has had some evidence of CAD in the past but asymptomatic    Lab Results  Component Value Date   CHOL 141 08/20/2020   CHOL 151 04/02/2020   CHOL 157 07/12/2019   Lab Results  Component Value Date   HDL 44.70 08/20/2020   HDL 44.40 04/02/2020   HDL 47.20 07/12/2019   Lab Results  Component Value Date   LDLCALC 79 08/20/2020   LDLCALC 85 04/02/2020   LDLCALC 79 07/12/2019   Lab Results  Component Value Date   TRIG 87.0 08/20/2020   TRIG 107.0 04/02/2020   TRIG 157.0 (H) 07/12/2019   Lab Results  Component Value Date   CHOLHDL 3 08/20/2020   CHOLHDL 3 04/02/2020   CHOLHDL 3 07/12/2019   Lab Results  Component Value Date   LDLDIRECT 89.8 12/29/2013     He has had burning and pains in his lower  legs and feet improved with gabapentin  He is asking about other medical issues such as a broken fingernail  He has had 3 injections of the COVID vaccines      Examination:   BP 118/60   Pulse 64   Ht 5\' 3"  (1.6 m)   Wt 181 lb (82.1 kg)   SpO2 98%   BMI 32.06 kg/m   Body mass index is 32.06 kg/m.   Repeat standing blood pressure about the same  ASSESSMENT/ PLAN:   Diabetes type 2 insulin-dependent  See history of present illness for detailed discussion of his current management, blood sugar patterns and problems identified  His A1c is 7.9  He is on basal bolus insulin, Invokana and metformin  His blood sugars are significantly higher, previously A1c was only 6.1  Blood sugars are assessed from his freestyle [employer] which has previously showed slightly lower readings by up to 20 mg compared to fingersticks Recently appears to be  needing higher basal insulin and also increase insulin coverage for his dinner meal Not exercising lately Diet may be relatively high in carbohydrate especially at dinner  HYPERTENSION: Consistently well controlled but blood pressure is low normal today Also potassium recently 5.0, otherwise normal with Aldactone Renal function normal  RECOMMENDATIONS:  At least 28 units of Tresiba to get morning sugars below 130  Once he is more active we can reduce the dose of 26 if getting overnight low normal sugars  At least take 25 units to cover evening meal for now of NovoLog  Discussed postprandial targets  Needs to moderate portions of carbohydrates and avoid high-fat foods  Regular walking for exercise   For hypertension recommend that he switch back to Aldactone and stop Aldactazide and monitor blood pressure at home Follow-up in 2 [place] of diabetes and hypertension  Lipids: LDL 79 on statins  There are no Patient Instructions on file for this visit.     Elayne Snare 08/24/2020, 10:54 AM   Note: This office note was prepared with Dragon voice recognition system technology. Any transcriptional errors that result from this process are unintentional.

## 2020-08-24 NOTE — Patient Instructions (Addendum)
Stop combination Spironolactone  Tresiba 28

## 2020-08-27 ENCOUNTER — Other Ambulatory Visit: Payer: Self-pay | Admitting: Endocrinology

## 2020-08-31 ENCOUNTER — Other Ambulatory Visit: Payer: Self-pay | Admitting: Endocrinology

## 2020-09-17 ENCOUNTER — Telehealth: Payer: Self-pay | Admitting: Endocrinology

## 2020-09-17 MED ORDER — FREESTYLE LIBRE 14 DAY SENSOR MISC: 1 refills | Status: DC

## 2020-09-17 NOTE — Telephone Encounter (Signed)
Patient requesting that the RX for [facility] [place] as per Dr Ronnie Derby recommendation at last visit. [facility]

## 2020-09-17 NOTE — Telephone Encounter (Signed)
Refill sent.

## 2020-09-17 NOTE — Telephone Encounter (Signed)
Pt needs a refill on Continuous Blood Gluc Sensor (FREESTYLE LIBRE 14 DAY SENSOR) [facility] pharmacy states they do not have the prescription.    [id number] [place] [place] [address] [facility] [address]

## 2020-09-22 MED ORDER — SPIRONOLACTONE 25 MG PO TABS: 25.0000 mg | ORAL_TABLET | Freq: Every day | ORAL | 2 refills | Status: DC

## 2020-09-22 NOTE — Telephone Encounter (Signed)
Resent as requested.  

## 2020-09-22 NOTE — Addendum Note (Signed)
Addended by: Jacqualin Combes on: 09/22/2020 09:19 AM   Modules accepted: Orders

## 2020-09-26 ENCOUNTER — Other Ambulatory Visit: Payer: Self-pay

## 2020-09-26 ENCOUNTER — Other Ambulatory Visit (INDEPENDENT_AMBULATORY_CARE_PROVIDER_SITE_OTHER): Payer: Medicare HMO

## 2020-09-26 DIAGNOSIS — E1165 Type 2 diabetes mellitus with hyperglycemia: Secondary | ICD-10-CM

## 2020-09-26 DIAGNOSIS — Z794 Long term (current) use of insulin: Secondary | ICD-10-CM | POA: Diagnosis not present

## 2020-09-26 LAB — BASIC METABOLIC PANEL
BUN: 20 mg/dL (ref 6–23)
CO2: 25 mEq/L (ref 19–32)
Calcium: 9.6 mg/dL (ref 8.4–10.5)
Chloride: 104 mEq/L (ref 96–112)
Creatinine, Ser: 0.96 mg/dL (ref 0.40–1.50)
GFR: 82.89 mL/min (ref >60.00)
Glucose, Bld: 85 mg/dL (ref 70–99)
Potassium: 4.3 mEq/L (ref 3.5–5.1)
Sodium: 137 mEq/L (ref 135–145)

## 2020-09-27 ENCOUNTER — Ambulatory Visit: Payer: Medicare HMO | Admitting: Endocrinology

## 2020-09-27 LAB — FRUCTOSAMINE: Fructosamine: 298 umol/L — ABNORMAL HIGH (ref 0–285)

## 2020-09-28 ENCOUNTER — Other Ambulatory Visit: Payer: Self-pay

## 2020-09-28 ENCOUNTER — Ambulatory Visit: Payer: Medicare HMO | Admitting: Endocrinology

## 2020-09-28 ENCOUNTER — Encounter: Payer: Self-pay | Admitting: Endocrinology

## 2020-09-28 VITALS — BP 118/62 | HR 62 | Ht 63.0 in | Wt 188.0 lb

## 2020-09-28 DIAGNOSIS — E1165 Type 2 diabetes mellitus with hyperglycemia: Secondary | ICD-10-CM | POA: Diagnosis not present

## 2020-09-28 DIAGNOSIS — Z794 Long term (current) use of insulin: Secondary | ICD-10-CM

## 2020-09-28 DIAGNOSIS — I10 Essential (primary) hypertension: Secondary | ICD-10-CM | POA: Diagnosis not present

## 2020-09-28 MED ORDER — SPIRONOLACTONE 25 MG PO TABS: 25.0000 mg | ORAL_TABLET | Freq: Every day | ORAL | 2 refills | Status: DC

## 2020-09-28 NOTE — Patient Instructions (Signed)
Take 200mg  Gabapentin 3x daily  16 Novolog in am

## 2020-09-28 NOTE — Progress Notes (Signed)
Patient ID: Austin Norris, male   DOB: 12/16/1955, 65 y.o.   MRN: [id number]   Reason for Appointment: Follow-up   History of Present Illness   Diagnosis: Type 2 DIABETES MELITUS, long-standing     He has been on insulin to treat his diabetes for several years partly because of failure of oral agents and also for reducing cost of medications He is generally checking his blood sugars once or twice a day but does not keep a record Overall has had somewhat poor control with A1c consistently over 7%  Recent history:    Insulin regimen: Tresiba 20 units at bedtime, Novolog usually 20 units before meals  Oral hypoglycemic drugs: Invokana 300mg , metformin ER 1 g daily  A1c is last 7.9, previously 6.1  Fructosamine is now 298  Current blood sugar patterns, management and problems identified:   He has much improved blood sugar control as judged by his freestyle libre in the last 2 weeks  Not clear previously because of dietary factors he had fairly high readings including fasting  Had overall high fasting readings for few weeks but did not let us know  He did increase his Tresiba to 28 units as directed on the last visit  With this his fasting readings are fairly good and his morning sugars are around 130 average  No hypoglycemia documented although may occasionally have transiently low readings late morning around noontime  He now says that he takes 20 units of NovoLog in the morning for breakfast even though he is not eating any bread lately and mostly eating protein; also he is walking more in the mornings at work  Postprandial readings after his evening meals are generally very stable or occasionally slightly lower  Previously his Elenor Legato had been slightly lower than the actual blood sugar but recently lab glucose was 85 and his freestyle libre was about the same on his sensor tracing  Not clear why he has gained 7 pounds but may have lost some weight while doing  his fasting previously  Has been regular with Invokana and Metformin as prescribed     Does not do much formal exercise, only some walking at work    Side effects from medications: None          Monitors blood glucose:  freestyle libre version 2  CGM use % of time in the last 2 weeks  54   average/GV since 5/27  131  Time in range     92%  % Time Above 180  7%  % Time above 250   % Time Below 70 1     PRE-MEAL Fasting Lunch Dinner Bedtime Overall  Glucose range:       Averages:  128  118  120     POST-MEAL PC Breakfast PC Lunch PC Dinner  Glucose range:     Averages:  137  139  140   Previously:   CGM use % of time  45  2-week average/GV  180+/-4  Time in range    51    %  % Time Above 180 46  % Time above 250 3  % Time Below 70 0     PRE-MEAL Fasting Lunch Dinner Bedtime Overall  Glucose range:       Averages:  186   121   180   POST-MEAL PC Breakfast PC Lunch PC Dinner  Glucose range:     Averages:  192   232      Meals: 2-3  meals per day, usually some form of bread and milk in the morning, relatively smaller lunch, sometimes having eggs at lunchtime., sometimes sweets in pm, fried food occasionally        Dinner usually 5-6 PM         Dietician visit: Most recent: None             Wt Readings from Last 3 Encounters:  09/28/20 188 lb (85.3 kg)  08/24/20 181 lb (82.1 kg)  05/25/20 187 lb 9.6 oz (85.1 kg)   LABS:  Lab Results  Component Value Date   HGBA1C 7.9 (H) 08/20/2020   HGBA1C 6.1 05/21/2020   HGBA1C 7.3 (H) 03/23/2020   Lab Results  Component Value Date   MICROALBUR 1.5 05/21/2020   LDLCALC 79 08/20/2020   CREATININE 0.96 09/26/2020    Other active problems: See review of systems   Allergies as of 09/28/2020   No Known Allergies     Medication List       Accurate as of September 28, 2020  9:18 PM. If you have any questions, ask your nurse or doctor.        Alcohol Swabs 70 % Pads Use as directed   aspirin 81 MG chewable  tablet Chew 1 tablet (81 mg total) by mouth daily.   atorvastatin 40 MG tablet Commonly known as: LIPITOR Take 1 tablet (40 mg total) by mouth daily.   Contour Next Test test strip Generic drug: glucose blood USE TO CHECK BLOOD SUGAR TWICE A DAY   Dilt-XR 240 MG 24 hr capsule Generic drug: diltiazem TAKE 1 CAPSULE BY MOUTH EVERY DAY   FreeStyle Libre 14 Day Sensor Misc USE EVERY 14 DAYS   gabapentin 100 MG capsule Commonly known as: NEURONTIN TAKE 1 CAPSULE 3 TIMES DAILY AS NEEDED WITH FOOD FOR SYMPTOM RELIEF   INSULIN SYRINGE 1CC/31GX5/16" 31G X 5/16" 1 ML Misc Use to inject lantus daily   Invokana 300 MG Tabs tablet Generic drug: canagliflozin TAKE 1 TABLET BY MOUTH EVERY DAY BEFORE BREAKFAST   metFORMIN 500 MG 24 hr tablet Commonly known as: GLUCOPHAGE-XR TAKE 1 TABLET BY MOUTH IN THE MORNING AND 2 TABLETS IN THE EVENING   Microlet Lancets Misc Use to check blood sugar 2 times a day   NovoLOG FlexPen 100 UNIT/ML FlexPen Generic drug: insulin aspart INJECT 35 UNITS INTO THE SKIN IN THE MORNING, AT NOON, AND AT BEDTIME.   olmesartan 40 MG tablet Commonly known as: BENICAR TAKE 1 TABLET BY MOUTH EVERY DAY   pantoprazole 40 MG tablet Commonly known as: PROTONIX Take 40 mg by mouth daily.   spironolactone 25 MG tablet Commonly known as: ALDACTONE Take 1 tablet (25 mg total) by mouth daily.   traZODone 50 MG tablet Commonly known as: DESYREL Take 1 tablet (50 mg total) by mouth at bedtime.   Tyler Aas FlexTouch 200 UNIT/ML FlexTouch Pen Generic drug: insulin degludec Inject 24 Units into the skin at bedtime.       Allergies: No Known Allergies  Past Medical History:  Diagnosis Date  . Diabetes mellitus   . H/O epistaxis   . History of stomach ulcers    30 YRS AGO  . Hypercholesteremia   . Hypertension   . Shoulder pain, left   . Umbilical hernia     Past Surgical History:  Procedure Laterality Date  . CARDIAC CATHETERIZATION  1997  .  COLONOSCOPY    .  UMBILICAL HERNIA REPAIR N/A 07/11/2015   Procedure: LAPAROSCOPIC ASSISTED OPEN UMBILICAL HERNIA;  Surgeon: Johnathan Hausen, MD;  Location: [facility];  Service: General;  Laterality: N/A;  With MESH    Family History  Problem Relation Age of Onset  . Heart disease Mother   . Diabetes Father   . Diabetes Sister    No colon cancer  Social History:  reports that he quit smoking about 23 years ago. He has never used smokeless tobacco. He reports that he does not drink alcohol and does not use drugs.  Review of Systems:   HYPERTENSION:  he has had long-standing hypertension Has been prescribed Aldactone, diltiazem 240 and Benicar 40 mg  He was told to stop taking Aldactazide and instead start back on spironolactone 25 mg because of low normal blood pressures but he says that he has not received that prescription  Previously had hyponatremia with HCTZ but not recently No lightheadedness  Is also on Invokana  BP Readings from Last 3 Encounters:  09/28/20 118/62  08/24/20 118/60  05/25/20 130/66   Lab Results  Component Value Date   CREATININE 0.96 09/26/2020   BUN 20 09/26/2020   NA 137 09/26/2020   K 4.3 09/26/2020   CL 104 09/26/2020   CO2 25 09/26/2020     HYPERLIPIDEMIA: The lipid abnormality consists of elevated LDL.    He  is taking his Lipitor 20 mg daily LDL very stable   Has had some evidence of CAD in the past but asymptomatic    Lab Results  Component Value Date   CHOL 141 08/20/2020   CHOL 151 04/02/2020   CHOL 157 07/12/2019   Lab Results  Component Value Date   HDL 44.70 08/20/2020   HDL 44.40 04/02/2020   HDL 47.20 07/12/2019   Lab Results  Component Value Date   LDLCALC 79 08/20/2020   LDLCALC 85 04/02/2020   LDLCALC 79 07/12/2019   Lab Results  Component Value Date   TRIG 87.0 08/20/2020   TRIG 107.0 04/02/2020   TRIG 157.0 (H) 07/12/2019   Lab Results  Component Value Date   CHOLHDL 3 08/20/2020   CHOLHDL 3  04/02/2020   CHOLHDL 3 07/12/2019   Lab Results  Component Value Date   LDLDIRECT 89.8 12/29/2013     He has had burning and pains in his lower legs and feet treated with gabapentin However he thinks he has more pain and paresthesiae during the night   He has had 3 injections of the COVID vaccines      Examination:   BP 118/62 (BP Location: Left Arm, Patient Position: Sitting, Cuff Size: Large)   Pulse 62   Ht 5\' 3"  (1.6 m)   Wt 188 lb (85.3 kg)   SpO2 94%   BMI 33.30 kg/m   Body mass index is 33.3 kg/m.     ASSESSMENT/ PLAN:   Diabetes type 2 insulin-dependent  See history of present illness for detailed discussion of his current management, blood sugar patterns and problems identified  His A1c is 7.9 but now fructosamine is 298  He is on basal bolus insulin, Invokana and metformin  His blood sugars are appearing very well controlled recently with 92% in target range at least for the last week or so With getting back to more physical activity and using 28 of Tresiba his blood sugars are well controlled He is also cutting back on carbohydrates at breakfast at least Discussed home that NovoLog doses proportional to his meal  size and carbohydrate intake and also reminded him to take it before starting to eat   HYPERTENSION: Blood pressure is low normal and likely does not need HCTZ and only Aldactone, currently potassium is normal  RECOMMENDATIONS:  Continue 28 units of Tresiba and keep morning sugars below 130 and above at least 80  He needs to cut back his NovoLog to 16 units in the morning unless he is eating any carbohydrate  Also may need a midmorning snack at work around 10 AM  Continue average of 20 units at nighttime and adjust based on meal size and carbohydrates  Needs to make sure he is not getting high calorie foods to prevent further weight gain  He will let us know if he starts getting any unusually high or low readings   For hypertension  needs to go back to Aldactone and stop Aldactazide since blood pressure is again low normal, new prescription sent  For neuropathy can go up to 200 mg of gabapentin at bedtime and let us know when he needs any updated prescription, potentially may switch him to 300 mg prescription   Patient Instructions  Take 200mg  Gabapentin 3x daily  16 Novolog in am        Elayne Snare 09/28/2020, 9:18 PM   Note: This office note was prepared with Dragon voice recognition system technology. Any transcriptional errors that result from this process are unintentional.

## 2020-10-01 DIAGNOSIS — A09 Infectious gastroenteritis and colitis, unspecified: Secondary | ICD-10-CM | POA: Diagnosis not present

## 2020-10-01 DIAGNOSIS — K3184 Gastroparesis: Secondary | ICD-10-CM | POA: Diagnosis not present

## 2020-10-04 ENCOUNTER — Other Ambulatory Visit: Payer: Self-pay | Admitting: Endocrinology

## 2020-10-04 ENCOUNTER — Telehealth: Payer: Self-pay | Admitting: Endocrinology

## 2020-10-04 NOTE — Telephone Encounter (Signed)
Pt calling in stating that insurance does not want to cover. Pt would like to know the alternatives. Pt needs it written for three months so insurance will cover it.. pt would like a call back   INVOKANA 300 MG TABS tablet   [id number] [place] [place] [facility] [address] [place]. AT [address]

## 2020-10-05 NOTE — Telephone Encounter (Signed)
Please advise 

## 2020-10-07 ENCOUNTER — Other Ambulatory Visit: Payer: Self-pay | Admitting: Endocrinology

## 2020-10-11 NOTE — Telephone Encounter (Signed)
Called pt no answer. Left message to give [place] a call back.

## 2020-10-23 ENCOUNTER — Other Ambulatory Visit: Payer: Medicare HMO

## 2020-10-24 NOTE — Telephone Encounter (Signed)
Patient returning call - requests call back to [phone]

## 2020-10-26 ENCOUNTER — Ambulatory Visit: Payer: Medicare HMO | Admitting: Endocrinology

## 2020-10-26 NOTE — Telephone Encounter (Signed)
Pt called back. He states that he is not taking invokana currently but his blood pressure and blood sugars have been good. He will reach out to [place] if he starts having higher readings.

## 2020-10-26 NOTE — Telephone Encounter (Signed)
I called pt back again no answer. Left another vm to call me back.

## 2020-11-14 ENCOUNTER — Other Ambulatory Visit: Payer: Self-pay | Admitting: Endocrinology

## 2020-11-27 ENCOUNTER — Other Ambulatory Visit (INDEPENDENT_AMBULATORY_CARE_PROVIDER_SITE_OTHER): Payer: Medicare HMO

## 2020-11-27 ENCOUNTER — Other Ambulatory Visit: Payer: Self-pay

## 2020-11-27 DIAGNOSIS — Z794 Long term (current) use of insulin: Secondary | ICD-10-CM

## 2020-11-27 DIAGNOSIS — I10 Essential (primary) hypertension: Secondary | ICD-10-CM | POA: Diagnosis not present

## 2020-11-27 DIAGNOSIS — E1165 Type 2 diabetes mellitus with hyperglycemia: Secondary | ICD-10-CM | POA: Diagnosis not present

## 2020-11-27 DIAGNOSIS — E1169 Type 2 diabetes mellitus with other specified complication: Secondary | ICD-10-CM | POA: Diagnosis not present

## 2020-11-27 DIAGNOSIS — K3184 Gastroparesis: Secondary | ICD-10-CM | POA: Diagnosis not present

## 2020-11-27 LAB — BASIC METABOLIC PANEL
BUN: 16 mg/dL (ref 6–23)
CO2: 24 mEq/L (ref 19–32)
Calcium: 9.7 mg/dL (ref 8.4–10.5)
Chloride: 104 mEq/L (ref 96–112)
Creatinine, Ser: 0.95 mg/dL (ref 0.40–1.50)
GFR: 83.84 mL/min (ref >60.00)
Glucose, Bld: 97 mg/dL (ref 70–99)
Potassium: 4.8 mEq/L (ref 3.5–5.1)
Sodium: 137 mEq/L (ref 135–145)

## 2020-11-27 LAB — HEMOGLOBIN A1C: Hgb A1c MFr Bld: 7.5 % — ABNORMAL HIGH (ref 4.6–6.5)

## 2020-11-30 ENCOUNTER — Encounter: Payer: Self-pay | Admitting: Endocrinology

## 2020-11-30 ENCOUNTER — Other Ambulatory Visit: Payer: Self-pay

## 2020-11-30 ENCOUNTER — Ambulatory Visit (INDEPENDENT_AMBULATORY_CARE_PROVIDER_SITE_OTHER): Payer: Medicare HMO | Admitting: Endocrinology

## 2020-11-30 VITALS — BP 110/68 | HR 56 | Ht 63.0 in | Wt 188.4 lb

## 2020-11-30 DIAGNOSIS — Z794 Long term (current) use of insulin: Secondary | ICD-10-CM | POA: Diagnosis not present

## 2020-11-30 DIAGNOSIS — E7800 Pure hypercholesterolemia, unspecified: Secondary | ICD-10-CM

## 2020-11-30 DIAGNOSIS — I10 Essential (primary) hypertension: Secondary | ICD-10-CM | POA: Diagnosis not present

## 2020-11-30 DIAGNOSIS — E1165 Type 2 diabetes mellitus with hyperglycemia: Secondary | ICD-10-CM

## 2020-11-30 MED ORDER — GABAPENTIN 300 MG PO CAPS: 300.0000 mg | ORAL_CAPSULE | Freq: Three times a day (TID) | ORAL | 1 refills | Status: DC

## 2020-11-30 MED ORDER — EMPAGLIFLOZIN 25 MG PO TABS: 25.0000 mg | ORAL_TABLET | Freq: Every day | ORAL | 2 refills | Status: DC

## 2020-11-30 NOTE — Patient Instructions (Addendum)
Take eggs or protein with bread daily  Stop Spironolactone

## 2020-11-30 NOTE — Progress Notes (Signed)
Patient ID: Austin Norris, male   DOB: [birth year], 65 y.o.   MRN: [id number]   Reason for Appointment: Follow-up   History of Present Illness   Diagnosis: Type 2 DIABETES MELITUS, long-standing     He has been on insulin to treat his diabetes for several years partly because of failure of oral agents and also for reducing cost of medications He is generally checking his blood sugars once or twice a day but does not keep a record Overall has had somewhat poor control with A1c consistently over 7%  Recent history:    Insulin regimen: Tresiba 28 units at bedtime, Novolog usually 20 units before meals  Oral hypoglycemic drugs: not on Invokana '300mg'$ , metformin ER 1 g daily  A1c is still high at 7.5 compared to 7.9   Current blood sugar patterns, management and problems identified:  He has not been able to afford his Invokana for insurance reasons and has not been able to find out replacement  With this his blood sugars appear to be mostly higher  Even though his fasting readings are consistently high he has not increased his basal insulin  Also appears to be having mostly high readings after breakfast when he is usually only eating bread with his tea  He has been told to add protein with his breakfast but he is making a snack with eggs only for late morning  Does not think he has forgotten his NovoLog at mealtimes  Not clear if he is adjusting the dose consistently for meals and occasionally may have high readings after both lunch and dinner as well as occasional low normal readings postprandially  He is doing a little walking at his work  However his weight gain has leveled off  He does take metformin regularly     Does not do much formal exercise, only some walking at work    Side effects from medications: None         CGM device freestyle libre version 2  Interpretation of the current 2-week download as follows  Average blood sugar is overall ranging between  149 and 191 for any 2-hour segment.   HYPERGLYCEMIA has primarily seen after breakfast but also to variable extent in the afternoon and late evening In the last week most of his blood sugars are high at variable times of the day either midday, afternoon, after breakfast or occasionally late at night OVERNIGHT blood sugars are mostly increased with average between 149 and 165, also may be having mild dawn phenomenon POSTPRANDIAL readings as above are variably high after different meals but most consistently after breakfast Premeal blood sugars are also usually around 150 average at lunch and dinner No hypoglycemia with only rarely low normal readings before lunch and dinner  CGM use % of time 92  2-week average/GV 163/26  Time in range      69% was 92  % Time Above 180 28+3  % Time above 250   % Time Below 70 0     PRE-MEAL Fasting Lunch Dinner Bedtime Overall  Glucose range:       Averages: 154 155 152     POST-MEAL PC Breakfast PC Lunch PC Dinner  Glucose range:     Averages: 191 165 159    PREVIOUSLY:  CGM use % of time in the last 2 weeks  54   average/GV since 5/27  131  Time in range  92%  % Time Above 180  7%  % Time above 250   % Time Below 70 1     PRE-MEAL Fasting Lunch Dinner Bedtime Overall  Glucose range:       Averages:  128  118  120     POST-MEAL PC Breakfast PC Lunch PC Dinner  Glucose range:     Averages:  137  139  140      Meals: 2-3  meals per day, usually some form of bread and milk in the morning, relatively smaller lunch, sometimes having eggs at lunchtime., sometimes sweets in pm, fried food occasionally        Dinner usually 5-6 PM         Dietician visit: Most recent: None             Wt Readings from Last 3 Encounters:  11/30/20 188 lb 6.4 oz (85.5 kg)  09/28/20 188 lb (85.3 kg)  08/24/20 181 lb (82.1 kg)   LABS:  Lab Results  Component Value Date   HGBA1C 7.5 (H) 11/27/2020   HGBA1C 7.9 (H) 08/20/2020   HGBA1C 6.1 05/21/2020    Lab Results  Component Value Date   MICROALBUR 1.5 05/21/2020   LDLCALC 79 08/20/2020   CREATININE 0.95 11/27/2020    Other active problems: See review of systems   Allergies as of 11/30/2020   No Known Allergies      Medication List        Accurate as of November 30, 2020  9:01 PM. If you have any questions, ask your nurse or doctor.          STOP taking these medications    Invokana 300 MG Tabs tablet Generic drug: canagliflozin Stopped by: Elayne Snare, MD       TAKE these medications    Alcohol Swabs 70 % Pads Use as directed   aspirin 81 MG chewable tablet Chew 1 tablet (81 mg total) by mouth daily.   atorvastatin 40 MG tablet Commonly known as: LIPITOR Take 1 tablet (40 mg total) by mouth daily.   Contour Next Test test strip Generic drug: glucose blood USE TO CHECK BLOOD SUGAR TWICE A DAY   Dilt-XR 240 MG 24 hr capsule Generic drug: diltiazem TAKE 1 CAPSULE BY MOUTH EVERY DAY   empagliflozin 25 MG Tabs tablet Commonly known as: Jardiance Take 1 tablet (25 mg total) by mouth daily before breakfast. Started by: Elayne Snare, MD   FreeStyle Libre 14 Day Sensor Misc USE EVERY 14 DAYS   gabapentin 300 MG capsule Commonly known as: NEURONTIN Take 1 capsule (300 mg total) by mouth 3 (three) times daily. What changed:  medication strength See the new instructions. Changed by: Elayne Snare, MD   INSULIN SYRINGE 1CC/31GX5/16" 31G X 5/16" 1 ML Misc Use to inject lantus daily   metFORMIN 500 MG 24 hr tablet Commonly known as: GLUCOPHAGE-XR TAKE 1 TABLET BY MOUTH IN THE MORNING AND 2 TABLETS IN THE EVENING   Microlet Lancets Misc Use to check blood sugar 2 times a day   NovoLOG FlexPen 100 UNIT/ML FlexPen Generic drug: insulin aspart INJECT 35 UNITS INTO THE SKIN IN THE MORNING, AT NOON, AND AT BEDTIME.   olmesartan 40 MG tablet Commonly known as: BENICAR TAKE 1 TABLET BY MOUTH EVERY DAY   pantoprazole 40 MG tablet Commonly known as:  PROTONIX Take 40 mg by mouth daily.   spironolactone 25 MG tablet Commonly known as: ALDACTONE Take 1 tablet (25 mg  total) by mouth daily.   traZODone 50 MG tablet Commonly known as: DESYREL Take 1 tablet (50 mg total) by mouth at bedtime.   Tyler Aas FlexTouch 200 UNIT/ML FlexTouch Pen Generic drug: insulin degludec INJECT 24 UNITS INTO THE SKIN AT BEDTIME        Allergies: No Known Allergies  Past Medical History:  Diagnosis Date   Diabetes mellitus    H/O epistaxis    History of stomach ulcers    30 YRS AGO   Hypercholesteremia    Hypertension    Shoulder pain, left    Umbilical hernia     Past Surgical History:  Procedure Laterality Date   CARDIAC CATHETERIZATION  1997   COLONOSCOPY     UMBILICAL HERNIA REPAIR N/A 07/11/2015   Procedure: LAPAROSCOPIC ASSISTED OPEN UMBILICAL HERNIA;  Surgeon: Johnathan Hausen, MD;  Location: [facility];  Service: General;  Laterality: N/A;  With MESH    Family History  Problem Relation Age of Onset   Heart disease Mother    Diabetes Father    Diabetes Sister    No colon cancer  Social History:  reports that he quit smoking about 23 years ago. His smoking use included cigarettes. He has never used smokeless tobacco. He reports that he does not drink alcohol and does not use drugs.  Review of Systems:   HYPERTENSION:  he has had long-standing hypertension Has been prescribed Aldactone 25 mg, diltiazem 240 and Benicar 40 mg Recent BP at home 120/70 Previously Aldactazide was stopped He says he will occasionally get lightheaded  Currently not on SGLT2 drugs  BP Readings from Last 3 Encounters:  11/30/20 110/68  09/28/20 118/62  08/24/20 118/60   Lab Results  Component Value Date   CREATININE 0.95 11/27/2020   BUN 16 11/27/2020   NA 137 11/27/2020   K 4.8 11/27/2020   CL 104 11/27/2020   CO2 24 11/27/2020     HYPERLIPIDEMIA: The lipid abnormality consists of elevated LDL.    He  is taking his Lipitor 20 mg  daily LDL very stable   Has had some evidence of CAD in the past but asymptomatic    Lab Results  Component Value Date   CHOL 141 08/20/2020   CHOL 151 04/02/2020   CHOL 157 07/12/2019   Lab Results  Component Value Date   HDL 44.70 08/20/2020   HDL 44.40 04/02/2020   HDL 47.20 07/12/2019   Lab Results  Component Value Date   LDLCALC 79 08/20/2020   LDLCALC 85 04/02/2020   LDLCALC 79 07/12/2019   Lab Results  Component Value Date   TRIG 87.0 08/20/2020   TRIG 107.0 04/02/2020   TRIG 157.0 (H) 07/12/2019   Lab Results  Component Value Date   CHOLHDL 3 08/20/2020   CHOLHDL 3 04/02/2020   CHOLHDL 3 07/12/2019   Lab Results  Component Value Date   LDLDIRECT 89.8 12/29/2013     He has had burning and pains in his lower legs and feet treated with gabapentin However he thinks he has inadequate control of his pain and paresthesiae with 200 mg gabapentin  No numbness  He has had 3 injections of the COVID vaccines      Examination:   BP 110/68 (Patient Position: Standing)   Pulse (!) 56   Ht '5\' 3"'$  (1.6 m)   Wt 188 lb 6.4 oz (85.5 kg)   SpO2 95%   BMI 33.37 kg/m   Body mass index is 33.37 kg/m.   Sitting  blood pressure 120/76 initially No ankle edema  ASSESSMENT/ PLAN:   Diabetes type 2 insulin-dependent  See history of present illness for detailed discussion of his current management, blood sugar patterns and problems identified  His A1c is 7.5 but previously Has been generally better  He is on basal bolus insulin and metformin  His blood sugars are not as well controlled but leading off Invokana and previously also had gained little weight Blood sugars are higher mostly before meals but also periodically after certain meals especially breakfast  Timing range is now only 69% compared to 93   HYPERTENSION: Blood pressure is low normal standing up even without taking Invokana and stopping HCTZ  Neuropathy: Still has persistent symptoms of  paresthesiae  RECOMMENDATIONS: Trial of Jardiance which appears to be covered by his insurance He will take 25 mg daily  If he does not see any improvement with adding Jardiance he will go up 2 to 4 units on Tresiba  Even make sure he has his eggs or other protein at breakfast instead of at midmorning snack  Otherwise may need to increase his mealtime coverage of postprandial readings continue to be going up over 200  Encouraged him to walk regularly for exercise   For hypertension needs to go stop Aldactone, he will let us know if his blood pressure tends to be unusually high  Also may benefit from starting Jardiance  For neuropathy can go up to 300 mg of gabapentin twice a day  Recheck lipids on the next visit   Patient Instructions  Take eggs or protein with bread daily  Stop Spironolactone     Elayne Snare 11/30/2020, 9:01 PM   Note: This office note was prepared with Dragon voice recognition system technology. Any transcriptional errors that result from this process are unintentional.

## 2020-12-01 ENCOUNTER — Other Ambulatory Visit: Payer: Self-pay | Admitting: Endocrinology

## 2020-12-25 DIAGNOSIS — R42 Dizziness and giddiness: Secondary | ICD-10-CM | POA: Diagnosis not present

## 2020-12-25 DIAGNOSIS — I10 Essential (primary) hypertension: Secondary | ICD-10-CM | POA: Diagnosis not present

## 2021-01-04 DIAGNOSIS — K3184 Gastroparesis: Secondary | ICD-10-CM | POA: Diagnosis not present

## 2021-01-04 DIAGNOSIS — I10 Essential (primary) hypertension: Secondary | ICD-10-CM | POA: Diagnosis not present

## 2021-01-04 DIAGNOSIS — Z794 Long term (current) use of insulin: Secondary | ICD-10-CM | POA: Diagnosis not present

## 2021-01-04 DIAGNOSIS — E1169 Type 2 diabetes mellitus with other specified complication: Secondary | ICD-10-CM | POA: Diagnosis not present

## 2021-01-04 DIAGNOSIS — R42 Dizziness and giddiness: Secondary | ICD-10-CM | POA: Diagnosis not present

## 2021-01-07 ENCOUNTER — Other Ambulatory Visit: Payer: Medicare HMO

## 2021-01-08 ENCOUNTER — Telehealth: Payer: Self-pay | Admitting: Endocrinology

## 2021-01-08 NOTE — Telephone Encounter (Signed)
Called patient and informed him that he needs to reach out to PCP. Patient stated that he already did but will reach back out.

## 2021-01-08 NOTE — Telephone Encounter (Signed)
Pt calling in voiced that he needs to be seen sooner than app that was set.I found an app on 02/13/2021 since patient is a 30 minute patient. I was wondering if we could change his app type to 15 minutes. So he can be seen sooner. Pt voiced, that he is having numbness in fingers and lips. Pt would like a call back as soon as possible.

## 2021-01-09 ENCOUNTER — Encounter: Payer: Self-pay | Admitting: Endocrinology

## 2021-01-09 ENCOUNTER — Telehealth: Payer: Self-pay

## 2021-01-09 NOTE — Telephone Encounter (Signed)
Pt upset that Vania Rea is making him dehydrated. Would like to talk to [place]. Pt phone: [phone]

## 2021-01-10 NOTE — Telephone Encounter (Signed)
Patient was told by PCP that he was dehydrated.  Apparently was taking HCTZ even though this had been previously stopped.  This has been stopped by PCP.  Recommended that he start half a tablet of Jardiance, since it was helping his sugars.  Please get recent labs faxed from his [place] PCP

## 2021-01-11 ENCOUNTER — Ambulatory Visit: Payer: Medicare HMO | Admitting: Endocrinology

## 2021-01-11 NOTE — Telephone Encounter (Signed)
Called patient to get [facility] PCP phone # to get fax #, but no answer.

## 2021-01-12 ENCOUNTER — Encounter: Payer: Self-pay | Admitting: Endocrinology

## 2021-01-15 ENCOUNTER — Ambulatory Visit: Payer: Medicare HMO | Admitting: Cardiology

## 2021-01-15 ENCOUNTER — Encounter: Payer: Self-pay | Admitting: Cardiology

## 2021-01-15 ENCOUNTER — Other Ambulatory Visit: Payer: Self-pay

## 2021-01-15 VITALS — BP 149/76 | HR 65 | Resp 16 | Ht 63.0 in | Wt 191.0 lb

## 2021-01-15 DIAGNOSIS — R42 Dizziness and giddiness: Secondary | ICD-10-CM

## 2021-01-15 DIAGNOSIS — Z87891 Personal history of nicotine dependence: Secondary | ICD-10-CM | POA: Diagnosis not present

## 2021-01-15 DIAGNOSIS — I10 Essential (primary) hypertension: Secondary | ICD-10-CM

## 2021-01-15 DIAGNOSIS — Z794 Long term (current) use of insulin: Secondary | ICD-10-CM | POA: Diagnosis not present

## 2021-01-15 DIAGNOSIS — R9431 Abnormal electrocardiogram [ECG] [EKG]: Secondary | ICD-10-CM

## 2021-01-15 DIAGNOSIS — E782 Mixed hyperlipidemia: Secondary | ICD-10-CM | POA: Diagnosis not present

## 2021-01-15 DIAGNOSIS — E6609 Other obesity due to excess calories: Secondary | ICD-10-CM | POA: Diagnosis not present

## 2021-01-15 DIAGNOSIS — Z6833 Body mass index (BMI) 33.0-33.9, adult: Secondary | ICD-10-CM | POA: Diagnosis not present

## 2021-01-15 DIAGNOSIS — E1165 Type 2 diabetes mellitus with hyperglycemia: Secondary | ICD-10-CM | POA: Diagnosis not present

## 2021-01-15 NOTE — Progress Notes (Signed)
Date:  01/15/2021   ID:  Austin Norris, DOB Mar 18, 1956, MRN [id number]  PCP:  Leeroy Cha, MD  Cardiologist:  Rex Kras, DO, [facility] (established care 01/15/21)  REASON FOR CONSULT: Dizziness  REQUESTING PHYSICIAN:  Leeroy Cha, MD [address] [place],  [place] [id number]  Chief Complaint  Patient presents with   Dizziness   New Patient (Initial Visit)    HPI  Austin Norris is a 65 y.o. male who presents to the office with a chief complaint of " dizziness." Patient's past medical history and cardiovascular risk factors include: Benign essential hypertension, hyperlipidemia, insulin-dependent diabetes mellitus type 2, BPH, gastroparesis, former smoker, obesity due to excess calories.  He is referred to the office at the request of Leeroy Cha,* for evaluation of dizziness.  Patient is referred to the office for evaluation of lightheaded and dizziness.  He recently traveled back from [place] and went to see his primary care provider.  His medications were currently being titrated given his underlying diabetes.  He was started on Jardiance and has been on other diuretic therapy.  Soon thereafter started having episodes of dehydration, lightheadedness and dizziness.  He denies any syncopal events.  His medications have been down titrated and since then the symptoms have essentially resolved.  He is referred to cardiology for cardiovascular work up given his multiple cardiovascular risk factors.  Patient denies any anginal discomfort or orthopnea, paroxysmal nocturnal dyspnea or lower extremity swelling.  FUNCTIONAL STATUS: No structured exercise program or daily routine.  ALLERGIES: No Known Allergies  MEDICATION LIST PRIOR TO VISIT: Current Meds  Medication Sig   Alcohol Swabs 70 % PADS Use as directed   aspirin 81 MG chewable tablet Chew 1 tablet (81 mg total) by mouth daily.   atorvastatin (LIPITOR) 40 MG tablet Take 1 tablet (40  mg total) by mouth daily.   Continuous Blood Gluc Sensor (FREESTYLE LIBRE 14 DAY SENSOR) MISC USE EVERY 14 DAYS   DILT-XR 240 MG 24 hr capsule TAKE 1 CAPSULE BY MOUTH EVERY DAY   empagliflozin (JARDIANCE) 25 MG TABS tablet Take 1 tablet (25 mg total) by mouth daily before breakfast.   glucose blood (CONTOUR NEXT TEST) test strip USE TO CHECK BLOOD SUGAR TWICE A DAY   hydrochlorothiazide (HYDRODIURIL) 12.5 MG tablet Take 1 tablet by mouth daily at 12 noon.   Insulin Syringe-Needle U-100 (INSULIN SYRINGE 1CC/31GX5/16") 31G X 5/16" 1 ML MISC Use to inject lantus daily   metFORMIN (GLUCOPHAGE-XR) 500 MG 24 hr tablet TAKE 1 TABLET BY MOUTH IN THE MORNING AND 2 TABLETS IN THE EVENING   MICROLET LANCETS MISC Use to check blood sugar 2 times a day   NOVOLOG FLEXPEN 100 UNIT/ML FlexPen INJECT 35 UNITS INTO THE SKIN IN THE MORNING, AT NOON, AND AT BEDTIME.   olmesartan (BENICAR) 40 MG tablet TAKE 1 TABLET BY MOUTH EVERY DAY   pantoprazole (PROTONIX) 40 MG tablet Take 40 mg by mouth daily.    TRESIBA FLEXTOUCH 200 UNIT/ML FlexTouch Pen INJECT 24 UNITS INTO THE SKIN AT BEDTIME     PAST MEDICAL HISTORY: Past Medical History:  Diagnosis Date   Diabetes mellitus    H/O epistaxis    History of stomach ulcers    30 YRS AGO   Hypercholesteremia    Hypertension    Shoulder pain, left    Umbilical hernia     PAST SURGICAL HISTORY: Past Surgical History:  Procedure Laterality Date   [place]  COLONOSCOPY     UMBILICAL HERNIA REPAIR N/A 07/11/2015   Procedure: LAPAROSCOPIC ASSISTED OPEN UMBILICAL HERNIA;  Surgeon: Johnathan Hausen, MD;  Location: [facility];  Service: General;  Laterality: N/A;  With MESH    FAMILY HISTORY: The patient family history includes Diabetes in his father and sister; Heart disease in his mother.  SOCIAL HISTORY:  The patient  reports that he quit smoking about 23 years ago. His smoking use included cigarettes. He has a 10.00 pack-year smoking history. He  has never used smokeless tobacco. He reports that he does not drink alcohol and does not use drugs.  REVIEW OF SYSTEMS: Review of Systems  Constitutional: Negative for chills and fever.  HENT:  Negative for hoarse voice and nosebleeds.   Eyes:  Negative for discharge, double vision and pain.  Cardiovascular:  Negative for chest pain, claudication, dyspnea on exertion, leg swelling, near-syncope, orthopnea, palpitations, paroxysmal nocturnal dyspnea and syncope.  Respiratory:  Negative for hemoptysis and shortness of breath.   Musculoskeletal:  Negative for muscle cramps and myalgias.  Gastrointestinal:  Negative for abdominal pain, constipation, diarrhea, hematemesis, hematochezia, melena, nausea and vomiting.  Neurological:  Positive for dizziness, light-headedness and paresthesias (Baseline diabetic neuropathy.).   PHYSICAL EXAM: Vitals with BMI 01/15/2021 11/30/2020 11/30/2020  Height 5\' 3"  - 5\' 3"   Weight 191 lbs - 188 lbs 6 oz  BMI 28.36 - 62.94  Systolic 765 465 035  Diastolic 76 68 76  Pulse 65 - 56   Orthostatic VS for the past 72 hrs (Last 3 readings):  Orthostatic BP Patient Position BP Location Cuff Size Orthostatic Pulse  01/15/21 1504 149/72 Standing Right Arm Normal 73  01/15/21 1503 153/75 Sitting Right Arm Normal 72  01/15/21 1502 149/75 Supine Right Arm Normal 71   CONSTITUTIONAL: Well-developed and well-nourished. No acute distress.  SKIN: Skin is warm and dry. No rash noted. No cyanosis. No pallor. No jaundice HEAD: Normocephalic and atraumatic.  EYES: No scleral icterus MOUTH/THROAT: Moist oral membranes.  NECK: No JVD present. No thyromegaly noted. No carotid bruits  LYMPHATIC: No visible cervical adenopathy.  CHEST Normal respiratory effort. No intercostal retractions  LUNGS: Clear to auscultation bilaterally.  No stridor. No wheezes. No rales.  CARDIOVASCULAR: Regular, positive W6-F6, soft holosystolic murmur heard at the apex, no gallops or rubs. ABDOMINAL:  Soft, nontender, nondistended, positive bowel sounds in all 4 quadrants, no apparent ascites.  EXTREMITIES: No peripheral edema HEMATOLOGIC: No significant bruising NEUROLOGIC: Oriented to person, place, and time. Nonfocal. Normal muscle tone.  PSYCHIATRIC: Normal mood and affect. Normal behavior. Cooperative  CARDIAC DATABASE: EKG: 01/15/2021: Normal sinus rhythm, 65 bpm, subtle T wave inversions in the inferior leads cannot rule out ischemia, without underlying injury pattern.  Echocardiogram: No results found for this or any previous visit from the past 1095 days.   Stress Testing: No results found for this or any previous visit from the past 1095 days.  Heart Catheterization: None   LABORATORY DATA: CBC Latest Ref Rng & Units 03/22/2020 03/20/2020 03/04/2019  WBC 4.0 - 10.5 K/uL 8.2 9.9 5.8  Hemoglobin 13.0 - 17.0 g/dL 14.9 16.1 15.5  Hematocrit 39.0 - 52.0 % 45.8 48.4 48.2  Platelets 150 - 400 K/uL 435(H) 440(H) 237    CMP Latest Ref Rng & Units 11/27/2020 09/26/2020 08/01/2020  Glucose 70 - 99 mg/dL 97 85 -  BUN 6 - 23 mg/dL 16 20 -  Creatinine 0.40 - 1.50 mg/dL 0.95 0.96 1.0  Sodium 135 - 145 mEq/L 137 137 -  Potassium 3.5 - 5.1 mEq/L 4.8 4.3 -  Chloride 96 - 112 mEq/L 104 104 -  CO2 19 - 32 mEq/L 24 25 -  Calcium 8.4 - 10.5 mg/dL 9.7 9.6 -  Total Protein 6.0 - 8.3 g/dL - - -  Total Bilirubin 0.2 - 1.2 mg/dL - - -  Alkaline Phos 39 - 117 U/L - - -  AST 0 - 37 U/L - - -  ALT 0 - 53 U/L - - -    Lipid Panel     Component Value Date/Time   CHOL 141 08/20/2020 1209   TRIG 87.0 08/20/2020 1209   HDL 44.70 08/20/2020 1209   CHOLHDL 3 08/20/2020 1209   VLDL 17.4 08/20/2020 1209   LDLCALC 79 08/20/2020 1209   LDLDIRECT 89.8 12/29/2013 1408    No components found for: NTPROBNP No results for input(s): PROBNP in the last 8760 hours. Recent Labs    03/22/20 0246  TSH 0.709    BMP Recent Labs    03/20/20 2050 03/22/20 0246 03/23/20 0216 04/02/20 1047  05/21/20 1002 08/01/20 0000 09/26/20 0947 11/27/20 1113  NA 132* 131* 132*   < > 137  --  137 137  K 4.2 3.7 3.6   < > 4.3  --  4.3 4.8  CL 98 97* 97*   < > 104  --  104 104  CO2 18* 23 20*   < > 25  --  25 24  GLUCOSE 142* 172* 95   < > 89  --  85 97  BUN 23 24* 28*   < > 18  --  20 16  CREATININE 1.35* 1.12 1.08   < > 0.91 1.0 0.96 0.95  CALCIUM 9.9 9.2 9.0   < > 9.7  --  9.6 9.7  GFRNONAA 58* >60 >60  --   --   --   --   --    < > = values in this interval not displayed.    HEMOGLOBIN A1C Lab Results  Component Value Date   HGBA1C 7.5 (H) 11/27/2020   MPG 162.81 03/23/2020    IMPRESSION:    ICD-10-CM   1. Dizziness  R42 EKG 12-Lead    2. Nonspecific abnormal electrocardiogram (ECG) (EKG)  R94.31 PCV ECHOCARDIOGRAM COMPLETE    PCV MYOCARDIAL PERFUSION WO LEXISCAN    3. Benign hypertension  I10     4. Mixed hyperlipidemia  E78.2     5. Type 2 diabetes mellitus with hyperglycemia, with long-term current use of insulin (HCC)  E11.65    Z79.4     6. Former smoker  Z87.891     71. Class 1 obesity due to excess calories with serious comorbidity and body mass index (BMI) of 33.0 to 33.9 in adult  E66.09    Z68.33        RECOMMENDATIONS: Austin Norris is a 65 y.o. male whose past medical history and cardiac risk factors include: Benign essential hypertension, hyperlipidemia, insulin-dependent diabetes mellitus type 2, BPH, gastroparesis, former smoker, obesity due to excess calories.  Dizziness Orthostatic vital signs negative. Suspected that his episode of dizziness/lightheaded was secondary to dehydration due to diuretic therapy. Symptoms have essentially resolved. Monitor for now  Nonspecific abnormal electrocardiogram (ECG) (EKG) Patient is noted to have very subtle T wave inversions in the inferior leads and given his cardiovascular risk factors and elevated estimated 10-year risk of ASCVD that shared decision was to proceed with ischemic  evaluation. Echocardiogram will be ordered to  evaluate for structural heart disease and left ventricular systolic function. Exercise nuclear stress test to evaluate for reversible ischemia/functional status.  Benign hypertension Office blood pressures are currently not at goal. Patient states that his home blood pressures are better controlled. Given his diabetes would recommend a systolic blood pressure goal of around 130 mmHg if able to tolerate. Low-salt diet recommended.   Mixed hyperlipidemia Currently on atorvastatin. Request records from PCP with regards to his most recent CMP, CBC, lipid profile and TSH  Type 2 diabetes mellitus with hyperglycemia, with long-term current use of insulin (HCC) Currently on ARB, Jardiance, statin therapy. Educated on the importance of glycemic control. Follows with PCP/endocrinology.  Former smoker Educated on the importance of continued smoking cessation.  Class 1 obesity due to excess calories with serious comorbidity and body mass index (BMI) of 33.0 to 33.9 in adult Body mass index is 33.83 kg/m. I reviewed with the patient the importance of diet, regular physical activity/exercise, weight loss.   Patient is educated on increasing physical activity gradually as tolerated.  With the goal of moderate intensity exercise for 30 minutes a day 5 days a week.  FINAL MEDICATION LIST END OF ENCOUNTER: No orders of the defined types were placed in this encounter.   Medications Discontinued During This Encounter  Medication Reason   gabapentin (NEURONTIN) 300 MG capsule Error   traZODone (DESYREL) 50 MG tablet Error     Current Outpatient Medications:    Alcohol Swabs 70 % PADS, Use as directed, Disp: 300 each, Rfl: 1   aspirin 81 MG chewable tablet, Chew 1 tablet (81 mg total) by mouth daily., Disp: 90 tablet, Rfl: 1   atorvastatin (LIPITOR) 40 MG tablet, Take 1 tablet (40 mg total) by mouth daily., Disp: 30 tablet, Rfl: 3   Continuous Blood  Gluc Sensor (FREESTYLE LIBRE 14 DAY SENSOR) MISC, USE EVERY 14 DAYS, Disp: 6 each, Rfl: 1   DILT-XR 240 MG 24 hr capsule, TAKE 1 CAPSULE BY MOUTH EVERY DAY, Disp: 90 capsule, Rfl: 1   empagliflozin (JARDIANCE) 25 MG TABS tablet, Take 1 tablet (25 mg total) by mouth daily before breakfast., Disp: 30 tablet, Rfl: 2   glucose blood (CONTOUR NEXT TEST) test strip, USE TO CHECK BLOOD SUGAR TWICE A DAY, Disp: 100 strip, Rfl: 2   hydrochlorothiazide (HYDRODIURIL) 12.5 MG tablet, Take 1 tablet by mouth daily at 12 noon., Disp: , Rfl:    Insulin Syringe-Needle U-100 (INSULIN SYRINGE 1CC/31GX5/16") 31G X 5/16" 1 ML MISC, Use to inject lantus daily, Disp: 90 each, Rfl: 1   metFORMIN (GLUCOPHAGE-XR) 500 MG 24 hr tablet, TAKE 1 TABLET BY MOUTH IN THE MORNING AND 2 TABLETS IN THE EVENING, Disp: 270 tablet, Rfl: 1   MICROLET LANCETS MISC, Use to check blood sugar 2 times a day, Disp: 200 each, Rfl: 1   NOVOLOG FLEXPEN 100 UNIT/ML FlexPen, INJECT 35 UNITS INTO THE SKIN IN THE MORNING, AT NOON, AND AT BEDTIME., Disp: 15 mL, Rfl: 5   olmesartan (BENICAR) 40 MG tablet, TAKE 1 TABLET BY MOUTH EVERY DAY, Disp: 90 tablet, Rfl: 1   pantoprazole (PROTONIX) 40 MG tablet, Take 40 mg by mouth daily. , Disp: , Rfl:    TRESIBA FLEXTOUCH 200 UNIT/ML FlexTouch Pen, INJECT 24 UNITS INTO THE SKIN AT BEDTIME, Disp: 9 mL, Rfl: 1   spironolactone (ALDACTONE) 25 MG tablet, Take 1 tablet (25 mg total) by mouth daily. (Patient not taking: Reported on 01/15/2021), Disp: 30 tablet, Rfl: 2  Orders Placed This  Encounter  Procedures   PCV MYOCARDIAL PERFUSION WO LEXISCAN   EKG 12-Lead   PCV ECHOCARDIOGRAM COMPLETE    There are no Patient Instructions on file for this visit.   --Continue cardiac medications as reconciled in final medication list. --Return in about 5 weeks (around 02/19/2021) for Review test results. Or sooner if needed. --Continue follow-up with your primary care physician regarding the management of your other chronic  comorbid conditions.  Patient's questions and concerns were addressed to his satisfaction. He voices understanding of the instructions provided during this encounter.   This note was created using a voice recognition software as a result there may be grammatical errors inadvertently enclosed that do not reflect the nature of this encounter. Every attempt is made to correct such errors.  As part of this consultation reviewed records provided by PCP including progress note, EKG, discuss symptoms, coordination of care.   Thank you for allowing [place] to participate in the care of [place] please reach out if any questions or concerns arise.  Rex Kras, [place], [facility]  Pager: [phone] Office: [phone]

## 2021-01-18 ENCOUNTER — Other Ambulatory Visit: Payer: Self-pay | Admitting: Endocrinology

## 2021-02-05 DIAGNOSIS — Z23 Encounter for immunization: Secondary | ICD-10-CM | POA: Diagnosis not present

## 2021-02-05 DIAGNOSIS — R202 Paresthesia of skin: Secondary | ICD-10-CM | POA: Diagnosis not present

## 2021-02-05 DIAGNOSIS — B9681 Helicobacter pylori [H. pylori] as the cause of diseases classified elsewhere: Secondary | ICD-10-CM | POA: Diagnosis not present

## 2021-02-05 DIAGNOSIS — Z794 Long term (current) use of insulin: Secondary | ICD-10-CM | POA: Diagnosis not present

## 2021-02-05 DIAGNOSIS — I10 Essential (primary) hypertension: Secondary | ICD-10-CM | POA: Diagnosis not present

## 2021-02-05 DIAGNOSIS — K2970 Gastritis, unspecified, without bleeding: Secondary | ICD-10-CM | POA: Diagnosis not present

## 2021-02-05 DIAGNOSIS — E1169 Type 2 diabetes mellitus with other specified complication: Secondary | ICD-10-CM | POA: Diagnosis not present

## 2021-02-05 DIAGNOSIS — K3184 Gastroparesis: Secondary | ICD-10-CM | POA: Diagnosis not present

## 2021-02-09 ENCOUNTER — Other Ambulatory Visit: Payer: Self-pay | Admitting: Endocrinology

## 2021-02-11 ENCOUNTER — Telehealth: Payer: Self-pay

## 2021-02-11 DIAGNOSIS — M79673 Pain in unspecified foot: Secondary | ICD-10-CM

## 2021-02-11 MED ORDER — GABAPENTIN 300 MG PO CAPS: 300.0000 mg | ORAL_CAPSULE | Freq: Three times a day (TID) | ORAL | 3 refills | Status: DC

## 2021-02-11 NOTE — Addendum Note (Signed)
Addended by: Cinda Quest on: 02/11/2021 05:05 PM   Modules accepted: Orders

## 2021-02-11 NOTE — Telephone Encounter (Signed)
Received request to refill gabapentin, but looks like Rx was discontiuned by PCP. Do you want to refill or no?

## 2021-02-12 ENCOUNTER — Other Ambulatory Visit: Payer: Self-pay

## 2021-02-12 ENCOUNTER — Ambulatory Visit: Payer: Medicare HMO

## 2021-02-12 DIAGNOSIS — R9431 Abnormal electrocardiogram [ECG] [EKG]: Secondary | ICD-10-CM | POA: Diagnosis not present

## 2021-02-15 ENCOUNTER — Ambulatory Visit: Payer: Medicare HMO | Admitting: Cardiology

## 2021-02-18 ENCOUNTER — Other Ambulatory Visit: Payer: Self-pay

## 2021-02-18 ENCOUNTER — Ambulatory Visit: Payer: Medicare HMO

## 2021-02-18 DIAGNOSIS — R9431 Abnormal electrocardiogram [ECG] [EKG]: Secondary | ICD-10-CM | POA: Diagnosis not present

## 2021-02-19 LAB — PCV MYOCARDIAL PERFUSION WO LEXISCAN: Angina Index: 0

## 2021-02-25 ENCOUNTER — Other Ambulatory Visit: Payer: Medicare HMO

## 2021-02-25 ENCOUNTER — Other Ambulatory Visit: Payer: Self-pay | Admitting: Endocrinology

## 2021-02-25 NOTE — Progress Notes (Signed)
Spoke to patient he is aware

## 2021-02-25 NOTE — Progress Notes (Signed)
Patient is aware of results.

## 2021-02-26 ENCOUNTER — Other Ambulatory Visit: Payer: Medicare HMO

## 2021-02-27 ENCOUNTER — Other Ambulatory Visit: Payer: Self-pay

## 2021-02-27 ENCOUNTER — Other Ambulatory Visit (INDEPENDENT_AMBULATORY_CARE_PROVIDER_SITE_OTHER): Payer: Medicare HMO

## 2021-02-27 DIAGNOSIS — Z794 Long term (current) use of insulin: Secondary | ICD-10-CM

## 2021-02-27 DIAGNOSIS — E7800 Pure hypercholesterolemia, unspecified: Secondary | ICD-10-CM | POA: Diagnosis not present

## 2021-02-27 DIAGNOSIS — E1165 Type 2 diabetes mellitus with hyperglycemia: Secondary | ICD-10-CM

## 2021-02-27 LAB — LIPID PANEL
Cholesterol: 143 mg/dL (ref 0–200)
HDL: 47.9 mg/dL (ref >39.00)
LDL Cholesterol: 76 mg/dL (ref 0–99)
NonHDL: 94.67
Total CHOL/HDL Ratio: 3
Triglycerides: 95 mg/dL (ref 0.0–149.0)
VLDL: 19 mg/dL (ref 0.0–40.0)

## 2021-02-27 LAB — COMPREHENSIVE METABOLIC PANEL
ALT: 15 U/L (ref 0–53)
AST: 16 U/L (ref 0–37)
Albumin: 4.3 g/dL (ref 3.5–5.2)
Alkaline Phosphatase: 71 U/L (ref 39–117)
BUN: 16 mg/dL (ref 6–23)
CO2: 26 mEq/L (ref 19–32)
Calcium: 9.3 mg/dL (ref 8.4–10.5)
Chloride: 103 mEq/L (ref 96–112)
Creatinine, Ser: 0.82 mg/dL (ref 0.40–1.50)
GFR: 91.84 mL/min (ref >60.00)
Glucose, Bld: 101 mg/dL — ABNORMAL HIGH (ref 70–99)
Potassium: 3.5 mEq/L (ref 3.5–5.1)
Sodium: 138 mEq/L (ref 135–145)
Total Bilirubin: 0.5 mg/dL (ref 0.2–1.2)
Total Protein: 6.9 g/dL (ref 6.0–8.3)

## 2021-02-27 LAB — HEMOGLOBIN A1C: Hgb A1c MFr Bld: 6.7 % — ABNORMAL HIGH (ref 4.6–6.5)

## 2021-03-04 ENCOUNTER — Encounter: Payer: Self-pay | Admitting: Endocrinology

## 2021-03-04 ENCOUNTER — Other Ambulatory Visit: Payer: Self-pay

## 2021-03-04 ENCOUNTER — Ambulatory Visit (INDEPENDENT_AMBULATORY_CARE_PROVIDER_SITE_OTHER): Payer: Medicare HMO | Admitting: Endocrinology

## 2021-03-04 VITALS — BP 148/64 | HR 60 | Ht 63.0 in | Wt 189.6 lb

## 2021-03-04 DIAGNOSIS — E7800 Pure hypercholesterolemia, unspecified: Secondary | ICD-10-CM | POA: Diagnosis not present

## 2021-03-04 DIAGNOSIS — Z794 Long term (current) use of insulin: Secondary | ICD-10-CM

## 2021-03-04 DIAGNOSIS — I10 Essential (primary) hypertension: Secondary | ICD-10-CM

## 2021-03-04 DIAGNOSIS — E1165 Type 2 diabetes mellitus with hyperglycemia: Secondary | ICD-10-CM

## 2021-03-04 MED ORDER — SPIRONOLACTONE 25 MG PO TABS: 25.0000 mg | ORAL_TABLET | Freq: Every day | ORAL | 2 refills | Status: DC

## 2021-03-04 NOTE — Patient Instructions (Addendum)
Same Austin Norris  Novolog 20-24 in am   Change HCTZ to spironolactone

## 2021-03-04 NOTE — Progress Notes (Signed)
Patient ID: Austin Norris, male   DOB: 1955/05/07, 65 y.o.   MRN: [id number]   Reason for Appointment: Follow-up   History of Present Illness   Diagnosis: Type 2 DIABETES MELITUS, long-standing     He has been on insulin to treat his diabetes for several years partly because of failure of oral agents and also for reducing cost of medications He is generally checking his blood sugars once or twice a day but does not keep a record Overall has had somewhat poor control with A1c consistently over 7%  Recent history:    Insulin regimen: Tresiba 28 units at bedtime, Novolog usually 20 units before meals  Oral hypoglycemic drugs:, metformin ER 1 g daily, Jardiance 25 mg daily  A1c is improved at 6.7 compared to 7.5   Current blood sugar patterns, management and problems identified:  He was started on Jardiance in August when he was not able to get insurance coverage for [employer] With this his blood sugars are generally better However he is still taking about the same amount of insulin doses Likely is getting more carbohydrate at times at breakfast when his blood sugars may periodically go up over 200 postprandially, has been told to have a protein in the morning instead of just bread and tea Fasting readings are improved Does not appear to be adjusting his mealtime dose based on what he is eating Is only doing some walking at work and no formal exercise     Does not do much formal exercise, only some walking at work    Side effects from medications: None         CGM device freestyle libre version 2  Interpretation of the current 2-week download as follows  Summary of patterns: Blood sugar is overall best controlled overnight with variable and mild to moderate hyperglycemia after meals especially breakfast, however blood sugars tend to stay high late afternoon before meal POSTPRANDIAL readings are not rising more than 50 mg in the mornings or 30 mg in the  evenings However HIGHEST blood sugars on an average are at bedtime  Overnight blood sugars are starting off at about 160 average and decreasing without hypoglycemia to good levels until 8 AM  He will have hypoglycemia midmorning and sporadically late afternoon and evenings Between 149 and 191 for any 2-hour segment.   HYPERGLYCEMIA has primarily seen after breakfast but also to variable extent in the afternoon and late evening Blood sugars are above the target range on the lab is only between 8 PM-10 PM No hypoglycemia with only rarely low normal readings before lunch   CGM use % of time 92  2-week average/GV 77/26  Time in range       77 %  % Time Above 180 22+1  % Time above 250   % Time Below 70 0     PRE-MEAL Fasting Lunch Dinner Bedtime Overall  Glucose range:       Averages: 125 134 155  151   POST-MEAL PC Breakfast PC Lunch PC Dinner  Glucose range:     Averages: 168 162 182   Prior  CGM use % of time 92  2-week average/GV 163/26  Time in range      69% was 92  % Time Above 180 28+3  % Time above 250   % Time Below 70 0     PRE-MEAL Fasting Lunch Dinner Bedtime Overall  Glucose range:  Averages: 154 155 152     POST-MEAL PC Breakfast PC Lunch PC Dinner  Glucose range:     Averages: 191 165 159       Meals: 2-3  meals per day, usually some form of bread and milk in the morning, relatively smaller lunch, sometimes having eggs at lunchtime., sometimes sweets in pm, fried food occasionally        Dinner usually 5-6 PM         Dietician visit: Most recent: None             Wt Readings from Last 3 Encounters:  03/04/21 189 lb 9.6 oz (86 kg)  01/15/21 191 lb (86.6 kg)  11/30/20 188 lb 6.4 oz (85.5 kg)   LABS:  Lab Results  Component Value Date   HGBA1C 6.7 (H) 02/27/2021   HGBA1C 7.5 (H) 11/27/2020   HGBA1C 7.9 (H) 08/20/2020   Lab Results  Component Value Date   MICROALBUR 1.5 05/21/2020   LDLCALC 76 02/27/2021   CREATININE 0.82 02/27/2021     Other active problems: See review of systems   Allergies as of 03/04/2021   No Known Allergies      Medication List        Accurate as of March 04, 2021  3:04 PM. If you have any questions, ask your nurse or doctor.          Alcohol Swabs 70 % Pads Use as directed   aspirin 81 MG chewable tablet Chew 1 tablet (81 mg total) by mouth daily.   atorvastatin 40 MG tablet Commonly known as: LIPITOR Take 1 tablet (40 mg total) by mouth daily.   Contour Next Test test strip Generic drug: glucose blood USE TO CHECK BLOOD SUGAR TWICE A DAY   Dilt-XR 240 MG 24 hr capsule Generic drug: diltiazem TAKE 1 CAPSULE BY MOUTH EVERY DAY   empagliflozin 25 MG Tabs tablet Commonly known as: Jardiance Take 1 tablet (25 mg total) by mouth daily before breakfast.   FreeStyle Libre 14 Day Sensor Misc USE EVERY 14 DAYS   gabapentin 300 MG capsule Commonly known as: NEURONTIN Take 1 capsule (300 mg total) by mouth 3 (three) times daily.   hydrochlorothiazide 12.5 MG tablet Commonly known as: HYDRODIURIL Take 1 tablet by mouth daily at 12 noon.   INSULIN SYRINGE 1CC/31GX5/16" 31G X 5/16" 1 ML Misc Use to inject lantus daily   metFORMIN 500 MG 24 hr tablet Commonly known as: GLUCOPHAGE-XR TAKE 1 TABLET BY MOUTH IN THE MORNING AND 2 TABLETS IN THE EVENING   Microlet Lancets Misc Use to check blood sugar 2 times a day   NovoLOG FlexPen 100 UNIT/ML FlexPen Generic drug: insulin aspart INJECT 35 UNITS INTO THE SKIN IN THE MORNING, AT NOON, AND AT BEDTIME.   olmesartan 40 MG tablet Commonly known as: BENICAR TAKE 1 TABLET BY MOUTH EVERY DAY   pantoprazole 40 MG tablet Commonly known as: PROTONIX Take 40 mg by mouth daily.   spironolactone 25 MG tablet Commonly known as: ALDACTONE Take 1 tablet (25 mg total) by mouth daily.   Tyler Aas FlexTouch 200 UNIT/ML FlexTouch Pen Generic drug: insulin degludec INJECT 24 UNITS INTO THE SKIN AT BEDTIME        Allergies: No  Known Allergies  Past Medical History:  Diagnosis Date   Diabetes mellitus    H/O epistaxis    History of stomach ulcers    30 YRS AGO   Hypercholesteremia    Hypertension  Shoulder pain, left    Umbilical hernia     Past Surgical History:  Procedure Laterality Date   CARDIAC CATHETERIZATION  1997   COLONOSCOPY     UMBILICAL HERNIA REPAIR N/A 07/11/2015   Procedure: LAPAROSCOPIC ASSISTED OPEN UMBILICAL HERNIA;  Surgeon: Johnathan Hausen, MD;  Location: [facility];  Service: General;  Laterality: N/A;  With MESH    Family History  Problem Relation Age of Onset   Heart disease Mother    Diabetes Father    Diabetes Sister    No colon cancer  Social History:  reports that he quit smoking about 23 years ago. His smoking use included cigarettes. He has a 10.00 pack-year smoking history. He has never used smokeless tobacco. He reports that he does not drink alcohol and does not use drugs.  Review of Systems:   HYPERTENSION:  he has had long-standing hypertension Has been prescribed HCTZ, diltiazem 240 and Benicar 40 mg  On his last visit he was having low normal blood pressure readings and Aldactone was stopped However despite starting Jardiance also his blood pressure is now relatively higher Apparently his blood pressure is about 027 or more systolic at home  BP Readings from Last 3 Encounters:  03/04/21 (!) 148/64  01/15/21 (!) 149/76  11/30/20 110/68   Lab Results  Component Value Date   CREATININE 0.82 02/27/2021   BUN 16 02/27/2021   NA 138 02/27/2021   K 3.5 02/27/2021   CL 103 02/27/2021   CO2 26 02/27/2021     HYPERLIPIDEMIA: The lipid abnormality consists of elevated LDL.    He  is taking generic Lipitor 20 mg daily LDL usually below 100   Has had some evidence of CAD in the past but asymptomatic, is supposed to see his cardiologist soon for new patient evaluation    Lab Results  Component Value Date   CHOL 143 02/27/2021   CHOL 141 08/20/2020    CHOL 151 04/02/2020   Lab Results  Component Value Date   HDL 47.90 02/27/2021   HDL 44.70 08/20/2020   HDL 44.40 04/02/2020   Lab Results  Component Value Date   LDLCALC 76 02/27/2021   LDLCALC 79 08/20/2020   LDLCALC 85 04/02/2020   Lab Results  Component Value Date   TRIG 95.0 02/27/2021   TRIG 87.0 08/20/2020   TRIG 107.0 04/02/2020   Lab Results  Component Value Date   CHOLHDL 3 02/27/2021   CHOLHDL 3 08/20/2020   CHOLHDL 3 04/02/2020   Lab Results  Component Value Date   LDLDIRECT 89.8 12/29/2013     He has had burning and pains in his lower legs and feet treated with gabapentin However he now says that he cannot tolerate more than 200 mg of gabapentin and lower doses do not control his symptoms He is having less symptoms and takes gabapentin only as needed  He has had 3 injections of the COVID vaccines      Examination:   BP (!) 148/64   Pulse 60   Ht 5\' 3"  (1.6 m)   Wt 189 lb 9.6 oz (86 kg)   SpO2 96%   BMI 33.59 kg/m   Body mass index is 33.59 kg/m.    ASSESSMENT/ PLAN:   Diabetes type 2 insulin-dependent  See history of present illness for detailed discussion of his current management, blood sugar patterns and problems identified  His A1c is improved and below 7 now  He is on basal bolus insulin, Jardiance and metformin  His blood sugars are improved with adding Jardiance but still has sporadic high readings after meals when he has more carbohydrate Time in range is now 77% without hypoglycemia Discussed adjusting mealtime dose based on what he is eating  Symptoms of fatigue: Etiology unclear but not clear if it may be related to low normal potassium of 3.5   HYPERTENSION: Blood pressure is significantly higher with stopping Aldactone and not as well controlled with HCTZ even with using Jardiance  Neuropathy: He has less symptoms recently possibly from better diabetes control  RECOMMENDATIONS: Also needs to make sure he has protein at  breakfast consistently Encouraged him to be walking for exercise on his days off Also on Sundays or other days when he has carbohydrates at lunch he will need to take about 10 units of NovoLog coverage Continue Jardiance 25 mg daily May need to adjust Tresiba if morning sugars are starting to get out of range.  However lower He likely needs to take 24 units of NovoLog at breakfast if eating low carbohydrate  Restart ALDACTONE and stop HCTZ  Needs follow-up potassium  Lipids: He will continue Lipitor 20 mg but also will await cardiology evaluation     There are no Patient Instructions on file for this visit.     Elayne Snare 03/04/2021, 3:04 PM   Note: This office note was prepared with Dragon voice recognition system technology. Any transcriptional errors that result from this process are unintentional.

## 2021-03-05 ENCOUNTER — Encounter: Payer: Self-pay | Admitting: Cardiology

## 2021-03-05 ENCOUNTER — Ambulatory Visit: Payer: Medicare HMO | Admitting: Cardiology

## 2021-03-05 VITALS — BP 153/71 | HR 68 | Resp 16 | Ht 63.0 in | Wt 190.6 lb

## 2021-03-05 DIAGNOSIS — Z6833 Body mass index (BMI) 33.0-33.9, adult: Secondary | ICD-10-CM | POA: Diagnosis not present

## 2021-03-05 DIAGNOSIS — R9431 Abnormal electrocardiogram [ECG] [EKG]: Secondary | ICD-10-CM

## 2021-03-05 DIAGNOSIS — Z87891 Personal history of nicotine dependence: Secondary | ICD-10-CM | POA: Diagnosis not present

## 2021-03-05 DIAGNOSIS — I10 Essential (primary) hypertension: Secondary | ICD-10-CM | POA: Diagnosis not present

## 2021-03-05 DIAGNOSIS — Z794 Long term (current) use of insulin: Secondary | ICD-10-CM

## 2021-03-05 DIAGNOSIS — E1165 Type 2 diabetes mellitus with hyperglycemia: Secondary | ICD-10-CM

## 2021-03-05 DIAGNOSIS — E782 Mixed hyperlipidemia: Secondary | ICD-10-CM | POA: Diagnosis not present

## 2021-03-05 DIAGNOSIS — E6609 Other obesity due to excess calories: Secondary | ICD-10-CM

## 2021-03-05 MED ORDER — CARVEDILOL 6.25 MG PO TABS: 6.2500 mg | ORAL_TABLET | Freq: Two times a day (BID) | ORAL | 3 refills | Status: DC

## 2021-03-05 NOTE — Progress Notes (Signed)
Date:  03/05/2021   ID:  Austin Norris, DOB 1955-12-29, MRN [id number]  PCP:  Leeroy Cha, MD  Cardiologist:  Rex Kras, DO, [facility] (established care 01/15/21)  Date: 03/05/21 Last Office Visit: 01/15/2021  Chief Complaint  Patient presents with   Results   Follow-up    HPI  Austin Norris is a 65 y.o. male who presents to the office with a chief complaint of " cardiovascular evaluation and discuss test results." Patient's past medical history and cardiovascular risk factors include: Benign essential hypertension, hyperlipidemia, insulin-dependent diabetes mellitus type 2, BPH, gastroparesis, former smoker, obesity due to excess calories.  He is referred to the office at the request of Leeroy Cha,* for evaluation of dizziness.  Patient was referred to cardiology for cardiovascular work-up given his multiple cardiovascular risk factors as discussed above.  This year decision was to proceed with echocardiogram and stress test at the last office visit. LVEF is preserved, indeterminate diastolic filling pattern, normal left atrial pressure, no significant valvular heart disease.  Stress test overall a low risk study.  Subtle perfusion finding was discussed with the patient as well.  Clinically denies any chest pain at rest or with effort related activities.  Recently his hydrochlorothiazide has been discontinued.  And endocrinology has started spironolactone and he will be starting a medication soon.  Patient also complains of having generalized aches/pain/stiffness and review of medications notes that he is on both calcium channel blockers and statin therapy.  FUNCTIONAL STATUS: No structured exercise program or daily routine.  But  joined a [employer] recently.  ALLERGIES: No Known Allergies  MEDICATION LIST PRIOR TO VISIT: Current Meds  Medication Sig   Alcohol Swabs 70 % PADS Use as directed   aspirin 81 MG chewable tablet Chew 1 tablet (81 mg total) by  mouth daily.   atorvastatin (LIPITOR) 40 MG tablet Take 1 tablet (40 mg total) by mouth daily.   carvedilol (COREG) 6.25 MG tablet Take 1 tablet (6.25 mg total) by mouth 2 (two) times daily.   Continuous Blood Gluc Sensor (FREESTYLE LIBRE 14 DAY SENSOR) MISC USE EVERY 14 DAYS   empagliflozin (JARDIANCE) 25 MG TABS tablet Take 1 tablet (25 mg total) by mouth daily before breakfast.   gabapentin (NEURONTIN) 300 MG capsule Take 1 capsule (300 mg total) by mouth 3 (three) times daily.   glucose blood (CONTOUR NEXT TEST) test strip USE TO CHECK BLOOD SUGAR TWICE A DAY   Insulin Syringe-Needle U-100 (INSULIN SYRINGE 1CC/31GX5/16") 31G X 5/16" 1 ML MISC Use to inject lantus daily   metFORMIN (GLUCOPHAGE-XR) 500 MG 24 hr tablet TAKE 1 TABLET BY MOUTH IN THE MORNING AND 2 TABLETS IN THE EVENING   metoCLOPramide (REGLAN) 10 MG tablet    MICROLET LANCETS MISC Use to check blood sugar 2 times a day   NOVOLOG FLEXPEN 100 UNIT/ML FlexPen INJECT 35 UNITS INTO THE SKIN IN THE MORNING, AT NOON, AND AT BEDTIME.   olmesartan (BENICAR) 40 MG tablet TAKE 1 TABLET BY MOUTH EVERY DAY   pantoprazole (PROTONIX) 40 MG tablet Take 40 mg by mouth daily.    spironolactone (ALDACTONE) 25 MG tablet Take 25 mg by mouth daily.   TRESIBA FLEXTOUCH 200 UNIT/ML FlexTouch Pen INJECT 24 UNITS INTO THE SKIN AT BEDTIME   [DISCONTINUED] DILT-XR 240 MG 24 hr capsule TAKE 1 CAPSULE BY MOUTH EVERY DAY     PAST MEDICAL HISTORY: Past Medical History:  Diagnosis Date   Diabetes mellitus    H/O epistaxis  History of stomach ulcers    30 YRS AGO   Hypercholesteremia    Hypertension    Shoulder pain, left    Umbilical hernia     PAST SURGICAL HISTORY: Past Surgical History:  Procedure Laterality Date   CARDIAC CATHETERIZATION  1997   COLONOSCOPY     UMBILICAL HERNIA REPAIR N/A 07/11/2015   Procedure: LAPAROSCOPIC ASSISTED OPEN UMBILICAL HERNIA;  Surgeon: Johnathan Hausen, MD;  Location: [facility];  Service: General;  Laterality:  N/A;  With MESH    FAMILY HISTORY: The patient family history includes Diabetes in his father and sister; Heart disease in his mother.  SOCIAL HISTORY:  The patient  reports that he quit smoking about 23 years ago. His smoking use included cigarettes. He has a 10.00 pack-year smoking history. He has never used smokeless tobacco. He reports that he does not drink alcohol and does not use drugs.  REVIEW OF SYSTEMS: Review of Systems  Constitutional: Negative for chills and fever.  HENT:  Negative for hoarse voice and nosebleeds.   Eyes:  Negative for discharge, double vision and pain.  Cardiovascular:  Negative for chest pain, claudication, dyspnea on exertion, leg swelling, near-syncope, orthopnea, palpitations, paroxysmal nocturnal dyspnea and syncope.  Respiratory:  Negative for hemoptysis and shortness of breath.   Musculoskeletal:  Negative for muscle cramps and myalgias.  Gastrointestinal:  Negative for abdominal pain, constipation, diarrhea, hematemesis, hematochezia, melena, nausea and vomiting.  Neurological:  Positive for dizziness, light-headedness and paresthesias (Baseline diabetic neuropathy.).   PHYSICAL EXAM: Vitals with BMI 03/05/2021 03/04/2021 01/15/2021  Height 5\' 3"  5\' 3"  5\' 3"   Weight 190 lbs 10 oz 189 lbs 10 oz 191 lbs  BMI 33.77 71.24 58.09  Systolic 983 382 505  Diastolic 71 64 76  Pulse 68 60 65   Orthostatic VS for the past 72 hrs (Last 3 readings):  Patient Position BP Location Cuff Size  03/05/21 1409 Sitting Left Arm Large   CONSTITUTIONAL: Well-developed and well-nourished. No acute distress.  SKIN: Skin is warm and dry. No rash noted. No cyanosis. No pallor. No jaundice HEAD: Normocephalic and atraumatic.  EYES: No scleral icterus MOUTH/THROAT: Moist oral membranes.  NECK: No JVD present. No thyromegaly noted. No carotid bruits  LYMPHATIC: No visible cervical adenopathy.  CHEST Normal respiratory effort. No intercostal retractions  LUNGS: Clear to  auscultation bilaterally.  No stridor. No wheezes. No rales.  CARDIOVASCULAR: Regular, positive L9-J6, soft holosystolic murmur heard at the apex, no gallops or rubs. ABDOMINAL: Soft, nontender, nondistended, positive bowel sounds in all 4 quadrants, no apparent ascites.  EXTREMITIES: No peripheral edema HEMATOLOGIC: No significant bruising NEUROLOGIC: Oriented to person, place, and time. Nonfocal. Normal muscle tone.  PSYCHIATRIC: Normal mood and affect. Normal behavior. Cooperative  CARDIAC DATABASE: EKG: 01/15/2021: Normal sinus rhythm, 65 bpm, subtle T wave inversions in the inferior leads cannot rule out ischemia, without underlying injury pattern.  Echocardiogram: 02/12/2021:  Normal LV systolic function with visual EF 60-65%. Left ventricle cavity is normal in size. Mild left ventricular hypertrophy. Normal global wall motion. Indeterminate diastolic filling pattern, normal LAP.  Left atrial cavity is mildly dilated.  Trace tricuspid regurgitation. No evidence of pulmonary hypertension.  Mild pulmonic regurgitation.  No prior study for comparison.   Stress Testing: Exercise nuclear stress test 02/18/2021:  Normal ECG stress. The patient exercised for 5 minutes and 0 seconds of a Bruce protocol, achieving approximately 7.05 METs.  The baseline blood pressure was 148/80 mmHg and increased to 230/80 mmHg, which is a  hypertensive response to exercise. Myocardial perfusion is abnormal. There is a mild degree small perfusion defect noted in the inferior wall consistent with diaphragmatic attenuation.  Mild inferoseptal ischemia in this region cannot be completely excluded. Gated SPECT imaging of the left ventricle was abnormal with calculated LVEF mildly reduced at 43%.   However visually wall motion appears to be normal.  There was no stress lung uptake and  TID was normal at 0.93. Low risk study. No previous exam available for comparison.  Heart Catheterization: None   LABORATORY  DATA: CBC Latest Ref Rng & Units 03/22/2020 03/20/2020 03/04/2019  WBC 4.0 - 10.5 K/uL 8.2 9.9 5.8  Hemoglobin 13.0 - 17.0 g/dL 14.9 16.1 15.5  Hematocrit 39.0 - 52.0 % 45.8 48.4 48.2  Platelets 150 - 400 K/uL 435(H) 440(H) 237    CMP Latest Ref Rng & Units 02/27/2021 11/27/2020 09/26/2020  Glucose 70 - 99 mg/dL 101(H) 97 85  BUN 6 - 23 mg/dL 16 16 20   Creatinine 0.40 - 1.50 mg/dL 0.82 0.95 0.96  Sodium 135 - 145 mEq/L 138 137 137  Potassium 3.5 - 5.1 mEq/L 3.5 4.8 4.3  Chloride 96 - 112 mEq/L 103 104 104  CO2 19 - 32 mEq/L 26 24 25   Calcium 8.4 - 10.5 mg/dL 9.3 9.7 9.6  Total Protein 6.0 - 8.3 g/dL 6.9 - -  Total Bilirubin 0.2 - 1.2 mg/dL 0.5 - -  Alkaline Phos 39 - 117 U/L 71 - -  AST 0 - 37 U/L 16 - -  ALT 0 - 53 U/L 15 - -    Lipid Panel     Component Value Date/Time   CHOL 143 02/27/2021 1215   TRIG 95.0 02/27/2021 1215   HDL 47.90 02/27/2021 1215   CHOLHDL 3 02/27/2021 1215   VLDL 19.0 02/27/2021 1215   LDLCALC 76 02/27/2021 1215   LDLDIRECT 89.8 12/29/2013 1408    No components found for: NTPROBNP No results for input(s): PROBNP in the last 8760 hours. Recent Labs    03/22/20 0246  TSH 0.709    BMP Recent Labs    03/20/20 2050 03/22/20 0246 03/23/20 0216 04/02/20 1047 09/26/20 0947 11/27/20 1113 02/27/21 1215  NA 132* 131* 132*   < > 137 137 138  K 4.2 3.7 3.6   < > 4.3 4.8 3.5  CL 98 97* 97*   < > 104 104 103  CO2 18* 23 20*   < > 25 24 26   GLUCOSE 142* 172* 95   < > 85 97 101*  BUN 23 24* 28*   < > 20 16 16   CREATININE 1.35* 1.12 1.08   < > 0.96 0.95 0.82  CALCIUM 9.9 9.2 9.0   < > 9.6 9.7 9.3  GFRNONAA 58* >60 >60  --   --   --   --    < > = values in this interval not displayed.    HEMOGLOBIN A1C Lab Results  Component Value Date   HGBA1C 6.7 (H) 02/27/2021   MPG 162.81 03/23/2020    IMPRESSION:    ICD-10-CM   1. Nonspecific abnormal electrocardiogram (ECG) (EKG)  R94.31     2. Benign hypertension  I10 carvedilol (COREG) 6.25 MG  tablet    3. Mixed hyperlipidemia  E78.2     4. Type 2 diabetes mellitus with hyperglycemia, with long-term current use of insulin (HCC)  E11.65    Z79.4     5. Former smoker  [id number]  6. Class 1 obesity due to excess calories with serious comorbidity and body mass index (BMI) of 33.0 to 33.9 in adult  E66.09    Z68.33         RECOMMENDATIONS: Awais VETO MACQUEEN is a 65 y.o. male whose past medical history and cardiac risk factors include: Benign essential hypertension, hyperlipidemia, insulin-dependent diabetes mellitus type 2, BPH, gastroparesis, former smoker, obesity due to excess calories.  Initially referred to cardiology for evaluation of heart disease in the setting of multiple cardiovascular risk factors.  His surface ECG noted normal sinus rhythm with subtle T wave inversions in the inferior leads, given his risk factors, and increased estimated 10-year risk of ASCVD that shared decision was to proceed with an echocardiogram and stress test.  Echocardiogram as noted above reports preserved LVEF, indeterminate diastolic filling pattern, and normal left atrial pressure, without any significant valvular heart disease.  Stress test was reported to be low risk but he noted to have a subtle perfusion abnormality and hypertensive response to exercise.  These findings were discussed with him in great detail at today's visit.  Clinically he does not have any chest pain at rest or with effort related activities.  He has recently joined the [employer] and has not had a chance to increase his physical activity.  I have asked him to start working out as tolerated by simply walking 30 minutes a day 5 days a week.  Patient's blood pressures are also elevated at today's visit and his medications were recently titrated.  Hydrochlorothiazide was discontinued and replaced with spironolactone which has not been started yet per patient.  He also complains of generalized muscle fatigue/ache/pain.  Review  of medications notes that he is on diltiazem as well as statin therapy.  Combination therapy places him at high risk of myopathy.  And therefore would recommend discontinuation of diltiazem.  We will start him on carvedilol 6.25 mg p.o. twice daily but I anticipate that he will need a higher dose.  He is asked to keep a log of his blood pressures for either myself or his PCP to review.  Otherwise his medications have been reviewed and no additional recommendations warranted at this time from a cardiovascular standpoint.  I would like to see him back in 3 months to reevaluate his symptoms.  In the interim, if his systolic blood pressures are consistently greater than 130 mmHg I have asked him to either call his PCP or myself for further medication titration.  In the interim, if he has new onset of chest pain he is asked to come in sooner for further evaluation or go to the closest ER via EMS as clinically warranted.  Patient is agreeable with the plan of care and is thankful for the care provided.  Patient is encouraged to follow-up with his PCP and other specialists as scheduled for the management of his other chronic comorbid conditions.  FINAL MEDICATION LIST END OF ENCOUNTER: Meds ordered this encounter  Medications   carvedilol (COREG) 6.25 MG tablet    Sig: Take 1 tablet (6.25 mg total) by mouth 2 (two) times daily.    Dispense:  180 tablet    Refill:  3     Medications Discontinued During This Encounter  Medication Reason   spironolactone (ALDACTONE) 25 MG tablet Error   hydrochlorothiazide (HYDRODIURIL) 12.5 MG tablet Error   DILT-XR 240 MG 24 hr capsule Change in therapy     Current Outpatient Medications:    Alcohol Swabs 70 %  PADS, Use as directed, Disp: 300 each, Rfl: 1   aspirin 81 MG chewable tablet, Chew 1 tablet (81 mg total) by mouth daily., Disp: 90 tablet, Rfl: 1   atorvastatin (LIPITOR) 40 MG tablet, Take 1 tablet (40 mg total) by mouth daily., Disp: 30 tablet, Rfl: 3    carvedilol (COREG) 6.25 MG tablet, Take 1 tablet (6.25 mg total) by mouth 2 (two) times daily., Disp: 180 tablet, Rfl: 3   Continuous Blood Gluc Sensor (FREESTYLE LIBRE 14 DAY SENSOR) MISC, USE EVERY 14 DAYS, Disp: 6 each, Rfl: 1   empagliflozin (JARDIANCE) 25 MG TABS tablet, Take 1 tablet (25 mg total) by mouth daily before breakfast., Disp: 30 tablet, Rfl: 2   gabapentin (NEURONTIN) 300 MG capsule, Take 1 capsule (300 mg total) by mouth 3 (three) times daily., Disp: 90 capsule, Rfl: 3   glucose blood (CONTOUR NEXT TEST) test strip, USE TO CHECK BLOOD SUGAR TWICE A DAY, Disp: 100 strip, Rfl: 2   Insulin Syringe-Needle U-100 (INSULIN SYRINGE 1CC/31GX5/16") 31G X 5/16" 1 ML MISC, Use to inject lantus daily, Disp: 90 each, Rfl: 1   metFORMIN (GLUCOPHAGE-XR) 500 MG 24 hr tablet, TAKE 1 TABLET BY MOUTH IN THE MORNING AND 2 TABLETS IN THE EVENING, Disp: 270 tablet, Rfl: 1   metoCLOPramide (REGLAN) 10 MG tablet, , Disp: , Rfl:    MICROLET LANCETS MISC, Use to check blood sugar 2 times a day, Disp: 200 each, Rfl: 1   NOVOLOG FLEXPEN 100 UNIT/ML FlexPen, INJECT 35 UNITS INTO THE SKIN IN THE MORNING, AT NOON, AND AT BEDTIME., Disp: 15 mL, Rfl: 5   olmesartan (BENICAR) 40 MG tablet, TAKE 1 TABLET BY MOUTH EVERY DAY, Disp: 90 tablet, Rfl: 1   pantoprazole (PROTONIX) 40 MG tablet, Take 40 mg by mouth daily. , Disp: , Rfl:    spironolactone (ALDACTONE) 25 MG tablet, Take 25 mg by mouth daily., Disp: , Rfl:    TRESIBA FLEXTOUCH 200 UNIT/ML FlexTouch Pen, INJECT 24 UNITS INTO THE SKIN AT BEDTIME, Disp: 9 mL, Rfl: 1  No orders of the defined types were placed in this encounter.   There are no Patient Instructions on file for this visit.   --Continue cardiac medications as reconciled in final medication list. --Return in about 3 months (around 06/05/2021) for Reevaluation, med changes . Or sooner if needed. --Continue follow-up with your primary care physician regarding the management of your other chronic  comorbid conditions.  Patient's questions and concerns were addressed to his satisfaction. He voices understanding of the instructions provided during this encounter.   This note was created using a voice recognition software as a result there may be grammatical errors inadvertently enclosed that do not reflect the nature of this encounter. Every attempt is made to correct such errors.  Total time spent: 35 minutes  Mechele Claude [facility]  Pager: [phone] Office: [phone]

## 2021-03-06 ENCOUNTER — Telehealth: Payer: Self-pay | Admitting: Endocrinology

## 2021-03-06 NOTE — Telephone Encounter (Signed)
Pt calling in regards to his hydrochlorothiazide (HYDRODIURIL) 12.5 MG tablet being discontinued. He wants to know what should he take in substitution.  [facility] [place]  Pt contact [phone]

## 2021-03-08 NOTE — Telephone Encounter (Signed)
Attempted to call patient unable to get answer. Dr Dwyane Dee stopped the hydrochlorothiazide and put patient back on Spirolactone.

## 2021-03-10 ENCOUNTER — Other Ambulatory Visit: Payer: Self-pay | Admitting: Endocrinology

## 2021-03-12 NOTE — Telephone Encounter (Signed)
Called patient and he has the new medication and has started taking the medication.

## 2021-03-13 ENCOUNTER — Other Ambulatory Visit: Payer: Self-pay | Admitting: Endocrinology

## 2021-03-13 DIAGNOSIS — M79673 Pain in unspecified foot: Secondary | ICD-10-CM

## 2021-03-25 ENCOUNTER — Telehealth: Payer: Self-pay | Admitting: Cardiology

## 2021-03-25 NOTE — Telephone Encounter (Signed)
Patient says his blood pressure medication was adjusted, but his blood pressure has not gone down since making the adjustments. Please call him at [phone].

## 2021-03-25 NOTE — Telephone Encounter (Signed)
Austin Norris, Can you call and get more information.  Review the medication list and BP ranges he is getting in the morning and night

## 2021-03-26 NOTE — Telephone Encounter (Signed)
I spoke to patient and he said he was on a bp medication but it was making him dizzy therefore his medication was changed but what he is on now which I believe is Carvedilol has made his bp elevated and patient stated he is still feeling dizzy he did mention he has the flu which could also be making him feel dizzy as well his most recent bp reading was 165/73

## 2021-04-01 ENCOUNTER — Telehealth: Payer: Self-pay

## 2021-04-04 NOTE — Telephone Encounter (Signed)
He was started on Coreg 6.25mg  po bid and asked to hold off diltiazem as he was started on statin medication as there are due to drug to drug interactions.  He started taking carvedilol but only once a day and noticed that his blood pressure was not well controlled.  Therefore he went back on diltiazem and has been experiencing weakness in the muscles, tired, fatigue.  For now I have asked him to take carvedilol 6.25 mg 2 tablets twice a day and to keep a log of his blood pressures and he will call us back in 1 week.   ST

## 2021-04-04 NOTE — Telephone Encounter (Signed)
Spoke to him already.

## 2021-05-05 ENCOUNTER — Other Ambulatory Visit: Payer: Self-pay | Admitting: Endocrinology

## 2021-05-08 ENCOUNTER — Other Ambulatory Visit: Payer: Self-pay

## 2021-05-08 DIAGNOSIS — E1169 Type 2 diabetes mellitus with other specified complication: Secondary | ICD-10-CM | POA: Diagnosis not present

## 2021-05-08 DIAGNOSIS — K2970 Gastritis, unspecified, without bleeding: Secondary | ICD-10-CM | POA: Diagnosis not present

## 2021-05-08 DIAGNOSIS — Z794 Long term (current) use of insulin: Secondary | ICD-10-CM | POA: Diagnosis not present

## 2021-05-08 DIAGNOSIS — R42 Dizziness and giddiness: Secondary | ICD-10-CM | POA: Diagnosis not present

## 2021-05-08 DIAGNOSIS — I10 Essential (primary) hypertension: Secondary | ICD-10-CM | POA: Diagnosis not present

## 2021-05-08 DIAGNOSIS — E1165 Type 2 diabetes mellitus with hyperglycemia: Secondary | ICD-10-CM

## 2021-05-08 DIAGNOSIS — K3184 Gastroparesis: Secondary | ICD-10-CM | POA: Diagnosis not present

## 2021-05-08 DIAGNOSIS — M79673 Pain in unspecified foot: Secondary | ICD-10-CM

## 2021-05-08 MED ORDER — NOVOLOG FLEXPEN 100 UNIT/ML ~~LOC~~ SOPN: 35.0000 [IU] | PEN_INJECTOR | Freq: Three times a day (TID) | SUBCUTANEOUS | 2 refills | Status: DC

## 2021-05-08 MED ORDER — EMPAGLIFLOZIN 25 MG PO TABS: 25.0000 mg | ORAL_TABLET | Freq: Every day | ORAL | 2 refills | Status: DC

## 2021-05-08 MED ORDER — GABAPENTIN 300 MG PO CAPS: ORAL_CAPSULE | ORAL | 2 refills | Status: DC

## 2021-06-05 ENCOUNTER — Other Ambulatory Visit: Payer: Medicare HMO

## 2021-06-07 ENCOUNTER — Ambulatory Visit: Payer: Medicare HMO | Admitting: Cardiology

## 2021-06-10 ENCOUNTER — Ambulatory Visit: Payer: Medicare HMO | Admitting: Endocrinology

## 2021-06-11 DIAGNOSIS — E119 Type 2 diabetes mellitus without complications: Secondary | ICD-10-CM | POA: Diagnosis not present

## 2021-06-11 DIAGNOSIS — H2513 Age-related nuclear cataract, bilateral: Secondary | ICD-10-CM | POA: Diagnosis not present

## 2021-06-11 DIAGNOSIS — H524 Presbyopia: Secondary | ICD-10-CM | POA: Diagnosis not present

## 2021-06-17 ENCOUNTER — Ambulatory Visit: Payer: Medicare HMO | Admitting: Cardiology

## 2021-06-25 ENCOUNTER — Other Ambulatory Visit: Payer: Self-pay | Admitting: Endocrinology

## 2021-06-25 DIAGNOSIS — H401231 Low-tension glaucoma, bilateral, mild stage: Secondary | ICD-10-CM | POA: Diagnosis not present

## 2021-06-26 ENCOUNTER — Other Ambulatory Visit: Payer: Self-pay

## 2021-06-26 ENCOUNTER — Other Ambulatory Visit (INDEPENDENT_AMBULATORY_CARE_PROVIDER_SITE_OTHER): Payer: Medicare HMO

## 2021-06-26 DIAGNOSIS — E1165 Type 2 diabetes mellitus with hyperglycemia: Secondary | ICD-10-CM | POA: Diagnosis not present

## 2021-06-26 DIAGNOSIS — Z794 Long term (current) use of insulin: Secondary | ICD-10-CM | POA: Diagnosis not present

## 2021-06-26 LAB — BASIC METABOLIC PANEL
BUN: 17 mg/dL (ref 6–23)
CO2: 26 mEq/L (ref 19–32)
Calcium: 9.4 mg/dL (ref 8.4–10.5)
Chloride: 102 mEq/L (ref 96–112)
Creatinine, Ser: 0.89 mg/dL (ref 0.40–1.50)
GFR: 89.39 mL/min (ref >60.00)
Glucose, Bld: 75 mg/dL (ref 70–99)
Potassium: 4.4 mEq/L (ref 3.5–5.1)
Sodium: 136 mEq/L (ref 135–145)

## 2021-06-26 LAB — MICROALBUMIN / CREATININE URINE RATIO
Creatinine,U: 102.3 mg/dL
Microalb Creat Ratio: 0.9 mg/g (ref 0.0–30.0)
Microalb, Ur: 1 mg/dL (ref 0.0–1.9)

## 2021-06-26 LAB — HEMOGLOBIN A1C: Hgb A1c MFr Bld: 7.4 % — ABNORMAL HIGH (ref 4.6–6.5)

## 2021-07-01 ENCOUNTER — Encounter: Payer: Self-pay | Admitting: Endocrinology

## 2021-07-01 ENCOUNTER — Other Ambulatory Visit: Payer: Self-pay

## 2021-07-01 ENCOUNTER — Ambulatory Visit (INDEPENDENT_AMBULATORY_CARE_PROVIDER_SITE_OTHER): Payer: Medicare HMO | Admitting: Endocrinology

## 2021-07-01 VITALS — BP 144/76 | HR 58 | Ht 63.0 in | Wt 185.4 lb

## 2021-07-01 DIAGNOSIS — E7800 Pure hypercholesterolemia, unspecified: Secondary | ICD-10-CM

## 2021-07-01 DIAGNOSIS — Z794 Long term (current) use of insulin: Secondary | ICD-10-CM

## 2021-07-01 DIAGNOSIS — I10 Essential (primary) hypertension: Secondary | ICD-10-CM | POA: Diagnosis not present

## 2021-07-01 DIAGNOSIS — M791 Myalgia, unspecified site: Secondary | ICD-10-CM

## 2021-07-01 DIAGNOSIS — E1165 Type 2 diabetes mellitus with hyperglycemia: Secondary | ICD-10-CM | POA: Diagnosis not present

## 2021-07-01 NOTE — Progress Notes (Signed)
Patient ID: Austin Norris, male   DOB: 09/10/1955, 66 y.o.   MRN: [id number]   Reason for Appointment: Follow-up   History of Present Illness   Diagnosis: Type 2 DIABETES MELITUS, long-standing     He has been on insulin to treat his diabetes for several years partly because of failure of oral agents and also for reducing cost of medications He is generally checking his blood sugars once or twice a day but does not keep a record Overall has had somewhat poor control with A1c consistently over 7%  Recent history:    Insulin regimen: Tresiba 28 units at bedtime, Novolog usually 20-22 units before meals  Oral hypoglycemic drugs:, metformin ER 1 g daily, Jardiance 25 mg daily  A1c is back up to 7.4 compared to 6.7   Current blood sugar patterns, management and problems identified:  He has reasonably good control in the last couple of weeks but not clear why his A1c has gone up from last time  He said that sometimes he will have high sugars from eating dates but likely has variable carbohydrate intake especially at breakfast causing periodic readings over 200 in the morning  He is not adjusting his dose of NovoLog based on how much carbohydrate he is getting He will not always need insulin to cover his lunch when he is only eating a snack  Evening readings are also variable  Blood sugars are more stable since he was started on Jardiance in 8/22  His weight is down slightly  He is not doing much exercise or walking because of fatigue  No hypoglycemia     Does not do much formal exercise, only some walking at work    Side effects from medications: None         CGM device freestyle libre version 2  Interpretation of the current 2-week download as follows  Summary of patterns: He has tendency to hyperglycemia after breakfast and has less often after lunch and dinner but otherwise blood sugars are fairly well controlled including overnight  Overnight blood sugars are  very stable and as low as 120 average in the morning without any hypoglycemia   POSTPRANDIAL readings are VARIABLY higher after breakfast about half the time over 180, this is not consistent  Has only occasional high readings after lunch and periodically POSTPRANDIAL spikes after dinner late in the evening  Premeal readings are usually mildly increased at lunch but below 140 at dinnertime  No hypoglycemia with only rarely low normal readings before 4 AM  CGM use % of time 92  2-week average/GV 148, GMI 6.9  Time in range     81   %  % Time Above 180 19  % Time above 250   % Time Below 70      PRE-MEAL Fasting Lunch Dinner Bedtime Overall  Glucose range:       Averages: 120 153 135     POST-MEAL PC Breakfast PC Lunch PC Dinner  Glucose range:     Averages: 181 153 169   Previously:  CGM use % of time 92  2-week average/GV 77/26  Time in range       77 %  % Time Above 180 22+1  % Time above 250   % Time Below 70 0     PRE-MEAL Fasting Lunch Dinner Bedtime Overall  Glucose range:       Averages: 125 134 155  151  POST-MEAL PC Breakfast PC Lunch PC Dinner  Glucose range:     Averages: 168 162 182      Meals: 2-3  meals per day, usually some form of bread and milk in the morning, relatively smaller lunch, sometimes having eggs at lunchtime., sometimes sweets in pm, fried food occasionally        Dinner usually 5-6 PM         Dietician visit: Most recent: None             Wt Readings from Last 3 Encounters:  07/01/21 185 lb 6.4 oz (84.1 kg)  03/05/21 190 lb 9.6 oz (86.5 kg)  03/04/21 189 lb 9.6 oz (86 kg)   LABS:  Lab Results  Component Value Date   HGBA1C 7.4 (H) 06/26/2021   HGBA1C 6.7 (H) 02/27/2021   HGBA1C 7.5 (H) 11/27/2020   Lab Results  Component Value Date   MICROALBUR 1.0 06/26/2021   LDLCALC 76 02/27/2021   CREATININE 0.89 06/26/2021    Other active problems: See review of systems   Allergies as of 07/01/2021   No Known Allergies       Medication List        Accurate as of July 01, 2021  8:24 PM. If you have any questions, ask your nurse or doctor.          Alcohol Swabs 70 % Pads Use as directed   aspirin 81 MG chewable tablet Chew 1 tablet (81 mg total) by mouth daily.   atorvastatin 40 MG tablet Commonly known as: LIPITOR TAKE 1 TABLET BY MOUTH EVERY DAY   carvedilol 6.25 MG tablet Commonly known as: COREG Take 1 tablet (6.25 mg total) by mouth 2 (two) times daily. What changed: how much to take   Contour Next Test test strip Generic drug: glucose blood USE TO CHECK BLOOD SUGAR TWICE A DAY   empagliflozin 25 MG Tabs tablet Commonly known as: Jardiance Take 1 tablet (25 mg total) by mouth daily before breakfast.   FreeStyle Libre 14 Day Sensor Misc USE EVERY 14 DAYS   gabapentin 300 MG capsule Commonly known as: NEURONTIN TAKE 1 CAPSULE BY MOUTH THREE TIMES A DAY   INSULIN SYRINGE 1CC/31GX5/16" 31G X 5/16" 1 ML Misc Use to inject lantus daily   metFORMIN 500 MG 24 hr tablet Commonly known as: GLUCOPHAGE-XR TAKE 1 TABLET BY MOUTH IN THE MORNING AND 2 TABLETS IN THE EVENING   metoCLOPramide 10 MG tablet Commonly known as: REGLAN   Microlet Lancets Misc Use to check blood sugar 2 times a day   NovoLOG FlexPen 100 UNIT/ML FlexPen Generic drug: insulin aspart Inject 35 Units into the skin in the morning, at noon, and at bedtime. What changed: how much to take   olmesartan 40 MG tablet Commonly known as: BENICAR TAKE 1 TABLET BY MOUTH EVERY DAY   pantoprazole 40 MG tablet Commonly known as: PROTONIX Take 40 mg by mouth daily.   spironolactone 25 MG tablet Commonly known as: ALDACTONE Take 25 mg by mouth daily.   Tyler Aas FlexTouch 200 UNIT/ML FlexTouch Pen Generic drug: insulin degludec INJECT 24 UNITS INTO THE SKIN AT BEDTIME What changed: how much to take        Allergies: No Known Allergies  Past Medical History:  Diagnosis Date   Diabetes mellitus    H/O epistaxis     History of stomach ulcers    30 YRS AGO   Hypercholesteremia    Hypertension    Shoulder  pain, left    Umbilical hernia     Past Surgical History:  Procedure Laterality Date   CARDIAC CATHETERIZATION  1997   COLONOSCOPY     UMBILICAL HERNIA REPAIR N/A 07/11/2015   Procedure: LAPAROSCOPIC ASSISTED OPEN UMBILICAL HERNIA;  Surgeon: Johnathan Hausen, MD;  Location: [facility];  Service: General;  Laterality: N/A;  With MESH    Family History  Problem Relation Age of Onset   Heart disease Mother    Diabetes Father    Diabetes Sister    No colon cancer  Social History:  reports that he quit smoking about 24 years ago. His smoking use included cigarettes. He has a 10.00 pack-year smoking history. He has never used smokeless tobacco. He reports that he does not drink alcohol and does not use drugs.  Review of Systems:   HYPERTENSION:  he has had long-standing hypertension Has been prescribed carvedilol, diltiazem 240 and Benicar 40 mg  On his last visit he was having significantly higher blood pressure readings and Aldactone was restarted  Recently his blood pressure is about 277-412 systolic at home  BP Readings from Last 3 Encounters:  07/01/21 (!) 144/76  03/05/21 (!) 153/71  03/04/21 (!) 148/64   Lab Results  Component Value Date   CREATININE 0.89 06/26/2021   BUN 17 06/26/2021   NA 136 06/26/2021   K 4.4 06/26/2021   CL 102 06/26/2021   CO2 26 06/26/2021     HYPERLIPIDEMIA: The lipid abnormality consists of elevated LDL.    He  is taking generic Lipitor 40 mg daily LDL usually below 100   Has had some evidence of CAD in the past but asymptomatic    Lab Results  Component Value Date   CHOL 143 02/27/2021   CHOL 141 08/20/2020   CHOL 151 04/02/2020   Lab Results  Component Value Date   HDL 47.90 02/27/2021   HDL 44.70 08/20/2020   HDL 44.40 04/02/2020   Lab Results  Component Value Date   LDLCALC 76 02/27/2021   LDLCALC 79 08/20/2020   LDLCALC 85  04/02/2020   Lab Results  Component Value Date   TRIG 95.0 02/27/2021   TRIG 87.0 08/20/2020   TRIG 107.0 04/02/2020   Lab Results  Component Value Date   CHOLHDL 3 02/27/2021   CHOLHDL 3 08/20/2020   CHOLHDL 3 04/02/2020   Lab Results  Component Value Date   LDLDIRECT 89.8 12/29/2013   MUSCLE aches and fatigue: He says his for the last few months he feels some aching in his muscles and fatigue although on some days he feels fairly good He left off his Lipitor for 1 week but did not feel any better  He has had burning and pains in his lower legs and feet currently less symptomatic and not treated Apparently cannot tolerate more than 200 mg of gabapentin and lower doses do not control his symptoms        Examination:   BP (!) 144/76    Pulse (!) 58    Ht '5\' 3"'$  (1.6 m)    Wt 185 lb 6.4 oz (84.1 kg)    SpO2 93%    BMI 32.84 kg/m   Body mass index is 32.84 kg/m.    ASSESSMENT/ PLAN:   Diabetes type 2 insulin-dependent  See history of present illness for detailed discussion of his current management, blood sugar patterns and problems identified  His A1c is higher at 7.4  He is on basal bolus insulin, Jardiance and  metformin  Not clear why his A1c is higher Time in range is now 81 compared to 77 % on the last visit Most of his difficulty is with postprandial hyperglycemia especially after breakfast and related to more fruit like dates or variable carbohydrate intake  Symptoms of fatigue: Etiology unclear and he needs to follow-up with his PCP   HYPERTENSION: Blood pressure is improved with restarting Aldactone  History of nonspecific GI symptoms and occasional nausea: Reportedly better after the Reglan but not clear if he is having any side effects and will try only 5 mg instead of 10 before meals   RECOMMENDATIONS for diabetes: Try to moderate portions of carbohydrates and high glycemic index foods like that he dates Continue Jardiance 25 mg daily  He likely  needs to take up to 25 units of NovoLog at breakfast if eating more fruits or other carbohydrates  Discussed needing to try and keep his postprandial readings at least under 180 with sufficient NovoLog No change in [place]  For hypertension: Consider increasing spironolactone if blood pressure continues to be relatively higher  For his fatigue he needs to discuss further evaluation with his PCP Also for his muscle aches we can try leaving off the Lipitor for at least 3 weeks and if he is significantly better will switch to another statin  Patient Instructions  Take upto 25 Novolog at breakfast and dinner  Stop Lipitor till end of month and call if muscle aches better  Try 1/2 Metclopramide     Elayne Snare 07/01/2021, 8:24 PM   Note: This office note was prepared with Dragon voice recognition system technology. Any transcriptional errors that result from this process are unintentional.

## 2021-07-01 NOTE — Patient Instructions (Addendum)
Take upto 25 Novolog at breakfast and dinner ? ?Stop Lipitor till end of month and call if muscle aches better ? ?Try 1/2 Metclopramide ?

## 2021-07-02 DIAGNOSIS — M791 Myalgia, unspecified site: Secondary | ICD-10-CM | POA: Diagnosis not present

## 2021-07-02 DIAGNOSIS — R5383 Other fatigue: Secondary | ICD-10-CM | POA: Diagnosis not present

## 2021-07-08 ENCOUNTER — Other Ambulatory Visit: Payer: Self-pay | Admitting: Endocrinology

## 2021-07-08 DIAGNOSIS — E1165 Type 2 diabetes mellitus with hyperglycemia: Secondary | ICD-10-CM

## 2021-07-10 ENCOUNTER — Ambulatory Visit: Payer: Medicare HMO | Admitting: Cardiology

## 2021-07-10 ENCOUNTER — Other Ambulatory Visit: Payer: Self-pay

## 2021-07-10 ENCOUNTER — Encounter: Payer: Self-pay | Admitting: Cardiology

## 2021-07-10 VITALS — BP 148/79 | HR 53 | Temp 97.6°F | Resp 16 | Ht 63.0 in | Wt 186.6 lb

## 2021-07-10 DIAGNOSIS — E1165 Type 2 diabetes mellitus with hyperglycemia: Secondary | ICD-10-CM | POA: Diagnosis not present

## 2021-07-10 DIAGNOSIS — Z87891 Personal history of nicotine dependence: Secondary | ICD-10-CM

## 2021-07-10 DIAGNOSIS — E6609 Other obesity due to excess calories: Secondary | ICD-10-CM | POA: Diagnosis not present

## 2021-07-10 DIAGNOSIS — Z6833 Body mass index (BMI) 33.0-33.9, adult: Secondary | ICD-10-CM | POA: Diagnosis not present

## 2021-07-10 DIAGNOSIS — I10 Essential (primary) hypertension: Secondary | ICD-10-CM

## 2021-07-10 DIAGNOSIS — E782 Mixed hyperlipidemia: Secondary | ICD-10-CM | POA: Diagnosis not present

## 2021-07-10 DIAGNOSIS — Z794 Long term (current) use of insulin: Secondary | ICD-10-CM | POA: Diagnosis not present

## 2021-07-10 MED ORDER — AMLODIPINE BESYLATE 5 MG PO TABS: 5.0000 mg | ORAL_TABLET | Freq: Every day | ORAL | 0 refills | Status: DC

## 2021-07-10 MED ORDER — ATORVASTATIN CALCIUM 20 MG PO TABS: 20.0000 mg | ORAL_TABLET | Freq: Every day | ORAL | 0 refills | Status: DC

## 2021-07-10 MED ORDER — CARVEDILOL 12.5 MG PO TABS
12.5000 mg | ORAL_TABLET | Freq: Two times a day (BID) | ORAL | 1 refills | Status: DC
Start: 2021-07-10 — End: 2021-08-06

## 2021-07-10 NOTE — Progress Notes (Signed)
? ? ?ID:  Austin Norris, DOB 28-Feb-1956, MRN [id number] ? ?PCP:  Leeroy Cha, MD  ?Cardiologist:  Rex Kras, DO, [facility] (established care 01/15/21) ? ?Date: 07/10/21 ?Last Office Visit: 03/05/2021 ? ?Chief Complaint  ?Patient presents with  ? Medication Management  ? Follow-up  ? ? ?HPI  ?Austin Norris is a 66 y.o. [place] male whose past medical history and cardiovascular risk factors include: Benign essential hypertension, hyperlipidemia, insulin-dependent diabetes mellitus type 2, BPH, gastroparesis, former smoker, obesity due to excess calories. ? ?Patient was referred to the practice for evaluation of dizziness and given his symptoms, subtle T wave versions in the inferior leads on surface ECG, and risk factors for cardiovascular disease he underwent an echo and stress test as noted below for further reference.  At last office visit the shared decision was to uptitrate his antihypertensive medications and he is requesting our assistance.  Since last office visit patient states that his blood pressure is around 140 mmHg.  He is increase his carvedilol to 12.5 mg p.o. twice daily.  About 10 days ago he stopped taking statins due to myalgias.  He has not gone back to the [facility] to increase his physical activity as he recently traveled back from [place].   ? ?FUNCTIONAL STATUS: No structured exercise program or daily routine.  ? ?ALLERGIES: ?No Known Allergies ? ?MEDICATION LIST PRIOR TO VISIT: ?Current Meds  ?Medication Sig  ? Alcohol Swabs 70 % PADS Use as directed  ? amLODipine (NORVASC) 5 MG tablet Take 1 tablet (5 mg total) by mouth daily at 10 pm.  ? aspirin 81 MG chewable tablet Chew 1 tablet (81 mg total) by mouth daily.  ? Continuous Blood Gluc Sensor (FREESTYLE LIBRE 14 DAY SENSOR) MISC USE EVERY 14 DAYS  ? empagliflozin (JARDIANCE) 25 MG TABS tablet Take 1 tablet (25 mg total) by mouth daily before breakfast.  ? gabapentin (NEURONTIN) 300 MG capsule TAKE 1 CAPSULE BY MOUTH THREE TIMES A DAY  ?  glucose blood (CONTOUR NEXT TEST) test strip USE TO CHECK BLOOD SUGAR TWICE A DAY  ? Insulin Syringe-Needle U-100 (INSULIN SYRINGE 1CC/31GX5/16") 31G X 5/16" 1 ML MISC Use to inject lantus daily  ? metFORMIN (GLUCOPHAGE-XR) 500 MG 24 hr tablet TAKE 1 TABLET BY MOUTH IN THE MORNING AND 2 TABLETS IN THE EVENING (Patient taking differently: 500 mg 3 (three) times daily.)  ? MICROLET LANCETS MISC Use to check blood sugar 2 times a day  ? olmesartan (BENICAR) 40 MG tablet TAKE 1 TABLET BY MOUTH EVERY DAY  ? pantoprazole (PROTONIX) 40 MG tablet Take 40 mg by mouth daily.   ? spironolactone (ALDACTONE) 25 MG tablet Take 25 mg by mouth daily.  ? TRESIBA FLEXTOUCH 200 UNIT/ML FlexTouch Pen INJECT 24 UNITS INTO THE SKIN AT BEDTIME (Patient taking differently: Inject 28 Units into the skin at bedtime.)  ? [DISCONTINUED] carvedilol (COREG) 6.25 MG tablet Take 1 tablet (6.25 mg total) by mouth 2 (two) times daily. (Patient taking differently: Take 6.25 mg by mouth 2 (two) times daily. 2 tablets 2 times daily)  ? [DISCONTINUED] insulin aspart (NOVOLOG FLEXPEN) 100 UNIT/ML FlexPen Inject 35 Units into the skin in the morning, at noon, and at bedtime. (Patient taking differently: Inject 20 Units into the skin in the morning, at noon, and at bedtime.)  ?  ? ?PAST MEDICAL HISTORY: ?Past Medical History:  ?Diagnosis Date  ? Diabetes mellitus   ? H/O epistaxis   ? History of stomach ulcers   ?  [place]  ? Hypercholesteremia   ? Hypertension   ? Shoulder pain, left   ? Umbilical hernia   ? ? ?PAST SURGICAL HISTORY: ?Past Surgical History:  ?Procedure Laterality Date  ? CARDIAC CATHETERIZATION  1997  ? COLONOSCOPY    ? UMBILICAL HERNIA REPAIR N/A 07/11/2015  ? Procedure: LAPAROSCOPIC ASSISTED OPEN UMBILICAL HERNIA;  Surgeon: Johnathan Hausen, MD;  Location: [facility];  Service: General;  Laterality: N/A;  With MESH  ? ? ?FAMILY HISTORY: ?The patient family history includes Diabetes in his father and sister; Heart disease in his  mother. ? ?SOCIAL HISTORY:  ?The patient  reports that he quit smoking about 24 years ago. His smoking use included cigarettes. He has a 10.00 pack-year smoking history. He has never used smokeless tobacco. He reports that he does not drink alcohol and does not use drugs. ? ?REVIEW OF SYSTEMS: ?Review of Systems  ?Cardiovascular:  Negative for chest pain, claudication, dyspnea on exertion, leg swelling, orthopnea, palpitations, paroxysmal nocturnal dyspnea and syncope.  ? ?PHYSICAL EXAM: ?Vitals with BMI 07/10/2021 07/01/2021 03/05/2021  ?Height '5\' 3"'$  '5\' 3"'$  '5\' 3"'$   ?Weight 186 lbs 10 oz 185 lbs 6 oz 190 lbs 10 oz  ?BMI 33.06 32.85 33.77  ?Systolic 025 427 062  ?Diastolic 79 76 71  ?Pulse 53 58 68  ? ? ?CONSTITUTIONAL: Well-developed and well-nourished. No acute distress.  ?SKIN: Skin is warm and dry. No rash noted. No cyanosis. No pallor. No jaundice ?HEAD: Normocephalic and atraumatic.  ?EYES: No scleral icterus ?MOUTH/THROAT: Moist oral membranes.  ?NECK: No JVD present. No thyromegaly noted. No carotid bruits  ?LYMPHATIC: No visible cervical adenopathy.  ?CHEST Normal respiratory effort. No intercostal retractions  ?LUNGS: Clear to auscultation bilaterally.  No stridor. No wheezes. No rales.  ?CARDIOVASCULAR: Regular, positive B7-S2, soft holosystolic murmur heard at the apex, no gallops or rubs. ?ABDOMINAL: Soft, nontender, nondistended, positive bowel sounds in all 4 quadrants, no apparent ascites.  ?EXTREMITIES: No peripheral edema ?HEMATOLOGIC: No significant bruising ?NEUROLOGIC: Oriented to person, place, and time. Nonfocal. Normal muscle tone.  ?PSYCHIATRIC: Normal mood and affect. Normal behavior. Cooperative ? ?CARDIAC DATABASE: ?EKG: ?01/15/2021: Normal sinus rhythm, 65 bpm, subtle T wave inversions in the inferior leads cannot rule out ischemia, without underlying injury pattern. ? ?Echocardiogram: ?02/12/2021:  ?Normal LV systolic function with visual EF 60-65%. Left ventricle cavity is normal in size. Mild  left ventricular hypertrophy. Normal global wall motion. Indeterminate diastolic filling pattern, normal LAP.  ?Left atrial cavity is mildly dilated.  ?Trace tricuspid regurgitation. No evidence of pulmonary hypertension.  ?Mild pulmonic regurgitation.  ?No prior study for comparison. ?  ?Stress Testing: ?Exercise nuclear stress test 02/18/2021:  ?Normal ECG stress. The patient exercised for 5 minutes and 0 seconds of a Bruce protocol, achieving approximately 7.05 METs.  ?The baseline blood pressure was 148/80 mmHg and increased to 230/80 mmHg, which is a hypertensive response to exercise. ?Myocardial perfusion is abnormal. There is a mild degree small perfusion defect noted in the inferior wall consistent with diaphragmatic attenuation.  Mild inferoseptal ischemia in this region cannot be completely excluded. ?Gated SPECT imaging of the left ventricle was abnormal with calculated LVEF mildly reduced at 43%.   ?However visually wall motion appears to be normal.  There was no stress lung uptake and  TID was normal at 0.93. ?Low risk study. No previous exam available for comparison. ? ?Heart Catheterization: ?None  ? ?LABORATORY DATA: ?CBC Latest Ref Rng & Units 03/22/2020 03/20/2020 03/04/2019  ?WBC 4.0 -  10.5 K/uL 8.2 9.9 5.8  ?Hemoglobin 13.0 - 17.0 g/dL 14.9 16.1 15.5  ?Hematocrit 39.0 - 52.0 % 45.8 48.4 48.2  ?Platelets 150 - 400 K/uL 435(H) 440(H) 237  ? ? ?CMP Latest Ref Rng & Units 06/26/2021 02/27/2021 11/27/2020  ?Glucose 70 - 99 mg/dL 75 101(H) 97  ?BUN 6 - 23 mg/dL '17 16 16  '$ ?Creatinine 0.40 - 1.50 mg/dL 0.89 0.82 0.95  ?Sodium 135 - 145 mEq/L 136 138 137  ?Potassium 3.5 - 5.1 mEq/L 4.4 3.5 4.8  ?Chloride 96 - 112 mEq/L 102 103 104  ?CO2 19 - 32 mEq/L '26 26 24  '$ ?Calcium 8.4 - 10.5 mg/dL 9.4 9.3 9.7  ?Total Protein 6.0 - 8.3 g/dL - 6.9 -  ?Total Bilirubin 0.2 - 1.2 mg/dL - 0.5 -  ?Alkaline Phos 39 - 117 U/L - 71 -  ?AST 0 - 37 U/L - 16 -  ?ALT 0 - 53 U/L - 15 -  ? ? ?Lipid Panel  ?   ?Component Value Date/Time  ?  CHOL 143 02/27/2021 1215  ? TRIG 95.0 02/27/2021 1215  ? HDL 47.90 02/27/2021 1215  ? CHOLHDL 3 02/27/2021 1215  ? VLDL 19.0 02/27/2021 1215  ? Orr 76 02/27/2021 1215  ? LDLDIRECT 89.8 12/29/2013 1408  ? ?

## 2021-07-11 ENCOUNTER — Encounter: Payer: Self-pay | Admitting: Endocrinology

## 2021-07-15 DIAGNOSIS — H2513 Age-related nuclear cataract, bilateral: Secondary | ICD-10-CM | POA: Diagnosis not present

## 2021-07-15 DIAGNOSIS — H25041 Posterior subcapsular polar age-related cataract, right eye: Secondary | ICD-10-CM | POA: Diagnosis not present

## 2021-07-15 DIAGNOSIS — H25013 Cortical age-related cataract, bilateral: Secondary | ICD-10-CM | POA: Diagnosis not present

## 2021-07-19 DIAGNOSIS — E1169 Type 2 diabetes mellitus with other specified complication: Secondary | ICD-10-CM | POA: Diagnosis not present

## 2021-07-19 DIAGNOSIS — E785 Hyperlipidemia, unspecified: Secondary | ICD-10-CM | POA: Diagnosis not present

## 2021-07-19 DIAGNOSIS — I10 Essential (primary) hypertension: Secondary | ICD-10-CM | POA: Diagnosis not present

## 2021-07-21 ENCOUNTER — Other Ambulatory Visit: Payer: Self-pay | Admitting: Endocrinology

## 2021-07-25 ENCOUNTER — Ambulatory Visit: Payer: Medicare HMO

## 2021-07-25 DIAGNOSIS — I10 Essential (primary) hypertension: Secondary | ICD-10-CM

## 2021-07-25 DIAGNOSIS — I700 Atherosclerosis of aorta: Secondary | ICD-10-CM

## 2021-07-26 ENCOUNTER — Other Ambulatory Visit: Payer: Medicare HMO

## 2021-08-01 ENCOUNTER — Other Ambulatory Visit: Payer: Self-pay | Admitting: Cardiology

## 2021-08-01 DIAGNOSIS — I10 Essential (primary) hypertension: Secondary | ICD-10-CM

## 2021-08-01 NOTE — Progress Notes (Signed)
Called and spoke to pt, pt voiced understanding.

## 2021-08-05 ENCOUNTER — Other Ambulatory Visit: Payer: Self-pay | Admitting: Cardiology

## 2021-08-05 ENCOUNTER — Other Ambulatory Visit: Payer: Self-pay | Admitting: Endocrinology

## 2021-08-05 DIAGNOSIS — I10 Essential (primary) hypertension: Secondary | ICD-10-CM

## 2021-08-06 ENCOUNTER — Other Ambulatory Visit: Payer: Self-pay

## 2021-08-06 MED ORDER — FREESTYLE LIBRE 14 DAY SENSOR MISC: 3 refills | Status: DC

## 2021-08-29 ENCOUNTER — Other Ambulatory Visit: Payer: Self-pay | Admitting: Cardiology

## 2021-08-29 DIAGNOSIS — I10 Essential (primary) hypertension: Secondary | ICD-10-CM

## 2021-09-01 ENCOUNTER — Other Ambulatory Visit: Payer: Self-pay | Admitting: Cardiology

## 2021-09-01 DIAGNOSIS — I10 Essential (primary) hypertension: Secondary | ICD-10-CM

## 2021-09-04 DIAGNOSIS — R0981 Nasal congestion: Secondary | ICD-10-CM | POA: Diagnosis not present

## 2021-09-04 DIAGNOSIS — R6889 Other general symptoms and signs: Secondary | ICD-10-CM | POA: Diagnosis not present

## 2021-09-04 DIAGNOSIS — Z03818 Encounter for observation for suspected exposure to other biological agents ruled out: Secondary | ICD-10-CM | POA: Diagnosis not present

## 2021-09-04 DIAGNOSIS — R52 Pain, unspecified: Secondary | ICD-10-CM | POA: Diagnosis not present

## 2021-09-04 DIAGNOSIS — R059 Cough, unspecified: Secondary | ICD-10-CM | POA: Diagnosis not present

## 2021-09-17 DIAGNOSIS — H2511 Age-related nuclear cataract, right eye: Secondary | ICD-10-CM | POA: Diagnosis not present

## 2021-09-17 DIAGNOSIS — H25011 Cortical age-related cataract, right eye: Secondary | ICD-10-CM | POA: Diagnosis not present

## 2021-09-17 DIAGNOSIS — H25811 Combined forms of age-related cataract, right eye: Secondary | ICD-10-CM | POA: Diagnosis not present

## 2021-09-17 DIAGNOSIS — H25041 Posterior subcapsular polar age-related cataract, right eye: Secondary | ICD-10-CM | POA: Diagnosis not present

## 2021-09-30 DIAGNOSIS — E1169 Type 2 diabetes mellitus with other specified complication: Secondary | ICD-10-CM | POA: Diagnosis not present

## 2021-09-30 DIAGNOSIS — I10 Essential (primary) hypertension: Secondary | ICD-10-CM | POA: Diagnosis not present

## 2021-09-30 DIAGNOSIS — G4719 Other hypersomnia: Secondary | ICD-10-CM | POA: Diagnosis not present

## 2021-10-07 ENCOUNTER — Encounter: Payer: Self-pay | Admitting: Cardiology

## 2021-10-07 ENCOUNTER — Ambulatory Visit: Payer: Medicare HMO | Admitting: Cardiology

## 2021-10-07 VITALS — BP 117/60 | HR 66 | Temp 98.0°F | Resp 16 | Ht 63.0 in | Wt 184.4 lb

## 2021-10-07 DIAGNOSIS — Z794 Long term (current) use of insulin: Secondary | ICD-10-CM

## 2021-10-07 DIAGNOSIS — E782 Mixed hyperlipidemia: Secondary | ICD-10-CM | POA: Diagnosis not present

## 2021-10-07 DIAGNOSIS — E6609 Other obesity due to excess calories: Secondary | ICD-10-CM | POA: Diagnosis not present

## 2021-10-07 DIAGNOSIS — I10 Essential (primary) hypertension: Secondary | ICD-10-CM

## 2021-10-07 DIAGNOSIS — Z87891 Personal history of nicotine dependence: Secondary | ICD-10-CM | POA: Diagnosis not present

## 2021-10-07 DIAGNOSIS — E1165 Type 2 diabetes mellitus with hyperglycemia: Secondary | ICD-10-CM | POA: Diagnosis not present

## 2021-10-07 DIAGNOSIS — Z6832 Body mass index (BMI) 32.0-32.9, adult: Secondary | ICD-10-CM | POA: Diagnosis not present

## 2021-10-07 NOTE — Progress Notes (Signed)
ID:  Austin Norris, DOB Aug 23, 1955, MRN [id number]  PCP:  Lorenda Ishihara, MD  Cardiologist:  Tessa Lerner, DO, [facility] (established care 01/15/21)  Date: 10/07/21 Last Office Visit: 07/10/2021  Chief Complaint  Patient presents with   Hypertension   Follow-up    HPI  Austin Norris is a 66 y.o. [place] male whose past medical history and cardiovascular risk factors include: Benign essential hypertension, hyperlipidemia, insulin-dependent diabetes mellitus type 2, BPH, gastroparesis, former smoker, obesity due to excess calories.  Patient presents today for 70-month follow-up visit for blood pressure management.  After up titration of medical therapy at the last office visit his blood pressures have remained in very good control according to him.  He is tolerating the medical therapy well without any side effects or intolerances.  He has been evaluated by sleep medicine and is awaiting a sleep study.  He makes a conscious effort to go to the [facility] but has not been religious.  He denies anginal discomfort or heart failure symptoms.  In the past patient did have a stress test which noted diaphragmatic attenuation and mild inferoseptal ischemia which cannot be completely excluded.  We have discussed additional work-up but given the fact that he has been asymptomatic with regards to anginal discomfort the shared decision was to hold off and treat his comorbid conditions.  He is also tolerated Lipitor 20 mg p.o. nightly well without any side effects or intolerances.  FUNCTIONAL STATUS: No structured exercise program or daily routine.   ALLERGIES: No Known Allergies  MEDICATION LIST PRIOR TO VISIT: Current Meds  Medication Sig   Alcohol Swabs 70 % PADS Use as directed   amLODipine (NORVASC) 5 MG tablet TAKE 1 TABLET BY MOUTH EVERY DAY AT 10PM   aspirin 81 MG chewable tablet Chew 1 tablet (81 mg total) by mouth daily.   atorvastatin (LIPITOR) 20 MG tablet Take 1 tablet (20 mg  total) by mouth at bedtime.   carvedilol (COREG) 12.5 MG tablet TAKE 1 TABLET BY MOUTH 2 TIMES DAILY.   Continuous Blood Gluc Sensor (FREESTYLE LIBRE 14 DAY SENSOR) MISC USE EVERY 14 DAYS AS DIRECTED  DX E11.65   empagliflozin (JARDIANCE) 25 MG TABS tablet Take 1 tablet (25 mg total) by mouth daily before breakfast.   gabapentin (NEURONTIN) 300 MG capsule TAKE 1 CAPSULE BY MOUTH THREE TIMES A DAY   glucose blood (CONTOUR NEXT TEST) test strip USE TO CHECK BLOOD SUGAR TWICE A DAY   insulin aspart (NOVOLOG FLEXPEN) 100 UNIT/ML FlexPen Use up to 25 units before breakfast and dinner   Insulin Syringe-Needle U-100 (INSULIN SYRINGE 1CC/31GX5/16") 31G X 5/16" 1 ML MISC Use to inject lantus daily   metFORMIN (GLUCOPHAGE-XR) 500 MG 24 hr tablet TAKE 1 TABLET BY MOUTH IN THE MORNING AND 2 TABLETS IN THE EVENING   MICROLET LANCETS MISC Use to check blood sugar 2 times a day   olmesartan (BENICAR) 40 MG tablet TAKE 1 TABLET BY MOUTH EVERY DAY   pantoprazole (PROTONIX) 40 MG tablet Take 40 mg by mouth daily.    spironolactone (ALDACTONE) 25 MG tablet Take 25 mg by mouth daily.   TRESIBA FLEXTOUCH 200 UNIT/ML FlexTouch Pen INJECT 24 UNITS INTO THE SKIN AT BEDTIME (Patient taking differently: Inject 28 Units into the skin at bedtime.)     PAST MEDICAL HISTORY: Past Medical History:  Diagnosis Date   Diabetes mellitus    H/O epistaxis    History of stomach ulcers    30 YRS  AGO   Hypercholesteremia    Hypertension    Shoulder pain, left    Umbilical hernia     PAST SURGICAL HISTORY: Past Surgical History:  Procedure Laterality Date   CARDIAC CATHETERIZATION  1997   COLONOSCOPY     UMBILICAL HERNIA REPAIR N/A 07/11/2015   Procedure: LAPAROSCOPIC ASSISTED OPEN UMBILICAL HERNIA;  Surgeon: Johnathan Hausen, MD;  Location: [facility];  Service: General;  Laterality: N/A;  With MESH    FAMILY HISTORY: The patient family history includes Diabetes in his father and sister; Heart disease in his  mother.  SOCIAL HISTORY:  The patient  reports that he quit smoking about 24 years ago. His smoking use included cigarettes. He has a 10.00 pack-year smoking history. He has never used smokeless tobacco. He reports that he does not drink alcohol and does not use drugs.  REVIEW OF SYSTEMS: Review of Systems  Cardiovascular:  Negative for chest pain, claudication, dyspnea on exertion, leg swelling, orthopnea, palpitations, paroxysmal nocturnal dyspnea and syncope.    PHYSICAL EXAM:    10/07/2021   10:22 AM 07/10/2021   11:46 AM 07/01/2021    2:30 PM  Vitals with BMI  Height $Remov'5\' 3"'hnWJnC$  $Remove'5\' 3"'taCKsKv$  $RemoveB'5\' 3"'AjFxEVsW$   Weight 184 lbs 6 oz 186 lbs 10 oz 185 lbs 6 oz  BMI 32.67 21.03 12.81  Systolic 188 677 373  Diastolic 60 79 76  Pulse 66 53 58    CONSTITUTIONAL: Well-developed and well-nourished. No acute distress.  SKIN: Skin is warm and dry. No rash noted. No cyanosis. No pallor. No jaundice HEAD: Normocephalic and atraumatic.  EYES: No scleral icterus MOUTH/THROAT: Moist oral membranes.  NECK: No JVD present. No thyromegaly noted. No carotid bruits  CHEST Normal respiratory effort. No intercostal retractions  LUNGS: Clear to auscultation bilaterally.  No stridor. No wheezes. No rales.  CARDIOVASCULAR: Regular, positive G6-K1, soft holosystolic murmur heard at the apex, no gallops or rubs. ABDOMINAL: Soft, nontender, nondistended, positive bowel sounds in all 4 quadrants, no apparent ascites.  EXTREMITIES: No peripheral edema HEMATOLOGIC: No significant bruising NEUROLOGIC: Oriented to person, place, and time. Nonfocal. Normal muscle tone.  PSYCHIATRIC: Normal mood and affect. Normal behavior. Cooperative  CARDIAC DATABASE: EKG: 10/07/2021: NSR, 60 bpm, without underlying ischemia injury pattern.    Echocardiogram: 02/12/2021:  Normal LV systolic function with visual EF 60-65%. Left ventricle cavity is normal in size. Mild left ventricular hypertrophy. Normal global wall motion. Indeterminate  diastolic filling pattern, normal LAP.  Left atrial cavity is mildly dilated.  Trace tricuspid regurgitation. No evidence of pulmonary hypertension.  Mild pulmonic regurgitation.  No prior study for comparison.   Stress Testing: Exercise nuclear stress test 02/18/2021:  Normal ECG stress. The patient exercised for 5 minutes and 0 seconds of a Bruce protocol, achieving approximately 7.05 METs.  The baseline blood pressure was 148/80 mmHg and increased to 230/80 mmHg, which is a hypertensive response to exercise. Myocardial perfusion is abnormal. There is a mild degree small perfusion defect noted in the inferior wall consistent with diaphragmatic attenuation.  Mild inferoseptal ischemia in this region cannot be completely excluded. Gated SPECT imaging of the left ventricle was abnormal with calculated LVEF mildly reduced at 43%.   However visually wall motion appears to be normal.  There was no stress lung uptake and  TID was normal at 0.93. Low risk study. No previous exam available for comparison.  Heart Catheterization: None   Renal artery duplex 07/25/2021: No evidence of renal artery occlusive disease in either renal artery.  Normal intrarenal vascular perfusion is noted in both kidneys. Normal sized kidneys bilateral.  Diffuse aortic atherosclerosis.  LABORATORY DATA:    Latest Ref Rng & Units 03/22/2020    2:46 AM 03/20/2020    8:50 PM 03/04/2019    8:54 PM  CBC  WBC 4.0 - 10.5 K/uL 8.2  9.9  5.8   Hemoglobin 13.0 - 17.0 g/dL 14.9  16.1  15.5   Hematocrit 39.0 - 52.0 % 45.8  48.4  48.2   Platelets 150 - 400 K/uL 435  440  237        Latest Ref Rng & Units 06/26/2021    3:16 PM 02/27/2021   12:15 PM 11/27/2020   11:13 AM  CMP  Glucose 70 - 99 mg/dL 75  101  97   BUN 6 - 23 mg/dL $Remove'17  16  16   'qcQMAkL$ Creatinine 0.40 - 1.50 mg/dL 0.89  0.82  0.95   Sodium 135 - 145 mEq/L 136  138  137   Potassium 3.5 - 5.1 mEq/L 4.4  3.5  4.8   Chloride 96 - 112 mEq/L 102  103  104   CO2 19 - 32  mEq/L $Remo'26  26  24   'FqXDr$ Calcium 8.4 - 10.5 mg/dL 9.4  9.3  9.7   Total Protein 6.0 - 8.3 g/dL  6.9    Total Bilirubin 0.2 - 1.2 mg/dL  0.5    Alkaline Phos 39 - 117 U/L  71    AST 0 - 37 U/L  16    ALT 0 - 53 U/L  15      Lipid Panel     Component Value Date/Time   CHOL 143 02/27/2021 1215   TRIG 95.0 02/27/2021 1215   HDL 47.90 02/27/2021 1215   CHOLHDL 3 02/27/2021 1215   VLDL 19.0 02/27/2021 1215   LDLCALC 76 02/27/2021 1215   LDLDIRECT 89.8 12/29/2013 1408    No components found for: "NTPROBNP" No results for input(s): "PROBNP" in the last 8760 hours. No results for input(s): "TSH" in the last 8760 hours.   BMP Recent Labs    11/27/20 1113 02/27/21 1215 06/26/21 1516  NA 137 138 136  K 4.8 3.5 4.4  CL 104 103 102  CO2 $Re'24 26 26  'FNI$ GLUCOSE 97 101* 75  BUN $Re'16 16 17  'mWt$ CREATININE 0.95 0.82 0.89  CALCIUM 9.7 9.3 9.4    HEMOGLOBIN A1C Lab Results  Component Value Date   HGBA1C 7.4 (H) 06/26/2021   MPG 162.81 03/23/2020    IMPRESSION:    ICD-10-CM   1. Benign hypertension  I10 EKG 12-Lead    2. Mixed hyperlipidemia  E78.2 Lipid Panel With LDL/HDL Ratio    LDL cholesterol, direct    CMP14+EGFR    3. Type 2 diabetes mellitus with hyperglycemia, with long-term current use of insulin (HCC)  E11.65    Z79.4     4. Former smoker  Z87.891     5. Class 1 obesity due to excess calories with serious comorbidity and body mass index (BMI) of 32.0 to 32.9 in adult  E66.09    Z68.32         RECOMMENDATIONS: Austin Norris is a 66 y.o. male whose past medical history and cardiac risk factors include: Benign essential hypertension, hyperlipidemia, insulin-dependent diabetes mellitus type 2, BPH, gastroparesis, former smoker, obesity due to excess calories.  Benign hypertension Home and office blood pressures have improved significantly with medication titration. Monitor for now.  Mixed  hyperlipidemia Currently on Lipitor 20 mg p.o. nightly.   He denies myalgia or  other side effects. We will repeat fasting lipid profile and LFTs.  If labs are stable continue current dose  Type 2 diabetes mellitus with hyperglycemia, with long-term current use of insulin ([place]) Education the importance of glycemic control. Currently managed by primary care provider. Currently on statin therapy, carvedilol, Jardiance, ARB  Class 1 obesity due to excess calories with serious comorbidity and body mass index (BMI) of 32.0 to 32.9 in adult Body mass index is 32.66 kg/m. Patient is encouraged to go to the [facility] regularly and increase physical activity as tolerated with a goal of 150 minutes/week of moderate intensity exercise. I reviewed with the patient the importance of diet, regular physical activity/exercise, weight loss.    FINAL MEDICATION LIST END OF ENCOUNTER: No orders of the defined types were placed in this encounter.    Medications Discontinued During This Encounter  Medication Reason   metoCLOPramide (REGLAN) 10 MG tablet      Current Outpatient Medications:    Alcohol Swabs 70 % PADS, Use as directed, Disp: 300 each, Rfl: 1   amLODipine (NORVASC) 5 MG tablet, TAKE 1 TABLET BY MOUTH EVERY DAY AT 10PM, Disp: 30 tablet, Rfl: 0   aspirin 81 MG chewable tablet, Chew 1 tablet (81 mg total) by mouth daily., Disp: 90 tablet, Rfl: 1   atorvastatin (LIPITOR) 20 MG tablet, Take 1 tablet (20 mg total) by mouth at bedtime., Disp: 90 tablet, Rfl: 0   carvedilol (COREG) 12.5 MG tablet, TAKE 1 TABLET BY MOUTH 2 TIMES DAILY., Disp: 180 tablet, Rfl: 3   Continuous Blood Gluc Sensor (FREESTYLE LIBRE 14 DAY SENSOR) MISC, USE EVERY 14 DAYS AS DIRECTED  DX E11.65, Disp: 6 each, Rfl: 3   empagliflozin (JARDIANCE) 25 MG TABS tablet, Take 1 tablet (25 mg total) by mouth daily before breakfast., Disp: 90 tablet, Rfl: 2   gabapentin (NEURONTIN) 300 MG capsule, TAKE 1 CAPSULE BY MOUTH THREE TIMES A DAY, Disp: 270 capsule, Rfl: 2   glucose blood (CONTOUR NEXT TEST) test strip, USE TO  CHECK BLOOD SUGAR TWICE A DAY, Disp: 100 strip, Rfl: 2   insulin aspart (NOVOLOG FLEXPEN) 100 UNIT/ML FlexPen, Use up to 25 units before breakfast and dinner, Disp: 45 mL, Rfl: 2   Insulin Syringe-Needle U-100 (INSULIN SYRINGE 1CC/31GX5/16") 31G X 5/16" 1 ML MISC, Use to inject lantus daily, Disp: 90 each, Rfl: 1   metFORMIN (GLUCOPHAGE-XR) 500 MG 24 hr tablet, TAKE 1 TABLET BY MOUTH IN THE MORNING AND 2 TABLETS IN THE EVENING, Disp: 270 tablet, Rfl: 1   MICROLET LANCETS MISC, Use to check blood sugar 2 times a day, Disp: 200 each, Rfl: 1   olmesartan (BENICAR) 40 MG tablet, TAKE 1 TABLET BY MOUTH EVERY DAY, Disp: 90 tablet, Rfl: 1   pantoprazole (PROTONIX) 40 MG tablet, Take 40 mg by mouth daily. , Disp: , Rfl:    spironolactone (ALDACTONE) 25 MG tablet, Take 25 mg by mouth daily., Disp: , Rfl:    TRESIBA FLEXTOUCH 200 UNIT/ML FlexTouch Pen, INJECT 24 UNITS INTO THE SKIN AT BEDTIME (Patient taking differently: Inject 28 Units into the skin at bedtime.), Disp: 9 mL, Rfl: 1  Orders Placed This Encounter  Procedures   Lipid Panel With LDL/HDL Ratio   LDL cholesterol, direct   CMP14+EGFR   EKG 12-Lead    There are no Patient Instructions on file for this visit.   --Continue cardiac medications as reconciled  in final medication list. --Return in about 6 months (around 04/08/2022) for Follow up HTN HLD. Or sooner if needed. --Continue follow-up with your primary care physician regarding the management of your other chronic comorbid conditions.  Patient's questions and concerns were addressed to his satisfaction. He voices understanding of the instructions provided during this encounter.   This note was created using a voice recognition software as a result there may be grammatical errors inadvertently enclosed that do not reflect the nature of this encounter. Every attempt is made to correct such errors.   Rex Kras, [place], [facility]  Pager: [phone] Office: [phone]

## 2021-10-09 DIAGNOSIS — E782 Mixed hyperlipidemia: Secondary | ICD-10-CM | POA: Diagnosis not present

## 2021-10-10 LAB — CMP14+EGFR
ALT: 15 IU/L (ref 0–44)
AST: 18 IU/L (ref 0–40)
Albumin/Globulin Ratio: 2 (ref 1.2–2.2)
Albumin: 4.3 g/dL (ref 3.8–4.8)
Alkaline Phosphatase: 93 IU/L (ref 44–121)
BUN/Creatinine Ratio: 15 (ref 10–24)
BUN: 13 mg/dL (ref 8–27)
Bilirubin Total: 0.4 mg/dL (ref 0.0–1.2)
CO2: 22 mmol/L (ref 20–29)
Calcium: 9.1 mg/dL (ref 8.6–10.2)
Chloride: 99 mmol/L (ref 96–106)
Creatinine, Ser: 0.85 mg/dL (ref 0.76–1.27)
Globulin, Total: 2.2 g/dL (ref 1.5–4.5)
Glucose: 157 mg/dL — ABNORMAL HIGH (ref 70–99)
Potassium: 4.8 mmol/L (ref 3.5–5.2)
Sodium: 136 mmol/L (ref 134–144)
Total Protein: 6.5 g/dL (ref 6.0–8.5)
eGFR: 96 mL/min/{1.73_m2} (ref >59)

## 2021-10-10 LAB — LIPID PANEL WITH LDL/HDL RATIO
Cholesterol, Total: 173 mg/dL (ref 100–199)
HDL: 49 mg/dL (ref >39)
LDL Chol Calc (NIH): 102 mg/dL — ABNORMAL HIGH (ref 0–99)
LDL/HDL Ratio: 2.1 ratio (ref 0.0–3.6)
Triglycerides: 121 mg/dL (ref 0–149)
VLDL Cholesterol Cal: 22 mg/dL (ref 5–40)

## 2021-10-10 LAB — LDL CHOLESTEROL, DIRECT: LDL Direct: 109 mg/dL — ABNORMAL HIGH (ref 0–99)

## 2021-10-16 DIAGNOSIS — N400 Enlarged prostate without lower urinary tract symptoms: Secondary | ICD-10-CM | POA: Diagnosis not present

## 2021-10-16 DIAGNOSIS — E1169 Type 2 diabetes mellitus with other specified complication: Secondary | ICD-10-CM | POA: Diagnosis not present

## 2021-10-16 DIAGNOSIS — E785 Hyperlipidemia, unspecified: Secondary | ICD-10-CM | POA: Diagnosis not present

## 2021-10-16 DIAGNOSIS — I10 Essential (primary) hypertension: Secondary | ICD-10-CM | POA: Diagnosis not present

## 2021-10-17 ENCOUNTER — Ambulatory Visit
Admission: RE | Admit: 2021-10-17 | Discharge: 2021-10-17 | Disposition: A | Discharge status: Home / Self Care | Payer: Medicare HMO | Source: Ambulatory Visit | Attending: Internal Medicine | Admitting: Internal Medicine

## 2021-10-17 ENCOUNTER — Other Ambulatory Visit: Payer: Self-pay | Admitting: Internal Medicine

## 2021-10-17 DIAGNOSIS — E1169 Type 2 diabetes mellitus with other specified complication: Secondary | ICD-10-CM | POA: Diagnosis not present

## 2021-10-17 DIAGNOSIS — M545 Low back pain, unspecified: Secondary | ICD-10-CM | POA: Diagnosis not present

## 2021-10-17 DIAGNOSIS — I10 Essential (primary) hypertension: Secondary | ICD-10-CM | POA: Diagnosis not present

## 2021-10-23 ENCOUNTER — Other Ambulatory Visit: Payer: Self-pay | Admitting: Cardiology

## 2021-10-23 DIAGNOSIS — E1165 Type 2 diabetes mellitus with hyperglycemia: Secondary | ICD-10-CM

## 2021-10-23 DIAGNOSIS — E782 Mixed hyperlipidemia: Secondary | ICD-10-CM

## 2021-10-23 DIAGNOSIS — I10 Essential (primary) hypertension: Secondary | ICD-10-CM

## 2021-10-23 MED ORDER — ATORVASTATIN CALCIUM 40 MG PO TABS: 40.0000 mg | ORAL_TABLET | Freq: Every day | ORAL | 0 refills | Status: DC

## 2021-10-25 DIAGNOSIS — E669 Obesity, unspecified: Secondary | ICD-10-CM | POA: Diagnosis not present

## 2021-10-25 DIAGNOSIS — I10 Essential (primary) hypertension: Secondary | ICD-10-CM | POA: Diagnosis not present

## 2021-10-25 DIAGNOSIS — G4733 Obstructive sleep apnea (adult) (pediatric): Secondary | ICD-10-CM | POA: Diagnosis not present

## 2021-10-27 ENCOUNTER — Other Ambulatory Visit: Payer: Self-pay | Admitting: Cardiology

## 2021-10-27 DIAGNOSIS — I10 Essential (primary) hypertension: Secondary | ICD-10-CM

## 2021-12-02 ENCOUNTER — Other Ambulatory Visit: Payer: Self-pay | Admitting: Cardiology

## 2021-12-02 DIAGNOSIS — I10 Essential (primary) hypertension: Secondary | ICD-10-CM

## 2021-12-23 ENCOUNTER — Other Ambulatory Visit (INDEPENDENT_AMBULATORY_CARE_PROVIDER_SITE_OTHER): Payer: Medicare HMO

## 2021-12-23 DIAGNOSIS — E7800 Pure hypercholesterolemia, unspecified: Secondary | ICD-10-CM

## 2021-12-23 DIAGNOSIS — E1165 Type 2 diabetes mellitus with hyperglycemia: Secondary | ICD-10-CM | POA: Diagnosis not present

## 2021-12-23 DIAGNOSIS — Z794 Long term (current) use of insulin: Secondary | ICD-10-CM

## 2021-12-23 LAB — COMPREHENSIVE METABOLIC PANEL
ALT: 16 U/L (ref 0–53)
AST: 19 U/L (ref 0–37)
Albumin: 4.5 g/dL (ref 3.5–5.2)
Alkaline Phosphatase: 86 U/L (ref 39–117)
BUN: 14 mg/dL (ref 6–23)
CO2: 27 mEq/L (ref 19–32)
Calcium: 9.6 mg/dL (ref 8.4–10.5)
Chloride: 101 mEq/L (ref 96–112)
Creatinine, Ser: 0.96 mg/dL (ref 0.40–1.50)
GFR: 82.17 mL/min (ref >60.00)
Glucose, Bld: 100 mg/dL — ABNORMAL HIGH (ref 70–99)
Potassium: 4.7 mEq/L (ref 3.5–5.1)
Sodium: 137 mEq/L (ref 135–145)
Total Bilirubin: 0.4 mg/dL (ref 0.2–1.2)
Total Protein: 7.4 g/dL (ref 6.0–8.3)

## 2021-12-23 LAB — HEMOGLOBIN A1C: Hgb A1c MFr Bld: 7.7 % — ABNORMAL HIGH (ref 4.6–6.5)

## 2021-12-23 LAB — LIPID PANEL
Cholesterol: 176 mg/dL (ref 0–200)
HDL: 51.6 mg/dL (ref >39.00)
LDL Cholesterol: 105 mg/dL — ABNORMAL HIGH (ref 0–99)
NonHDL: 124.01
Total CHOL/HDL Ratio: 3
Triglycerides: 94 mg/dL (ref 0.0–149.0)
VLDL: 18.8 mg/dL (ref 0.0–40.0)

## 2021-12-24 DIAGNOSIS — G4733 Obstructive sleep apnea (adult) (pediatric): Secondary | ICD-10-CM | POA: Diagnosis not present

## 2021-12-24 DIAGNOSIS — N400 Enlarged prostate without lower urinary tract symptoms: Secondary | ICD-10-CM | POA: Diagnosis not present

## 2021-12-24 DIAGNOSIS — Z23 Encounter for immunization: Secondary | ICD-10-CM | POA: Diagnosis not present

## 2021-12-24 DIAGNOSIS — G4719 Other hypersomnia: Secondary | ICD-10-CM | POA: Diagnosis not present

## 2021-12-24 DIAGNOSIS — E669 Obesity, unspecified: Secondary | ICD-10-CM | POA: Diagnosis not present

## 2021-12-24 DIAGNOSIS — Z0000 Encounter for general adult medical examination without abnormal findings: Secondary | ICD-10-CM | POA: Diagnosis not present

## 2021-12-24 DIAGNOSIS — I10 Essential (primary) hypertension: Secondary | ICD-10-CM | POA: Diagnosis not present

## 2021-12-24 DIAGNOSIS — E1169 Type 2 diabetes mellitus with other specified complication: Secondary | ICD-10-CM | POA: Diagnosis not present

## 2021-12-24 DIAGNOSIS — E785 Hyperlipidemia, unspecified: Secondary | ICD-10-CM | POA: Diagnosis not present

## 2021-12-25 ENCOUNTER — Ambulatory Visit: Payer: Medicare HMO | Admitting: Endocrinology

## 2021-12-25 VITALS — BP 118/68 | HR 55 | Ht 63.0 in | Wt 185.0 lb

## 2021-12-25 DIAGNOSIS — E1165 Type 2 diabetes mellitus with hyperglycemia: Secondary | ICD-10-CM | POA: Diagnosis not present

## 2021-12-25 DIAGNOSIS — Z794 Long term (current) use of insulin: Secondary | ICD-10-CM | POA: Diagnosis not present

## 2021-12-25 DIAGNOSIS — E7800 Pure hypercholesterolemia, unspecified: Secondary | ICD-10-CM | POA: Diagnosis not present

## 2021-12-25 DIAGNOSIS — I10 Essential (primary) hypertension: Secondary | ICD-10-CM

## 2021-12-25 NOTE — Patient Instructions (Addendum)
Novolog 28 in am unless eating protein  Tresiba 30 units  Start OZEMPIC injections by dialing 0.25 mg on the pen as shown once weekly on the same day of the week.   You may inject in the sides of the stomach, outer thigh or arm as indicated in the brochure given. If you have any difficulties using the pen see the video at [email or web address].[email or web address].za  You will feel fullness of the stomach with starting the medication and should try to keep the portions at meals small.  You may experience nausea in the first few days which usually gets better over time    After 4 weeks increase the dose to 0.5 mg weekly  If you have any questions or persistent side effects please call the office   If your blood sugars are starting to get low after starting Ozempic to reduce the NovoLog by 4 to 6 units

## 2021-12-25 NOTE — Progress Notes (Signed)
Patient ID: Heloise Purpura, male   DOB: [birth year], 66 y.o.   MRN: [id number]   Reason for Appointment: Follow-up   History of Present Illness   Diagnosis: Type 2 DIABETES MELITUS, long-standing     He has been on insulin to treat his diabetes for several years partly because of failure of oral agents and also for reducing cost of medications He is generally checking his blood sugars once or twice a day but does not keep a record Overall has had somewhat poor control with A1c consistently over 7%  Recent history:    Insulin regimen: Tresiba 28 units at bedtime, Novolog usually 20-22 units before meals  Oral hypoglycemic drugs:, metformin ER 1 g daily, Jardiance 25 mg daily  A1c is 7.7, appears to be gradually increasing on the last 2 visits Last visit was in March    Current blood sugar patterns, management and problems identified:  He says that when he was in the [place] he was walking a lot and requiring much less insulin However overall control is not as good and only 68% within target range on his sensor lately compared to 81% previously  Most of his overnight blood sugars are high although slightly better in the last couple of days Highest blood sugars are after breakfast and periodically after dinner In the morning he is generally eating some kind of bread and also milk without any protein usually He will get some protein with eggs at lunchtime only No recent weight change He is not doing much exercise or walking because of fatigue  No hypoglycemia except rarely low normal sugars mid afternoon     Side effects from medications: None         CGM device freestyle libre version 2  Interpretation of the current 2-week download as follows  Summary of patterns: He has tendency to hyperglycemia after breakfast but also blood sugars tend to be gradually increasing between late afternoon and 10 PM  OVERNIGHT blood sugars are modestly increased around 170 at  midnight and then gradually decreasing until breakfast although slightly better in the last 2 days  Premeal blood sugars are in to be high at lunch and dinner especially before dinner  POSTPRANDIAL readings are the highest after dinner but rising less compared to Premeal reading Has more of a rise after breakfast with blood sugars periodically over 200 No hypoglycemia with only rarely low normal readings mid afternoon   CGM use % of time   2-week average/GV 160/24  Time in range     68   %  % Time Above 180 30+2  % Time above 250   % Time Below 70      PRE-MEAL Fasting Lunch Dinner Bedtime Overall  Glucose range:       Averages: 148  167 173    POST-MEAL PC Breakfast PC Lunch PC Dinner  Glucose range:     Averages: 184 148 193   Previously:  CGM use % of time 92  2-week average/GV 148, GMI 6.9  Time in range     81   %  % Time Above 180 19  % Time above 250   % Time Below 70      PRE-MEAL Fasting Lunch Dinner Bedtime Overall  Glucose range:       Averages: 120 153 135     POST-MEAL PC Breakfast PC Lunch PC Dinner  Glucose range:  Averages: 181 153 169      Meals: 2-3  meals per day, usually some form of bread and milk in the morning, relatively smaller lunch, sometimes having eggs at lunchtime., sometimes sweets in pm, fried food occasionally        Dinner usually 5-6 PM         Dietician visit: Most recent: None             Wt Readings from Last 3 Encounters:  12/25/21 185 lb (83.9 kg)  10/07/21 184 lb 6.4 oz (83.6 kg)  07/10/21 186 lb 9.6 oz (84.6 kg)   LABS:  Lab Results  Component Value Date   HGBA1C 7.7 (H) 12/23/2021   HGBA1C 7.4 (H) 06/26/2021   HGBA1C 6.7 (H) 02/27/2021   Lab Results  Component Value Date   MICROALBUR 1.0 06/26/2021   LDLCALC 105 (H) 12/23/2021   CREATININE 0.96 12/23/2021    Other active problems: See review of systems   Allergies as of 12/25/2021   No Known Allergies      Medication List        Accurate as of  December 25, 2021 12:07 PM. If you have any questions, ask your nurse or doctor.          Alcohol Swabs 70 % Pads Use as directed   amLODipine 5 MG tablet Commonly known as: NORVASC TAKE 1 TABLET BY MOUTH EVERY DAY AT 10PM   aspirin 81 MG chewable tablet Chew 1 tablet (81 mg total) by mouth daily.   atorvastatin 40 MG tablet Commonly known as: LIPITOR Take 1 tablet (40 mg total) by mouth at bedtime.   carvedilol 12.5 MG tablet Commonly known as: COREG TAKE 1 TABLET BY MOUTH 2 TIMES DAILY.   Contour Next Test test strip Generic drug: glucose blood USE TO CHECK BLOOD SUGAR TWICE A DAY   empagliflozin 25 MG Tabs tablet Commonly known as: Jardiance Take 1 tablet (25 mg total) by mouth daily before breakfast.   FreeStyle Libre 14 Day Sensor Misc USE EVERY 14 DAYS AS DIRECTED  DX E11.65   gabapentin 300 MG capsule Commonly known as: NEURONTIN TAKE 1 CAPSULE BY MOUTH THREE TIMES A DAY   INSULIN SYRINGE 1CC/31GX5/16" 31G X 5/16" 1 ML Misc Use to inject lantus daily   metFORMIN 500 MG 24 hr tablet Commonly known as: GLUCOPHAGE-XR TAKE 1 TABLET BY MOUTH IN THE MORNING AND 2 TABLETS IN THE EVENING   Microlet Lancets Misc Use to check blood sugar 2 times a day   NovoLOG FlexPen 100 UNIT/ML FlexPen Generic drug: insulin aspart Use up to 25 units before breakfast and dinner What changed:  how much to take how to take this when to take this   olmesartan 40 MG tablet Commonly known as: BENICAR TAKE 1 TABLET BY MOUTH EVERY DAY   pantoprazole 40 MG tablet Commonly known as: PROTONIX Take 40 mg by mouth daily.   spironolactone 25 MG tablet Commonly known as: ALDACTONE Take 25 mg by mouth daily.   Tyler Aas FlexTouch 200 UNIT/ML FlexTouch Pen Generic drug: insulin degludec INJECT 24 UNITS INTO THE SKIN AT BEDTIME What changed: how much to take        Allergies: No Known Allergies  Past Medical History:  Diagnosis Date   Diabetes mellitus    H/O epistaxis     History of stomach ulcers    30 YRS AGO   Hypercholesteremia    Hypertension    Shoulder pain, left  Umbilical hernia     Past Surgical History:  Procedure Laterality Date   CARDIAC CATHETERIZATION  1997   COLONOSCOPY     UMBILICAL HERNIA REPAIR N/A 07/11/2015   Procedure: LAPAROSCOPIC ASSISTED OPEN UMBILICAL HERNIA;  Surgeon: Johnathan Hausen, MD;  Location: [facility];  Service: General;  Laterality: N/A;  With MESH    Family History  Problem Relation Age of Onset   Heart disease Mother    Diabetes Father    Diabetes Sister    No colon cancer  Social History:  reports that he quit smoking about 24 years ago. His smoking use included cigarettes. He has a 10.00 pack-year smoking history. He has never used smokeless tobacco. He reports that he does not drink alcohol and does not use drugs.  Review of Systems:   HYPERTENSION:  he has had long-standing hypertension Has been prescribed carvedilol, diltiazem 240 and Benicar 40 mg Also on Jardiance  BP Readings from Last 3 Encounters:  12/25/21 118/68  10/07/21 117/60  07/10/21 (!) 148/79   Lab Results  Component Value Date   CREATININE 0.96 12/23/2021   BUN 14 12/23/2021   NA 137 12/23/2021   K 4.7 12/23/2021   CL 101 12/23/2021   CO2 27 12/23/2021     HYPERLIPIDEMIA: The lipid abnormality consists of elevated LDL.    He  is prescribed generic Lipitor 40 mg daily He was tried on a lower dose by the cardiologist because of muscle aches but is back on 40 mg for the last week LDL still high  Has had some evidence of CAD in the past but asymptomatic    Lab Results  Component Value Date   CHOL 176 12/23/2021   CHOL 173 10/09/2021   CHOL 143 02/27/2021   Lab Results  Component Value Date   HDL 51.60 12/23/2021   HDL 49 10/09/2021   HDL 47.90 02/27/2021   Lab Results  Component Value Date   LDLCALC 105 (H) 12/23/2021   LDLCALC 102 (H) 10/09/2021   LDLCALC 76 02/27/2021   Lab Results  Component Value Date    TRIG 94.0 12/23/2021   TRIG 121 10/09/2021   TRIG 95.0 02/27/2021   Lab Results  Component Value Date   CHOLHDL 3 12/23/2021   CHOLHDL 3 02/27/2021   CHOLHDL 3 08/20/2020   Lab Results  Component Value Date   LDLDIRECT 109 (H) 10/09/2021   LDLDIRECT 89.8 12/29/2013   MUSCLE aches: Continues to have muscle aches in his upper arms mostly but he thinks this is more when he is trying to rest at night Has not discussed with PCP  He has had burning and pains in his lower legs and feet currently less symptomatic and not treated Apparently cannot tolerate more than 200 mg of gabapentin and lower doses do not control his symptoms      Examination:   BP 118/68 (BP Location: Right Arm, Patient Position: Sitting, Cuff Size: Normal)   Ht '5\' 3"'$  (1.6 m)   Wt 185 lb (83.9 kg)   BMI 32.77 kg/m   Body mass index is 32.77 kg/m.    ASSESSMENT/ PLAN:   Diabetes type 2 insulin-dependent  See history of present illness for detailed discussion of his current management, blood sugar patterns and problems identified  His A1c is higher at 7.7  He is on basal bolus insulin, Jardiance and metformin  Overall not well controlled with only 68% within target range Appears to be needing more insulin now Also not getting balanced  meals as discussed above He is asking about taking Ozempic and agreed that this may be an option especially if it allows him better control, weight loss and lowered insulin doses However cost may be an issue   HYPERTENSION: Blood pressure is better controlled, needs to stay on Aldactone   RECOMMENDATIONS for diabetes: Try to add protein consistently at breakfast Increase Tresiba by 2 units for now Also needs 2 to 4 units at breakfast unless getting more protein Start walking for exercise Continue Jardiance 25 mg daily Trial of Ozempic 0.25 mg weekly with a sample Explained to the patient what GLP-1 drugs are, the sites of actions and the body, reduction of hunger  sensation and improved insulin secretion.  Discussed the benefit of weight loss with these medications. Explained possible side effects of OZEMPIC, most commonly nausea that usually improves over time.  Also explained safety information associated with the medication Demonstrated the medication injection device and injection technique to the patient.  To start the injections with the 0.25 mg dosage weekly for the first 4 injections and then go up to 0.5 mg weekly if no continued nausea  Patient brochure on Ozempic with enclosed 30-day trial card given   For his upper extremity muscle aches at night he needs to discuss further with his PCP  Lipids: Since he started back on Lipitor 40 mg only recently will wait till next visit to adjust the dose or change it  There are no Patient Instructions on file for this visit.     Elayne Snare 12/25/2021, 12:07 PM   Note: This office note was prepared with Dragon voice recognition system technology. Any transcriptional errors that result from this process are unintentional.

## 2021-12-26 ENCOUNTER — Other Ambulatory Visit: Payer: Self-pay | Admitting: Endocrinology

## 2021-12-26 ENCOUNTER — Other Ambulatory Visit: Payer: Self-pay | Admitting: Cardiology

## 2021-12-26 DIAGNOSIS — E782 Mixed hyperlipidemia: Secondary | ICD-10-CM

## 2021-12-26 DIAGNOSIS — I10 Essential (primary) hypertension: Secondary | ICD-10-CM | POA: Diagnosis not present

## 2021-12-26 DIAGNOSIS — E1165 Type 2 diabetes mellitus with hyperglycemia: Secondary | ICD-10-CM

## 2021-12-26 DIAGNOSIS — E1169 Type 2 diabetes mellitus with other specified complication: Secondary | ICD-10-CM | POA: Diagnosis not present

## 2021-12-26 DIAGNOSIS — Z125 Encounter for screening for malignant neoplasm of prostate: Secondary | ICD-10-CM | POA: Diagnosis not present

## 2021-12-27 ENCOUNTER — Encounter: Payer: Self-pay | Admitting: Endocrinology

## 2021-12-31 ENCOUNTER — Ambulatory Visit
Admission: RE | Admit: 2021-12-31 | Discharge: 2021-12-31 | Disposition: A | Discharge status: Home / Self Care | Payer: Medicare HMO | Source: Ambulatory Visit | Attending: Internal Medicine | Admitting: Internal Medicine

## 2021-12-31 ENCOUNTER — Other Ambulatory Visit: Payer: Self-pay | Admitting: Internal Medicine

## 2021-12-31 DIAGNOSIS — M79601 Pain in right arm: Secondary | ICD-10-CM

## 2021-12-31 DIAGNOSIS — R222 Localized swelling, mass and lump, trunk: Secondary | ICD-10-CM | POA: Diagnosis not present

## 2021-12-31 DIAGNOSIS — M5031 Other cervical disc degeneration,  high cervical region: Secondary | ICD-10-CM | POA: Diagnosis not present

## 2021-12-31 DIAGNOSIS — G4733 Obstructive sleep apnea (adult) (pediatric): Secondary | ICD-10-CM | POA: Diagnosis not present

## 2021-12-31 DIAGNOSIS — K2950 Unspecified chronic gastritis without bleeding: Secondary | ICD-10-CM | POA: Diagnosis not present

## 2021-12-31 DIAGNOSIS — M25511 Pain in right shoulder: Secondary | ICD-10-CM | POA: Diagnosis not present

## 2021-12-31 DIAGNOSIS — M50323 Other cervical disc degeneration at C6-C7 level: Secondary | ICD-10-CM | POA: Diagnosis not present

## 2022-01-08 ENCOUNTER — Ambulatory Visit
Admission: RE | Admit: 2022-01-08 | Discharge: 2022-01-08 | Disposition: A | Discharge status: Home / Self Care | Payer: Medicare HMO | Source: Ambulatory Visit | Attending: Physical Medicine and Rehabilitation | Admitting: Physical Medicine and Rehabilitation

## 2022-01-08 ENCOUNTER — Other Ambulatory Visit: Payer: Self-pay | Admitting: Physical Medicine and Rehabilitation

## 2022-01-08 DIAGNOSIS — M79601 Pain in right arm: Secondary | ICD-10-CM

## 2022-01-08 DIAGNOSIS — M542 Cervicalgia: Secondary | ICD-10-CM | POA: Diagnosis not present

## 2022-01-13 DIAGNOSIS — M79601 Pain in right arm: Secondary | ICD-10-CM | POA: Diagnosis not present

## 2022-01-14 DIAGNOSIS — M79601 Pain in right arm: Secondary | ICD-10-CM | POA: Diagnosis not present

## 2022-01-14 DIAGNOSIS — R040 Epistaxis: Secondary | ICD-10-CM | POA: Diagnosis not present

## 2022-01-17 ENCOUNTER — Other Ambulatory Visit: Payer: Self-pay | Admitting: Endocrinology

## 2022-01-17 DIAGNOSIS — E1165 Type 2 diabetes mellitus with hyperglycemia: Secondary | ICD-10-CM

## 2022-01-24 ENCOUNTER — Other Ambulatory Visit: Payer: Self-pay | Admitting: Endocrinology

## 2022-02-05 DIAGNOSIS — M79601 Pain in right arm: Secondary | ICD-10-CM | POA: Diagnosis not present

## 2022-02-05 DIAGNOSIS — M79603 Pain in arm, unspecified: Secondary | ICD-10-CM | POA: Diagnosis not present

## 2022-02-24 DIAGNOSIS — M79603 Pain in arm, unspecified: Secondary | ICD-10-CM | POA: Diagnosis not present

## 2022-02-24 DIAGNOSIS — M79601 Pain in right arm: Secondary | ICD-10-CM | POA: Diagnosis not present

## 2022-02-26 ENCOUNTER — Other Ambulatory Visit: Payer: Self-pay | Admitting: Physical Medicine and Rehabilitation

## 2022-02-26 DIAGNOSIS — M79603 Pain in arm, unspecified: Secondary | ICD-10-CM

## 2022-02-27 ENCOUNTER — Other Ambulatory Visit: Payer: Self-pay | Admitting: Cardiology

## 2022-02-27 DIAGNOSIS — E782 Mixed hyperlipidemia: Secondary | ICD-10-CM

## 2022-02-27 DIAGNOSIS — E1165 Type 2 diabetes mellitus with hyperglycemia: Secondary | ICD-10-CM

## 2022-03-02 ENCOUNTER — Other Ambulatory Visit: Payer: Self-pay | Admitting: Cardiology

## 2022-03-02 DIAGNOSIS — I10 Essential (primary) hypertension: Secondary | ICD-10-CM

## 2022-03-04 DIAGNOSIS — R040 Epistaxis: Secondary | ICD-10-CM | POA: Diagnosis not present

## 2022-03-05 ENCOUNTER — Telehealth: Payer: Self-pay

## 2022-03-05 NOTE — Telephone Encounter (Signed)
Patient called states that he needs refills but did not specify on which ones he needed. I tried calling patient back but no answer.

## 2022-03-18 ENCOUNTER — Other Ambulatory Visit: Payer: Self-pay

## 2022-03-18 DIAGNOSIS — E1165 Type 2 diabetes mellitus with hyperglycemia: Secondary | ICD-10-CM

## 2022-03-18 MED ORDER — TRESIBA FLEXTOUCH 200 UNIT/ML ~~LOC~~ SOPN: 24.0000 [IU] | PEN_INJECTOR | Freq: Every day | SUBCUTANEOUS | 2 refills | Status: DC

## 2022-03-18 MED ORDER — METFORMIN HCL ER 500 MG PO TB24: ORAL_TABLET | ORAL | 2 refills | Status: DC

## 2022-03-18 MED ORDER — EMPAGLIFLOZIN 25 MG PO TABS: 25.0000 mg | ORAL_TABLET | Freq: Every day | ORAL | 2 refills | Status: DC

## 2022-03-18 MED ORDER — NOVOLOG FLEXPEN 100 UNIT/ML ~~LOC~~ SOPN: PEN_INJECTOR | SUBCUTANEOUS | 1 refills | Status: DC

## 2022-03-18 NOTE — Addendum Note (Signed)
Addended by: Lauralyn Primes on: 03/18/2022 02:29 PM   Modules accepted: Orders

## 2022-03-19 MED ORDER — TRESIBA FLEXTOUCH 200 UNIT/ML ~~LOC~~ SOPN: 30.0000 [IU] | PEN_INJECTOR | Freq: Every day | SUBCUTANEOUS | 2 refills | Status: DC

## 2022-03-19 NOTE — Addendum Note (Signed)
Addended by: Lauralyn Primes on: 03/19/2022 10:50 AM   Modules accepted: Orders

## 2022-03-25 ENCOUNTER — Other Ambulatory Visit: Payer: Medicare HMO

## 2022-03-26 ENCOUNTER — Other Ambulatory Visit: Payer: Self-pay | Admitting: Endocrinology

## 2022-03-26 DIAGNOSIS — E1165 Type 2 diabetes mellitus with hyperglycemia: Secondary | ICD-10-CM

## 2022-03-27 ENCOUNTER — Other Ambulatory Visit: Payer: Self-pay

## 2022-03-27 DIAGNOSIS — I10 Essential (primary) hypertension: Secondary | ICD-10-CM

## 2022-03-27 DIAGNOSIS — E1165 Type 2 diabetes mellitus with hyperglycemia: Secondary | ICD-10-CM

## 2022-03-27 DIAGNOSIS — E782 Mixed hyperlipidemia: Secondary | ICD-10-CM

## 2022-03-27 MED ORDER — CARVEDILOL 12.5 MG PO TABS: 12.5000 mg | ORAL_TABLET | Freq: Two times a day (BID) | ORAL | 0 refills | Status: DC

## 2022-03-27 MED ORDER — TRESIBA FLEXTOUCH 200 UNIT/ML ~~LOC~~ SOPN: 30.0000 [IU] | PEN_INJECTOR | Freq: Every day | SUBCUTANEOUS | 2 refills | Status: DC

## 2022-03-27 MED ORDER — AMLODIPINE BESYLATE 5 MG PO TABS: ORAL_TABLET | ORAL | 0 refills | Status: DC

## 2022-03-27 MED ORDER — ATORVASTATIN CALCIUM 40 MG PO TABS: ORAL_TABLET | ORAL | 0 refills | Status: DC

## 2022-03-27 MED ORDER — AMLODIPINE BESYLATE 5 MG PO TABS: ORAL_TABLET | ORAL | 1 refills | Status: DC

## 2022-04-02 ENCOUNTER — Ambulatory Visit
Admission: RE | Admit: 2022-04-02 | Discharge: 2022-04-02 | Disposition: A | Discharge status: Home / Self Care | Payer: Medicare HMO | Source: Ambulatory Visit | Attending: Physical Medicine and Rehabilitation | Admitting: Physical Medicine and Rehabilitation

## 2022-04-02 DIAGNOSIS — M79603 Pain in arm, unspecified: Secondary | ICD-10-CM

## 2022-04-02 DIAGNOSIS — M19011 Primary osteoarthritis, right shoulder: Secondary | ICD-10-CM | POA: Diagnosis not present

## 2022-04-02 DIAGNOSIS — M79601 Pain in right arm: Secondary | ICD-10-CM | POA: Diagnosis not present

## 2022-04-08 ENCOUNTER — Encounter: Payer: Self-pay | Admitting: Cardiology

## 2022-04-08 ENCOUNTER — Ambulatory Visit: Payer: Medicare HMO | Admitting: Cardiology

## 2022-04-08 VITALS — BP 133/73 | HR 78 | Resp 16 | Ht 63.0 in | Wt 185.8 lb

## 2022-04-08 DIAGNOSIS — E1165 Type 2 diabetes mellitus with hyperglycemia: Secondary | ICD-10-CM | POA: Diagnosis not present

## 2022-04-08 DIAGNOSIS — E6609 Other obesity due to excess calories: Secondary | ICD-10-CM | POA: Diagnosis not present

## 2022-04-08 DIAGNOSIS — I10 Essential (primary) hypertension: Secondary | ICD-10-CM

## 2022-04-08 DIAGNOSIS — Z87891 Personal history of nicotine dependence: Secondary | ICD-10-CM

## 2022-04-08 DIAGNOSIS — E782 Mixed hyperlipidemia: Secondary | ICD-10-CM | POA: Diagnosis not present

## 2022-04-08 DIAGNOSIS — Z794 Long term (current) use of insulin: Secondary | ICD-10-CM | POA: Diagnosis not present

## 2022-04-08 DIAGNOSIS — Z6832 Body mass index (BMI) 32.0-32.9, adult: Secondary | ICD-10-CM | POA: Diagnosis not present

## 2022-04-08 MED ORDER — AMLODIPINE-OLMESARTAN 5-40 MG PO TABS: 1.0000 | ORAL_TABLET | Freq: Every day | ORAL | 0 refills | Status: DC

## 2022-04-08 NOTE — Progress Notes (Signed)
ID:  Austin Norris, DOB 03-06-1956, MRN [id number]  PCP:  Leeroy Cha, MD  Cardiologist:  Rex Kras, DO, [facility] (established care 01/15/21)  Date: 04/08/22 Last Office Visit: 10/07/2021  Chief Complaint  Patient presents with   Hypertension   Hyperlipidemia   Follow-up    6 month    HPI  Austin Norris is a 66 y.o. [place] male whose past medical history and cardiovascular risk factors include: Benign essential hypertension, hyperlipidemia, insulin-dependent diabetes mellitus type 2, BPH, gastroparesis, former smoker, obesity due to excess calories.  Patient was referred to the practice for evaluation hypertension.  With up titration of medical therapy patient's blood pressures have improved.  In the past he did undergo stress test which noted low risk study with diaphragmatic attenuation but mild inferoseptal ischemia cannot be entirely ruled out.  However, since he is remained asymptomatic no additional workup was performed.  He presents today for 28-monthfollow-up visit. Patient is doing well from a cardiovascular standpoint.  Denies anginal discomfort or heart failure symptoms.  Home blood pressures are very well-controlled.  He is tolerating the Lipitor 40 mg p.o. nightly without any side effects or intolerances.  Unfortunately, patient is not exercising as originally planned but going to the [place]  He was started on Ozempic for weight loss/glycemic control.  No significant improvement overall weight but sugars are better controlled.  He is requesting if some of her medications can be reduced or combined.  FUNCTIONAL STATUS: No structured exercise program or daily routine.   ALLERGIES: No Known Allergies  MEDICATION LIST PRIOR TO VISIT: Current Meds  Medication Sig   Alcohol Swabs 70 % PADS Use as directed   amLODipine-olmesartan (AZOR) 5-40 MG tablet Take 1 tablet by mouth daily at 10 pm.   aspirin 81 MG chewable tablet Chew 1 tablet (81 mg total) by  mouth daily.   atorvastatin (LIPITOR) 40 MG tablet TAKE 1 TABLET BY MOUTH EVERYDAY AT BEDTIME   carvedilol (COREG) 12.5 MG tablet Take 1 tablet (12.5 mg total) by mouth 2 (two) times daily.   Continuous Blood Gluc Sensor (FREESTYLE LIBRE 14 DAY SENSOR) MISC USE EVERY 14 DAYS AS DIRECTED  DX E11.65   empagliflozin (JARDIANCE) 25 MG TABS tablet Take 1 tablet (25 mg total) by mouth daily before breakfast.   gabapentin (NEURONTIN) 300 MG capsule TAKE 1 CAPSULE BY MOUTH THREE TIMES A DAY (Patient taking differently: Pt takes PRN)   glucose blood (CONTOUR NEXT TEST) test strip USE TO CHECK BLOOD SUGAR TWICE A DAY   insulin aspart (NOVOLOG FLEXPEN) 100 UNIT/ML FlexPen INJECT 35 UNITS INTO THE SKIN IN THE MORNING, AT NOON, AND AT BEDTIME.   insulin degludec (TRESIBA FLEXTOUCH) 200 UNIT/ML FlexTouch Pen Inject 30 Units into the skin at bedtime.   Insulin Syringe-Needle U-100 (INSULIN SYRINGE 1CC/31GX5/16") 31G X 5/16" 1 ML MISC Use to inject lantus daily   metFORMIN (GLUCOPHAGE-XR) 500 MG 24 hr tablet TAKE 1 TABLET BY MOUTH IN THE MORNING AND 2 TABLETS IN THE EVENING   MICROLET LANCETS MISC Use to check blood sugar 2 times a day   spironolactone (ALDACTONE) 25 MG tablet Take 25 mg by mouth daily.   [DISCONTINUED] amLODipine (NORVASC) 5 MG tablet TAKE 1 TABLET BY MOUTH EVERY DAY AT 10PM   [DISCONTINUED] olmesartan (BENICAR) 40 MG tablet TAKE 1 TABLET BY MOUTH EVERY DAY     PAST MEDICAL HISTORY: Past Medical History:  Diagnosis Date   Diabetes mellitus    H/O epistaxis  History of stomach ulcers    30 YRS AGO   Hypercholesteremia    Hypertension    Shoulder pain, left    Umbilical hernia     PAST SURGICAL HISTORY: Past Surgical History:  Procedure Laterality Date   CARDIAC CATHETERIZATION  1997   COLONOSCOPY     UMBILICAL HERNIA REPAIR N/A 07/11/2015   Procedure: LAPAROSCOPIC ASSISTED OPEN UMBILICAL HERNIA;  Surgeon: Johnathan Hausen, MD;  Location: [facility];  Service: General;  Laterality:  N/A;  With MESH    FAMILY HISTORY: The patient family history includes Diabetes in his father and sister; Heart disease in his mother.  SOCIAL HISTORY:  The patient  reports that he quit smoking about 24 years ago. His smoking use included cigarettes. He has a 10.00 pack-year smoking history. He has never used smokeless tobacco. He reports that he does not drink alcohol and does not use drugs.  REVIEW OF SYSTEMS: Review of Systems  Cardiovascular:  Negative for chest pain, claudication, dyspnea on exertion, leg swelling, orthopnea, palpitations, paroxysmal nocturnal dyspnea and syncope.    PHYSICAL EXAM:    04/08/2022    1:08 PM 12/25/2021   12:04 PM 10/07/2021   10:22 AM  Vitals with BMI  Height _0  _1  _2   Weight 185 lbs 13 oz 185 lbs 184 lbs 6 oz  BMI 32.92 81.82 99.37  Systolic 169 678 938  Diastolic 73 68 60  Pulse 78 55 66   Physical Exam  Constitutional: No distress.  Age appropriate, hemodynamically stable.   Neck: No JVD present.  Cardiovascular: Normal rate, regular rhythm, S1 normal, S2 normal, intact distal pulses and normal pulses. Exam reveals no gallop, no S3 and no S4.  No murmur heard. Pulmonary/Chest: Effort normal and breath sounds normal. No stridor. He has no wheezes. He has no rales.  Abdominal: Soft. Bowel sounds are normal. He exhibits no distension. There is no abdominal tenderness.  Musculoskeletal:        General: No edema.     Cervical back: Neck supple.  Neurological: He is alert and oriented to person, place, and time. He has intact cranial nerves (2-12).  Skin: Skin is warm and moist.   CARDIAC DATABASE: EKG: 04/08/2022: Sinus bradycardia, 59 bpm, without underlying ischemia injury pattern.  Echocardiogram: 02/12/2021:  Normal LV systolic function with visual EF 60-65%. Left ventricle cavity is normal in size. Mild left ventricular hypertrophy. Normal global wall motion. Indeterminate diastolic filling pattern, normal LAP.  Left  atrial cavity is mildly dilated.  Trace tricuspid regurgitation. No evidence of pulmonary hypertension.  Mild pulmonic regurgitation.  No prior study for comparison.   Stress Testing: Exercise nuclear stress test 02/18/2021:  Normal ECG stress.  Exercised for 5 minutes and 0 seconds of a Bruce protocol, achieving approximately 7.05 METs.  The baseline blood pressure was 148/80 mmHg and increased to 230/80 mmHg, which is a hypertensive response to exercise. Myocardial perfusion is abnormal. There is a mild degree small perfusion defect noted in the inferior wall consistent with diaphragmatic attenuation.  Mild inferoseptal ischemia in this region cannot be completely excluded. Gated SPECT imaging of the left ventricle was abnormal with calculated LVEF mildly reduced at 43%.   However visually wall motion appears to be normal.  There was no stress lung uptake and  TID was normal at 0.93. Low risk study. No previous exam available for comparison.  Heart Catheterization: None   Renal artery duplex 07/25/2021: No evidence of renal artery occlusive disease in either  renal artery. Normal intrarenal vascular perfusion is noted in both kidneys. Normal sized kidneys bilateral.  Diffuse aortic atherosclerosis.  LABORATORY DATA:    Latest Ref Rng & Units 03/22/2020    2:46 AM 03/20/2020    8:50 PM 03/04/2019    8:54 PM  CBC  WBC 4.0 - 10.5 K/uL 8.2  9.9  5.8   Hemoglobin 13.0 - 17.0 g/dL 14.9  16.1  15.5   Hematocrit 39.0 - 52.0 % 45.8  48.4  48.2   Platelets 150 - 400 K/uL 435  440  237        Latest Ref Rng & Units 12/23/2021   11:26 AM 10/09/2021   10:38 AM 06/26/2021    3:16 PM  CMP  Glucose 70 - 99 mg/dL 100  157  75   BUN 6 - 23 mg/dL _0 Creatinine 0.40 - 1.50 mg/dL 0.96  0.85  0.89   Sodium 135 - 145 mEq/L 137  136  136   Potassium 3.5 - 5.1 mEq/L 4.7  4.8  4.4   Chloride 96 - 112 mEq/L 101  99  102   CO2 19 - 32 mEq/L _1 Calcium 8.4 - 10.5 mg/dL 9.6  9.1   9.4   Total Protein 6.0 - 8.3 g/dL 7.4  6.5    Total Bilirubin 0.2 - 1.2 mg/dL 0.4  0.4    Alkaline Phos 39 - 117 U/L 86  93    AST 0 - 37 U/L 19  18    ALT 0 - 53 U/L 16  15      Lipid Panel  Lab Results  Component Value Date   CHOL 176 12/23/2021   HDL 51.60 12/23/2021   LDLCALC 105 (H) 12/23/2021   LDLDIRECT 109 (H) 10/09/2021   TRIG 94.0 12/23/2021   CHOLHDL 3 12/23/2021     No components found for: "NTPROBNP" No results for input(s): "PROBNP" in the last 8760 hours. No results for input(s): "TSH" in the last 8760 hours.   BMP Recent Labs    06/26/21 1516 10/09/21 1038 12/23/21 1126  NA 136 136 137  K 4.4 4.8 4.7  CL 102 99 101  CO2 _2 GLUCOSE 75 157* 100*  BUN _3 CREATININE 0.89 0.85 0.96  CALCIUM 9.4 9.1 9.6    HEMOGLOBIN A1C Lab Results  Component Value Date   HGBA1C 7.7 (H) 12/23/2021   MPG 162.81 03/23/2020    IMPRESSION:    ICD-10-CM   1. Benign hypertension  I10 amLODipine-olmesartan (AZOR) 5-40 MG tablet    2. Mixed hyperlipidemia  E78.2 EKG 12-Lead    Lipid Panel With LDL/HDL Ratio    LDL cholesterol, direct    CMP14+EGFR    3. Type 2 diabetes mellitus with hyperglycemia, with long-term current use of insulin (HCC)  E11.65 Lipid Panel With LDL/HDL Ratio   Z79.4 LDL cholesterol, direct    CMP14+EGFR    4. Former smoker  Z87.891     5. Class 1 obesity due to excess calories with serious comorbidity and body mass index (BMI) of 32.0 to 32.9 in adult  E66.09    Z68.32         RECOMMENDATIONS: Nil Austin Norris is a 66 y.o. male whose past medical history and cardiac risk factors include: Benign essential hypertension, hyperlipidemia, insulin-dependent diabetes mellitus type 2, BPH, gastroparesis, former smoker, obesity due to excess calories.  Benign hypertension  Referred to the practice for management of benign essential hypertension. He responded well to medical therapy. He is requesting combination medications to  reduce the number of daily pills. Discontinue amlodipine and olmesartan.  Will transition him to [employer].  Mixed hyperlipidemia Check fasting lipid profile, direct LDL, and CMP. Continue atorvastatin 40 mg p.o. nightly  Type 2 diabetes mellitus with hyperglycemia, with long-term current use of insulin (HCC) Glycemic control has improved after the initiation of Ozempic. Currently on statin therapy, ARB, Jardiance IgM importance of glycemic control and moderate intensity exercise as tolerated with a goal of 30 minutes a day 5 days a week  Class 1 obesity due to excess calories with serious comorbidity and body mass index (BMI) of 32.0 to 32.9 in adult Body mass index is 32.91 kg/m. I reviewed with the patient the importance of diet, regular physical activity/exercise, weight loss.   Patient is educated on increasing physical activity gradually as tolerated.  With the goal of moderate intensity exercise for 30 minutes a day 5 days a week.  FINAL MEDICATION LIST END OF ENCOUNTER: Meds ordered this encounter  Medications   amLODipine-olmesartan (AZOR) 5-40 MG tablet    Sig: Take 1 tablet by mouth daily at 10 pm.    Dispense:  90 tablet    Refill:  0     Medications Discontinued During This Encounter  Medication Reason   pantoprazole (PROTONIX) 40 MG tablet Patient Preference   amLODipine (NORVASC) 5 MG tablet Change in therapy   olmesartan (BENICAR) 40 MG tablet Change in therapy     Current Outpatient Medications:    Alcohol Swabs 70 % PADS, Use as directed, Disp: 300 each, Rfl: 1   amLODipine-olmesartan (AZOR) 5-40 MG tablet, Take 1 tablet by mouth daily at 10 pm., Disp: 90 tablet, Rfl: 0   aspirin 81 MG chewable tablet, Chew 1 tablet (81 mg total) by mouth daily., Disp: 90 tablet, Rfl: 1   atorvastatin (LIPITOR) 40 MG tablet, TAKE 1 TABLET BY MOUTH EVERYDAY AT BEDTIME, Disp: 90 tablet, Rfl: 0   carvedilol (COREG) 12.5 MG tablet, Take 1 tablet (12.5 mg total) by mouth 2 (two) times  daily., Disp: 180 tablet, Rfl: 0   Continuous Blood Gluc Sensor (FREESTYLE LIBRE 14 DAY SENSOR) MISC, USE EVERY 14 DAYS AS DIRECTED  DX E11.65, Disp: 6 each, Rfl: 3   empagliflozin (JARDIANCE) 25 MG TABS tablet, Take 1 tablet (25 mg total) by mouth daily before breakfast., Disp: 90 tablet, Rfl: 2   gabapentin (NEURONTIN) 300 MG capsule, TAKE 1 CAPSULE BY MOUTH THREE TIMES A DAY (Patient taking differently: Pt takes PRN), Disp: 270 capsule, Rfl: 2   glucose blood (CONTOUR NEXT TEST) test strip, USE TO CHECK BLOOD SUGAR TWICE A DAY, Disp: 100 strip, Rfl: 2   insulin aspart (NOVOLOG FLEXPEN) 100 UNIT/ML FlexPen, INJECT 35 UNITS INTO THE SKIN IN THE MORNING, AT NOON, AND AT BEDTIME., Disp: 45 mL, Rfl: 1   insulin degludec (TRESIBA FLEXTOUCH) 200 UNIT/ML FlexTouch Pen, Inject 30 Units into the skin at bedtime., Disp: 18 mL, Rfl: 2   Insulin Syringe-Needle U-100 (INSULIN SYRINGE 1CC/31GX5/16") 31G X 5/16" 1 ML MISC, Use to inject lantus daily, Disp: 90 each, Rfl: 1   metFORMIN (GLUCOPHAGE-XR) 500 MG 24 hr tablet, TAKE 1 TABLET BY MOUTH IN THE MORNING AND 2 TABLETS IN THE EVENING, Disp: 270 tablet, Rfl: 1   MICROLET LANCETS MISC, Use to check blood sugar 2 times a day, Disp: 200 each, Rfl: 1   spironolactone (  ALDACTONE) 25 MG tablet, Take 25 mg by mouth daily., Disp: , Rfl:   Orders Placed This Encounter  Procedures   Lipid Panel With LDL/HDL Ratio   LDL cholesterol, direct   CMP14+EGFR   EKG 12-Lead    There are no Patient Instructions on file for this visit.   --Continue cardiac medications as reconciled in final medication list. --Return in about 1 year (around 04/09/2023) for Annual follow up visit , BP, HLD. Or sooner if needed. --Continue follow-up with your primary care physician regarding the management of your other chronic comorbid conditions.  Patient's questions and concerns were addressed to his satisfaction. He voices understanding of the instructions provided during this encounter.    This note was created using a voice recognition software as a result there may be grammatical errors inadvertently enclosed that do not reflect the nature of this encounter. Every attempt is made to correct such errors.   Rex Kras, [place], [facility]  Pager: [phone] Office: [phone]

## 2022-04-09 ENCOUNTER — Ambulatory Visit: Payer: Medicare HMO | Admitting: Cardiology

## 2022-04-09 DIAGNOSIS — E1165 Type 2 diabetes mellitus with hyperglycemia: Secondary | ICD-10-CM | POA: Diagnosis not present

## 2022-04-09 DIAGNOSIS — E782 Mixed hyperlipidemia: Secondary | ICD-10-CM | POA: Diagnosis not present

## 2022-04-09 DIAGNOSIS — Z794 Long term (current) use of insulin: Secondary | ICD-10-CM | POA: Diagnosis not present

## 2022-04-10 DIAGNOSIS — H2512 Age-related nuclear cataract, left eye: Secondary | ICD-10-CM | POA: Diagnosis not present

## 2022-04-10 DIAGNOSIS — E119 Type 2 diabetes mellitus without complications: Secondary | ICD-10-CM | POA: Diagnosis not present

## 2022-04-10 DIAGNOSIS — H401231 Low-tension glaucoma, bilateral, mild stage: Secondary | ICD-10-CM | POA: Diagnosis not present

## 2022-04-10 LAB — CMP14+EGFR
ALT: 16 IU/L (ref 0–44)
AST: 16 IU/L (ref 0–40)
Albumin/Globulin Ratio: 2 (ref 1.2–2.2)
Albumin: 4.5 g/dL (ref 3.9–4.9)
Alkaline Phosphatase: 98 IU/L (ref 44–121)
BUN/Creatinine Ratio: 14 (ref 10–24)
BUN: 14 mg/dL (ref 8–27)
Bilirubin Total: 0.2 mg/dL (ref 0.0–1.2)
CO2: 21 mmol/L (ref 20–29)
Calcium: 9.7 mg/dL (ref 8.6–10.2)
Chloride: 101 mmol/L (ref 96–106)
Creatinine, Ser: 0.99 mg/dL (ref 0.76–1.27)
Globulin, Total: 2.3 g/dL (ref 1.5–4.5)
Glucose: 147 mg/dL — ABNORMAL HIGH (ref 70–99)
Potassium: 4.8 mmol/L (ref 3.5–5.2)
Sodium: 139 mmol/L (ref 134–144)
Total Protein: 6.8 g/dL (ref 6.0–8.5)
eGFR: 83 mL/min/{1.73_m2} (ref >59)

## 2022-04-10 LAB — LIPID PANEL WITH LDL/HDL RATIO
Cholesterol, Total: 151 mg/dL (ref 100–199)
HDL: 43 mg/dL (ref >39)
LDL Chol Calc (NIH): 84 mg/dL (ref 0–99)
LDL/HDL Ratio: 2 ratio (ref 0.0–3.6)
Triglycerides: 133 mg/dL (ref 0–149)
VLDL Cholesterol Cal: 24 mg/dL (ref 5–40)

## 2022-04-10 LAB — LDL CHOLESTEROL, DIRECT: LDL Direct: 85 mg/dL (ref 0–99)

## 2022-04-16 DIAGNOSIS — M25511 Pain in right shoulder: Secondary | ICD-10-CM | POA: Diagnosis not present

## 2022-04-17 ENCOUNTER — Other Ambulatory Visit: Payer: Medicare HMO

## 2022-04-18 ENCOUNTER — Telehealth: Payer: Self-pay

## 2022-04-18 ENCOUNTER — Other Ambulatory Visit: Payer: Medicare HMO

## 2022-04-18 DIAGNOSIS — Z794 Long term (current) use of insulin: Secondary | ICD-10-CM | POA: Diagnosis not present

## 2022-04-18 DIAGNOSIS — E7800 Pure hypercholesterolemia, unspecified: Secondary | ICD-10-CM | POA: Diagnosis not present

## 2022-04-18 DIAGNOSIS — E1165 Type 2 diabetes mellitus with hyperglycemia: Secondary | ICD-10-CM | POA: Diagnosis not present

## 2022-04-18 NOTE — Telephone Encounter (Signed)
Patient called and was given lab results

## 2022-04-19 LAB — COMPREHENSIVE METABOLIC PANEL
AG Ratio: 1.8 (calc) (ref 1.0–2.5)
ALT: 14 U/L (ref 9–46)
AST: 18 U/L (ref 10–35)
Albumin: 4.4 g/dL (ref 3.6–5.1)
Alkaline phosphatase (APISO): 86 U/L (ref 35–144)
BUN: 23 mg/dL (ref 7–25)
CO2: 24 mmol/L (ref 20–32)
Calcium: 9.7 mg/dL (ref 8.6–10.3)
Chloride: 98 mmol/L (ref 98–110)
Creat: 0.84 mg/dL (ref 0.70–1.35)
Globulin: 2.5 g/dL (calc) (ref 1.9–3.7)
Glucose, Bld: 214 mg/dL — ABNORMAL HIGH (ref 65–99)
Potassium: 4.7 mmol/L (ref 3.5–5.3)
Sodium: 134 mmol/L — ABNORMAL LOW (ref 135–146)
Total Bilirubin: 0.4 mg/dL (ref 0.2–1.2)
Total Protein: 6.9 g/dL (ref 6.1–8.1)

## 2022-04-19 LAB — HEMOGLOBIN A1C
Hgb A1c MFr Bld: 7.3 % of total Hgb — ABNORMAL HIGH (ref <5.7)
Mean Plasma Glucose: 163 mg/dL
eAG (mmol/L): 9 mmol/L

## 2022-04-19 LAB — LIPID PANEL
Cholesterol: 140 mg/dL (ref <200)
HDL: 45 mg/dL (ref >=40)
LDL Cholesterol (Calc): 65 mg/dL (calc)
Non-HDL Cholesterol (Calc): 95 mg/dL (calc) (ref <130)
Total CHOL/HDL Ratio: 3.1 (calc) (ref <5.0)
Triglycerides: 259 mg/dL — ABNORMAL HIGH (ref <150)

## 2022-04-19 LAB — MICROALBUMIN / CREATININE URINE RATIO
Creatinine, Urine: 41 mg/dL (ref 20–320)
Microalb, Ur: <0.2 mg/dL

## 2022-04-23 ENCOUNTER — Ambulatory Visit: Payer: Medicare HMO | Admitting: Endocrinology

## 2022-04-24 ENCOUNTER — Encounter: Payer: Self-pay | Admitting: Endocrinology

## 2022-04-24 ENCOUNTER — Ambulatory Visit: Payer: Medicare HMO | Admitting: Endocrinology

## 2022-04-24 VITALS — BP 118/78 | HR 70 | Ht 63.0 in | Wt 179.6 lb

## 2022-04-24 DIAGNOSIS — Z794 Long term (current) use of insulin: Secondary | ICD-10-CM | POA: Diagnosis not present

## 2022-04-24 DIAGNOSIS — I10 Essential (primary) hypertension: Secondary | ICD-10-CM | POA: Diagnosis not present

## 2022-04-24 DIAGNOSIS — E1165 Type 2 diabetes mellitus with hyperglycemia: Secondary | ICD-10-CM

## 2022-04-24 DIAGNOSIS — E7800 Pure hypercholesterolemia, unspecified: Secondary | ICD-10-CM

## 2022-04-24 MED ORDER — FREESTYLE LIBRE 3 SENSOR MISC: 2 refills | Status: DC

## 2022-04-24 MED ORDER — TRULICITY 0.75 MG/0.5ML ~~LOC~~ SOAJ: SUBCUTANEOUS | 0 refills | Status: DC

## 2022-04-24 NOTE — Progress Notes (Signed)
Patient ID: Austin Norris, male   DOB: Mar 12, 1956, 66 y.o.   MRN: [id number]   Reason for Appointment: Follow-up   History of Present Illness   Diagnosis: Type 2 DIABETES MELITUS, long-standing     He has been on insulin to treat his diabetes for several years partly because of failure of oral agents and also for reducing cost of medications He is generally checking his blood sugars once or twice a day but does not keep a record Overall has had somewhat poor control with A1c consistently over 7%  Recent history:    Insulin regimen: Tresiba 32 units at bedtime, Novolog usually 20-22 units before meals  Oral hypoglycemic drugs:, metformin ER 1 g daily, Jardiance 25 mg daily  A1c is 7.3 compared to 7 7  Current blood sugar patterns, management and problems identified:  He had wanted to try Ozempic on his last visit and this has improved his control He says that in the first week of trying to set 0.25 mg he was able to cut his NovoLog 50% However even with this taking this on and off for the last 3 to 4 months with the sample pen he has continued to have vomiting whenever he takes it He did not ask for a prescription Appears to have lost 6 pounds also He says that his portions are better when he takes Ozempic  Last visit: Was told to increase Tresiba by 2 units and overnight readings are good     Side effects from medications: None         CGM device freestyle libre version 2  Interpretation of the recent 2-week download as follows  Summary of patterns: He has fairly even average blood sugar throughout the day and night with average between about 130-150  Overnight blood sugars are steady in the 130 range average without hypoglycemia except once around midnight  Premeal blood sugars are steady at about 130-140 average at all times He does have sporadic postprandial spikes at all meals but generally insignificant and mostly on 3 of the 14 days analyzed, higher  readings appear to be after lunch  On an average blood sugars postprandial and not rising excessively with highest average 152 in the afternoon No hypoglycemia during the day except rarely midday  CGM use % of time 97  2-week average/GV 137/30  Time in range 84  % Time Above 180 13  % Time above 250   % Time Below 70 2     Previously: CGM use % of time   2-week average/GV 160/24  Time in range     68   %  % Time Above 180 30+2  % Time above 250   % Time Below 70      PRE-MEAL Fasting Lunch Dinner Bedtime Overall  Glucose range:       Averages: 148  167 173    POST-MEAL PC Breakfast PC Lunch PC Dinner  Glucose range:     Averages: 184 148 193     Meals: 2-3  meals per day, usually some form of bread and milk in the morning, relatively smaller lunch, sometimes having eggs at lunchtime., sometimes sweets in pm, fried food occasionally        Dinner usually 5-6 PM         Dietician visit: Most recent: None             Wt Readings from Last 3  Encounters:  04/24/22 179 lb 9.6 oz (81.5 kg)  04/08/22 185 lb 12.8 oz (84.3 kg)  12/25/21 185 lb (83.9 kg)   LABS:  Lab Results  Component Value Date   HGBA1C 7.3 (H) 04/18/2022   HGBA1C 7.7 (H) 12/23/2021   HGBA1C 7.4 (H) 06/26/2021   Lab Results  Component Value Date   MICROALBUR <0.2 04/18/2022   LDLCALC 65 04/18/2022   CREATININE 0.84 04/18/2022    Other active problems: See review of systems   Allergies as of 04/24/2022   No Known Allergies      Medication List        Accurate as of April 24, 2022  1:54 PM. If you have any questions, ask your nurse or doctor.          Alcohol Swabs 70 % Pads Use as directed   amLODipine-olmesartan 5-40 MG tablet Commonly known as: AZOR Take 1 tablet by mouth daily at 10 pm.   aspirin 81 MG chewable tablet Chew 1 tablet (81 mg total) by mouth daily.   atorvastatin 40 MG tablet Commonly known as: LIPITOR TAKE 1 TABLET BY MOUTH EVERYDAY AT BEDTIME    carvedilol 12.5 MG tablet Commonly known as: COREG Take 1 tablet (12.5 mg total) by mouth 2 (two) times daily.   Contour Next Test test strip Generic drug: glucose blood USE TO CHECK BLOOD SUGAR TWICE A DAY   empagliflozin 25 MG Tabs tablet Commonly known as: Jardiance Take 1 tablet (25 mg total) by mouth daily before breakfast.   FreeStyle Libre 14 Day Sensor Misc USE EVERY 14 DAYS AS DIRECTED  DX E11.65   gabapentin 300 MG capsule Commonly known as: NEURONTIN TAKE 1 CAPSULE BY MOUTH THREE TIMES A DAY What changed: additional instructions   INSULIN SYRINGE 1CC/31GX5/16" 31G X 5/16" 1 ML Misc Use to inject lantus daily   metFORMIN 500 MG 24 hr tablet Commonly known as: GLUCOPHAGE-XR TAKE 1 TABLET BY MOUTH IN THE MORNING AND 2 TABLETS IN THE EVENING   Microlet Lancets Misc Use to check blood sugar 2 times a day   NovoLOG FlexPen 100 UNIT/ML FlexPen Generic drug: insulin aspart INJECT 35 UNITS INTO THE SKIN IN THE MORNING, AT NOON, AND AT BEDTIME. What changed: how much to take   spironolactone 25 MG tablet Commonly known as: ALDACTONE Take 25 mg by mouth daily.   Tyler Aas FlexTouch 200 UNIT/ML FlexTouch Pen Generic drug: insulin degludec Inject 30 Units into the skin at bedtime. What changed: how much to take        Allergies: No Known Allergies  Past Medical History:  Diagnosis Date   Diabetes mellitus    H/O epistaxis    History of stomach ulcers    30 YRS AGO   Hypercholesteremia    Hypertension    Shoulder pain, left    Umbilical hernia     Past Surgical History:  Procedure Laterality Date   CARDIAC CATHETERIZATION  1997   COLONOSCOPY     UMBILICAL HERNIA REPAIR N/A 07/11/2015   Procedure: LAPAROSCOPIC ASSISTED OPEN UMBILICAL HERNIA;  Surgeon: Johnathan Hausen, MD;  Location: [facility];  Service: General;  Laterality: N/A;  With MESH    Family History  Problem Relation Age of Onset   Heart disease Mother    Diabetes Father    Diabetes Sister     No colon cancer  Social History:  reports that he quit smoking about 24 years ago. His smoking use included cigarettes. He has a 10.00 pack-year  smoking history. He has never used smokeless tobacco. He reports that he does not drink alcohol and does not use drugs.  Review of Systems:   HYPERTENSION:  he has had long-standing hypertension Has been taking carvedilol, diltiazem 240 and Benicar 40 mg Also on Jardiance  BP Readings from Last 3 Encounters:  04/08/22 133/73  12/25/21 118/68  10/07/21 117/60   Lab Results  Component Value Date   CREATININE 0.84 04/18/2022   BUN 23 04/18/2022   NA 134 (L) 04/18/2022   K 4.7 04/18/2022   CL 98 04/18/2022   CO2 24 04/18/2022     HYPERLIPIDEMIA: The lipid abnormality consists of elevated LDL.    He  is taking generic Lipitor 40 mg daily He was previously tried on a lower dose by the cardiologist because of muscle aches but is back on 40 mg for better LDL control  No complaints of side effects today LDL is controlled but triglycerides are high nonfasting   Has had some evidence of CAD in the past but asymptomatic    Lab Results  Component Value Date   CHOL 140 04/18/2022   CHOL 151 04/09/2022   CHOL 176 12/23/2021   Lab Results  Component Value Date   HDL 45 04/18/2022   HDL 43 04/09/2022   HDL 51.60 12/23/2021   Lab Results  Component Value Date   LDLCALC 65 04/18/2022   LDLCALC 84 04/09/2022   LDLCALC 105 (H) 12/23/2021   Lab Results  Component Value Date   TRIG 259 (H) 04/18/2022   TRIG 133 04/09/2022   TRIG 94.0 12/23/2021   Lab Results  Component Value Date   CHOLHDL 3.1 04/18/2022   CHOLHDL 3 12/23/2021   CHOLHDL 3 02/27/2021   Lab Results  Component Value Date   LDLDIRECT 85 04/09/2022   LDLDIRECT 109 (H) 10/09/2021   LDLDIRECT 89.8 12/29/2013    Asking about insomnia  He has had burning and pains in his lower legs periodically      Examination:   Ht '5\' 3"'$  (1.6 m)   Wt 179 lb 9.6 oz  (81.5 kg)   BMI 31.81 kg/m   Body mass index is 31.81 kg/m.    ASSESSMENT/ PLAN:   Diabetes type 2 insulin-dependent  See history of present illness for detailed discussion of his current management, blood sugar patterns and problems identified  His A1c is down to 7.3  He is on basal bolus insulin, Ozempic 0.5, Jardiance and metformin  Although he is doing better with Ozempic and his control is improved he is having vomiting with Ozempic Also weight is coming down with improved satiety Also not clear if his insurance will cover GLP-1 drugs as he has not had a prescription   HYPERTENSION: Blood pressure is well-controlled  Renal function stable   RECOMMENDATIONS for diabetes: Trial of Trulicity instead of Ozempic Discussed with the patient the nature of GLP-1 drugs, the action on various organ systems, improved satiety, how they benefit blood glucose control, as well as the benefit of weight loss. Explained possible side effects of TRULICITY, most commonly nausea that usually improves over time; discussed safety information in package insert.   To start with 0.75 mg dosage weekly for the first 4 injections and then he will call us to get a new prescription if tolerated  Patient brochure on Trulicity with enclosed co-pay card given  Patient brochure on Trulicity with the 3.81 mg sample and enclosed 30-day trial card given   No change in insulin  at this time but if he is start seeing his blood sugars getting lower after meals he can cut back on the NovoLog to 50%  For his late insomnia at night he needs to discuss further with his PCP  Lipids: Well-controlled and he can continue Lipitor 40 mg  There are no Patient Instructions on file for this visit.     Elayne Snare 04/24/2022, 1:54 PM   Note: This office note was prepared with Dragon voice recognition system technology. Any transcriptional errors that result from this process are unintentional.

## 2022-04-30 ENCOUNTER — Encounter: Payer: Self-pay | Admitting: Endocrinology

## 2022-05-01 DIAGNOSIS — F411 Generalized anxiety disorder: Secondary | ICD-10-CM | POA: Diagnosis not present

## 2022-05-01 DIAGNOSIS — F324 Major depressive disorder, single episode, in partial remission: Secondary | ICD-10-CM | POA: Diagnosis not present

## 2022-05-14 DIAGNOSIS — M19011 Primary osteoarthritis, right shoulder: Secondary | ICD-10-CM | POA: Diagnosis not present

## 2022-05-26 ENCOUNTER — Other Ambulatory Visit: Payer: Self-pay | Admitting: Endocrinology

## 2022-05-26 ENCOUNTER — Other Ambulatory Visit: Payer: Self-pay | Admitting: Cardiology

## 2022-05-26 DIAGNOSIS — I10 Essential (primary) hypertension: Secondary | ICD-10-CM

## 2022-06-04 DIAGNOSIS — F411 Generalized anxiety disorder: Secondary | ICD-10-CM | POA: Diagnosis not present

## 2022-06-04 DIAGNOSIS — E1169 Type 2 diabetes mellitus with other specified complication: Secondary | ICD-10-CM | POA: Diagnosis not present

## 2022-06-04 DIAGNOSIS — Z23 Encounter for immunization: Secondary | ICD-10-CM | POA: Diagnosis not present

## 2022-06-04 DIAGNOSIS — N529 Male erectile dysfunction, unspecified: Secondary | ICD-10-CM | POA: Diagnosis not present

## 2022-06-08 ENCOUNTER — Other Ambulatory Visit: Payer: Self-pay | Admitting: Cardiology

## 2022-06-08 DIAGNOSIS — I10 Essential (primary) hypertension: Secondary | ICD-10-CM

## 2022-07-17 ENCOUNTER — Other Ambulatory Visit: Payer: Self-pay | Admitting: Cardiology

## 2022-07-17 ENCOUNTER — Other Ambulatory Visit: Payer: Self-pay | Admitting: Endocrinology

## 2022-07-17 DIAGNOSIS — I10 Essential (primary) hypertension: Secondary | ICD-10-CM

## 2022-07-24 ENCOUNTER — Other Ambulatory Visit: Payer: Medicare HMO

## 2022-07-28 ENCOUNTER — Ambulatory Visit: Payer: Medicare HMO | Admitting: Endocrinology

## 2022-08-04 ENCOUNTER — Telehealth: Payer: Self-pay

## 2022-08-04 DIAGNOSIS — E1165 Type 2 diabetes mellitus with hyperglycemia: Secondary | ICD-10-CM

## 2022-08-04 MED ORDER — TRESIBA FLEXTOUCH 200 UNIT/ML ~~LOC~~ SOPN: 32.0000 [IU] | PEN_INJECTOR | Freq: Every day | SUBCUTANEOUS | 2 refills | Status: DC

## 2022-08-04 NOTE — Telephone Encounter (Signed)
Pt lvm for new rx and clinical notes to be sent to [facility] for [employer] supplies.  Pt requested a new rx for Tresiba reflecting 32 units daily. New rx sent. Parachute order placed. Pt advised.

## 2022-08-07 DIAGNOSIS — E1165 Type 2 diabetes mellitus with hyperglycemia: Secondary | ICD-10-CM | POA: Diagnosis not present

## 2022-08-11 ENCOUNTER — Telehealth: Payer: Self-pay

## 2022-08-11 ENCOUNTER — Other Ambulatory Visit: Payer: Self-pay | Admitting: Endocrinology

## 2022-08-11 DIAGNOSIS — M79673 Pain in unspecified foot: Secondary | ICD-10-CM

## 2022-08-11 MED ORDER — GABAPENTIN 300 MG PO CAPS: ORAL_CAPSULE | ORAL | 1 refills | Status: AC

## 2022-08-11 NOTE — Telephone Encounter (Signed)
Are you still prescribing Gabapentin for patient.

## 2022-08-19 DIAGNOSIS — F339 Major depressive disorder, recurrent, unspecified: Secondary | ICD-10-CM | POA: Diagnosis not present

## 2022-08-19 DIAGNOSIS — G4733 Obstructive sleep apnea (adult) (pediatric): Secondary | ICD-10-CM | POA: Diagnosis not present

## 2022-08-19 DIAGNOSIS — Z1211 Encounter for screening for malignant neoplasm of colon: Secondary | ICD-10-CM | POA: Diagnosis not present

## 2022-08-19 DIAGNOSIS — Z794 Long term (current) use of insulin: Secondary | ICD-10-CM | POA: Diagnosis not present

## 2022-08-19 DIAGNOSIS — I10 Essential (primary) hypertension: Secondary | ICD-10-CM | POA: Diagnosis not present

## 2022-08-19 DIAGNOSIS — E1165 Type 2 diabetes mellitus with hyperglycemia: Secondary | ICD-10-CM | POA: Diagnosis not present

## 2022-08-19 DIAGNOSIS — N529 Male erectile dysfunction, unspecified: Secondary | ICD-10-CM | POA: Diagnosis not present

## 2022-08-19 LAB — HEMOGLOBIN A1C: Hemoglobin A1C: 7.2

## 2022-08-29 ENCOUNTER — Other Ambulatory Visit: Payer: Self-pay | Admitting: Endocrinology

## 2022-08-31 ENCOUNTER — Other Ambulatory Visit: Payer: Self-pay | Admitting: Cardiology

## 2022-08-31 DIAGNOSIS — I10 Essential (primary) hypertension: Secondary | ICD-10-CM

## 2022-09-01 DIAGNOSIS — M79602 Pain in left arm: Secondary | ICD-10-CM | POA: Diagnosis not present

## 2022-09-01 DIAGNOSIS — M79601 Pain in right arm: Secondary | ICD-10-CM | POA: Diagnosis not present

## 2022-09-01 DIAGNOSIS — M19012 Primary osteoarthritis, left shoulder: Secondary | ICD-10-CM | POA: Diagnosis not present

## 2022-09-12 ENCOUNTER — Telehealth: Payer: Self-pay

## 2022-09-12 DIAGNOSIS — E1165 Type 2 diabetes mellitus with hyperglycemia: Secondary | ICD-10-CM

## 2022-09-12 MED ORDER — DAPAGLIFLOZIN PROPANEDIOL 10 MG PO TABS: 10.0000 mg | ORAL_TABLET | Freq: Every day | ORAL | 1 refills | Status: DC

## 2022-09-12 NOTE — Telephone Encounter (Signed)
Patient states that he needs something else sent in that he cannot afford the jardience with his insurance. Please change to something cheaper.

## 2022-09-12 NOTE — Telephone Encounter (Signed)
LMOM for patient to return call. Sent in farxiga per Dr Lucianne Muss suggestion.

## 2022-09-15 ENCOUNTER — Other Ambulatory Visit (HOSPITAL_COMMUNITY): Payer: Self-pay

## 2022-09-17 DIAGNOSIS — Z961 Presence of intraocular lens: Secondary | ICD-10-CM | POA: Diagnosis not present

## 2022-09-17 DIAGNOSIS — H401232 Low-tension glaucoma, bilateral, moderate stage: Secondary | ICD-10-CM | POA: Diagnosis not present

## 2022-09-17 DIAGNOSIS — H2512 Age-related nuclear cataract, left eye: Secondary | ICD-10-CM | POA: Diagnosis not present

## 2022-09-18 ENCOUNTER — Telehealth: Payer: Self-pay

## 2022-09-18 NOTE — Telephone Encounter (Signed)
Patient says Austin Norris is to expensive and would like an alternative.

## 2022-09-19 NOTE — Telephone Encounter (Signed)
Patient says Austin Norris is $400.  He will contact his insurance and let us know what is covered.

## 2022-09-23 ENCOUNTER — Other Ambulatory Visit (INDEPENDENT_AMBULATORY_CARE_PROVIDER_SITE_OTHER): Payer: Medicare PPO

## 2022-09-23 DIAGNOSIS — E1165 Type 2 diabetes mellitus with hyperglycemia: Secondary | ICD-10-CM | POA: Diagnosis not present

## 2022-09-23 DIAGNOSIS — Z794 Long term (current) use of insulin: Secondary | ICD-10-CM | POA: Diagnosis not present

## 2022-09-24 LAB — BASIC METABOLIC PANEL
BUN: 13 mg/dL (ref 6–23)
CO2: 26 mEq/L (ref 19–32)
Calcium: 9.2 mg/dL (ref 8.4–10.5)
Chloride: 103 mEq/L (ref 96–112)
Creatinine, Ser: 0.93 mg/dL (ref 0.40–1.50)
GFR: 84.91 mL/min (ref >60.00)
Glucose, Bld: 121 mg/dL — ABNORMAL HIGH (ref 70–99)
Potassium: 4.1 mEq/L (ref 3.5–5.1)
Sodium: 138 mEq/L (ref 135–145)

## 2022-09-25 ENCOUNTER — Other Ambulatory Visit: Payer: Self-pay | Admitting: Endocrinology

## 2022-09-25 ENCOUNTER — Encounter: Payer: Self-pay | Admitting: Endocrinology

## 2022-09-25 ENCOUNTER — Ambulatory Visit: Payer: Medicare PPO | Admitting: Endocrinology

## 2022-09-25 VITALS — BP 130/72 | HR 61 | Ht 63.0 in | Wt 190.4 lb

## 2022-09-25 DIAGNOSIS — Z794 Long term (current) use of insulin: Secondary | ICD-10-CM | POA: Diagnosis not present

## 2022-09-25 DIAGNOSIS — I10 Essential (primary) hypertension: Secondary | ICD-10-CM | POA: Diagnosis not present

## 2022-09-25 DIAGNOSIS — E1165 Type 2 diabetes mellitus with hyperglycemia: Secondary | ICD-10-CM | POA: Diagnosis not present

## 2022-09-25 DIAGNOSIS — E7800 Pure hypercholesterolemia, unspecified: Secondary | ICD-10-CM

## 2022-09-25 MED ORDER — TADALAFIL 10 MG PO TABS: ORAL_TABLET | ORAL | 2 refills | Status: DC

## 2022-09-25 MED ORDER — ROSUVASTATIN CALCIUM 5 MG PO TABS: 5.0000 mg | ORAL_TABLET | Freq: Every day | ORAL | 3 refills | Status: DC

## 2022-09-25 NOTE — Patient Instructions (Addendum)
Novolog 20 in am, 20-25 at dinner  Call when out of donut hole  Ozempic 0.25 weekly

## 2022-09-25 NOTE — Progress Notes (Signed)
Patient ID: Austin Norris, male   DOB: November 23, 1955, 67 y.o.   MRN: [id number]   Reason for Appointment: Follow-up   History of Present Illness   Diagnosis: Type 2 DIABETES MELITUS, long-standing     He has been on insulin to treat his diabetes for several years partly because of failure of oral agents and also for reducing cost of medications He is generally checking his blood sugars once or twice a day but does not keep a record Overall has had somewhat poor control with A1c consistently over 7%  Recent history:    Insulin regimen: Tresiba 32 units at bedtime, Novolog usually usually 30 units before meals  Oral hypoglycemic drugs:, metformin ER 1 g daily  A1c is 7.2 last month  Current blood sugar patterns, management and problems identified:  He was previously taking Ozempic and this was changed to Trulicity because he had complained about vomiting from this However he now says that he only had 1 episode and his main difficulty was significant decrease in appetite He is continuing 0.75 mg of Trulicity With this his overall control level is similar to before However has gained 11 pounds since his last visit since Trulicity is not decreasing his appetite is much Currently however he is in the donut hole likely as he is not able to afford both the [place] and Trulicity On his own he has increased his NovoLog to 30 units in the morning because he is waking up with relatively higher fasting readings; however with still relatively low carbohydrate meal in the morning with no protein he will frequently have low normal readings late morning Blood sugars after dinner are variable but occasionally low around midnight such as last night     Side effects from medications: None         CGM device freestyle libre version 3  Interpretation of the recent 2-week download as follows  Summary of patterns: Overall higher blood sugars are present overnight and generally fairly  stable during the day in the low 100s with minimal hypoglycemia   Overnight blood sugars are higher compared to his last visit and generally between 140-170 but occasionally low normal around bedtime Premeal blood sugars are steady at about 130-140 average at lunch and dinner  Postprandial readings are on an average relatively lower after breakfast but after lunch and dinner relatively evenly controlled  Occasionally may have low normal readings late in the evening  Minimal hypoglycemia present, mostly with readings in the 60s either after breakfast and occasionally after lunch or dinner  Hyperglycemia may occur sometimes preceded by the low sugars late evening  CGM use % of time   2-week average/GV 149/26  Time in range   79     % was 84  % Time Above 180 19+1  % Time above 250   % Time Below 70 1     PRE-MEAL Fasting Lunch Dinner Bedtime Overall  Glucose range:       Averages: 167       POST-MEAL PC Breakfast PC Lunch PC Dinner  Glucose range:     Averages: 129 143 150   Previously  CGM use % of time 97  2-week average/GV 137/30  Time in range 84  % Time Above 180 13  % Time above 250   % Time Below 70 2      Meals: 2-3  meals per day, usually some form of bread and  milk in the morning, relatively smaller lunch, sometimes having eggs at lunchtime., sometimes sweets in pm, fried food occasionally        Dinner usually 5-6 PM         Dietician visit: Most recent: None             Wt Readings from Last 3 Encounters:  09/25/22 190 lb 6.4 oz (86.4 kg)  04/24/22 179 lb 9.6 oz (81.5 kg)  04/08/22 185 lb 12.8 oz (84.3 kg)   LABS:  Lab Results  Component Value Date   HGBA1C 7.2 08/19/2022   HGBA1C 7.3 (H) 04/18/2022   HGBA1C 7.7 (H) 12/23/2021   Lab Results  Component Value Date   MICROALBUR <0.2 04/18/2022   LDLCALC 65 04/18/2022   CREATININE 0.93 09/23/2022    Other active problems: See review of systems   Allergies as of 09/25/2022   No Known Allergies       Medication List        Accurate as of Sep 25, 2022  9:23 PM. If you have any questions, ask your nurse or doctor.          STOP taking these medications    atorvastatin 40 MG tablet Commonly known as: LIPITOR Stopped by: Reather Littler, MD       TAKE these medications    Alcohol Swabs 70 % Pads Use as directed   amLODipine-olmesartan 5-40 MG tablet Commonly known as: AZOR TAKE 1 TABLET BY MOUTH DAILY AT 10 PM.   aspirin 81 MG chewable tablet Chew 1 tablet (81 mg total) by mouth daily.   carvedilol 12.5 MG tablet Commonly known as: COREG TAKE 1 TABLET TWICE DAILY   Contour Next Test test strip Generic drug: glucose blood USE TO CHECK BLOOD SUGAR TWICE A DAY   dapagliflozin propanediol 10 MG Tabs tablet Commonly known as: Farxiga Take 1 tablet (10 mg total) by mouth daily before breakfast.   FreeStyle Libre 3 Sensor Misc Place 1 sensor on the skin every 14 days. Use to check glucose continuously   gabapentin 300 MG capsule Commonly known as: NEURONTIN TAKE 1 CAPSULE BY MOUTH THREE TIMES A DAY AS NEEDED   INSULIN SYRINGE 1CC/31GX5/16" 31G X 5/16" 1 ML Misc Use to inject lantus daily   latanoprost 0.005 % ophthalmic solution Commonly known as: XALATAN SMARTSIG:In Eye(s)   metFORMIN 500 MG 24 hr tablet Commonly known as: GLUCOPHAGE-XR TAKE 1 TABLET BY MOUTH IN THE MORNING AND 2 TABLETS IN THE EVENING   Microlet Lancets Misc Use to check blood sugar 2 times a day   NovoLOG FlexPen 100 UNIT/ML FlexPen Generic drug: insulin aspart INJECT 35 UNITS INTO THE SKIN IN THE MORNING, AT NOON, AND AT BEDTIME. What changed: how much to take   omeprazole 40 MG capsule Commonly known as: PRILOSEC Take 40 mg by mouth daily.   rosuvastatin 5 MG tablet Commonly known as: Crestor Take 1 tablet (5 mg total) by mouth daily. Started by: Reather Littler, MD   spironolactone 25 MG tablet Commonly known as: ALDACTONE Take 25 mg by mouth daily.   tadalafil 10 MG  tablet Commonly known as: CIALIS Use as needed Started by: Reather Littler, MD   Evaristo Bury FlexTouch 200 UNIT/ML FlexTouch Pen Generic drug: insulin degludec Inject 32 Units into the skin at bedtime.   Trulicity 0.75 MG/0.5ML Sopn Generic drug: Dulaglutide INJECT IN THE ABDOMINAL SKIN AS DIRECTED ONCE A WEEK        Allergies: No Known Allergies  Past  Medical History:  Diagnosis Date   Diabetes mellitus    H/O epistaxis    History of stomach ulcers    30 YRS AGO   Hypercholesteremia    Hypertension    Shoulder pain, left    Umbilical hernia     Past Surgical History:  Procedure Laterality Date   CARDIAC CATHETERIZATION  1997   COLONOSCOPY     UMBILICAL HERNIA REPAIR N/A 07/11/2015   Procedure: LAPAROSCOPIC ASSISTED OPEN UMBILICAL HERNIA;  Surgeon: Luretha Murphy, MD;  Location: [facility];  Service: General;  Laterality: N/A;  With MESH    Family History  Problem Relation Age of Onset   Heart disease Mother    Diabetes Father    Diabetes Sister    No colon cancer  Social History:  reports that he quit smoking about 25 years ago. His smoking use included cigarettes. He has a 10.00 pack-year smoking history. He has never used smokeless tobacco. He reports that he does not drink alcohol and does not use drugs.  Review of Systems:   HYPERTENSION:  he has had long-standing hypertension Has been taking carvedilol, diltiazem 240 and Benicar 40 mg Currently not on Jardiance  BP Readings from Last 3 Encounters:  09/25/22 130/72  04/24/22 118/78  04/08/22 133/73   Electrolytes:  Lab Results  Component Value Date   CREATININE 0.93 09/23/2022   BUN 13 09/23/2022   NA 138 09/23/2022   K 4.1 09/23/2022   CL 103 09/23/2022   CO2 26 09/23/2022     HYPERLIPIDEMIA: The lipid abnormality consists of elevated LDL.    He  is taking generic Lipitor 40 mg daily He was previously tried on a lower dose by the cardiologist because of muscle aches but was told to go back on 40 mg  for better LDL control And now says that when he left off the Lipitor for some time he had no muscle aches and wants to change the medication Previous lipids were controlled   Has had some evidence of CAD in the past but asymptomatic    Lab Results  Component Value Date   CHOL 140 04/18/2022   CHOL 151 04/09/2022   CHOL 176 12/23/2021   Lab Results  Component Value Date   HDL 45 04/18/2022   HDL 43 04/09/2022   HDL 51.60 12/23/2021   Lab Results  Component Value Date   LDLCALC 65 04/18/2022   LDLCALC 84 04/09/2022   LDLCALC 105 (H) 12/23/2021   Lab Results  Component Value Date   TRIG 259 (H) 04/18/2022   TRIG 133 04/09/2022   TRIG 94.0 12/23/2021   Lab Results  Component Value Date   CHOLHDL 3.1 04/18/2022   CHOLHDL 3 12/23/2021   CHOLHDL 3 02/27/2021   Lab Results  Component Value Date   LDLDIRECT 85 04/09/2022   LDLDIRECT 109 (H) 10/09/2021   LDLDIRECT 89.8 12/29/2013     He has had burning and pains in his lower legs periodically  Has ED and he says that Viagra did not help much and caused insomnia, this was given by his PCP     Examination:   BP 130/72   Pulse 61   Ht 5\' 3"  (1.6 m)   Wt 190 lb 6.4 oz (86.4 kg)   SpO2 97%   BMI 33.73 kg/m   Body mass index is 33.73 kg/m.   Diabetic Foot Exam - Simple   Simple Foot Form Diabetic Foot exam was performed with the following findings: Yes  Visual Inspection No deformities, no ulcerations, no other skin breakdown bilaterally: Yes Sensation Testing Intact to touch and monofilament testing bilaterally: Yes Pulse Check Posterior Tibialis and Dorsalis pulse intact bilaterally: Yes Comments     ASSESSMENT/ PLAN:   Diabetes type 2 insulin-dependent  See history of present illness for detailed discussion of his current management, blood sugar patterns and problems identified  His A1c is down to 7.3  He is on basal bolus insulin, Trulicity 0.75, Jardiance and metformin  Although he was doing  better with Ozempic and had lost weight he likely had excessive diuretics he had nausea with this However he says that he wants to go back to Ozempic especially since Trulicity is not covered He says he has a supply of Ozempic from [place] and should be able to continue this on his own However he will try to use 0.25 mg for some time to make sure he is adjusted to it and let us know if this is not effective For now will not refill his Jardiance since he cannot afford it and he will let us know when he is out of the donut hole  Since he is getting too much insulin for breakfast coverage he will reduce it to 20 units and likely needs to take not more than 25 at dinnertime also No change in [place]  Foot exam normal  HYPERTENSION: Blood pressure is well-controlled  Renal function stable   RECOMMENDATIONS for diabetes:   Erectile dysfunction: He can try Cialis 10 mg as needed since he prefers not to continue Viagra, however not clear if this will be affordable  Lipids: Well-controlled with Lipitor but now he says that he has muscle aches with this and wants to change to Crestor which his brother is taking He will start with 5 mg daily for now  Patient Instructions  Novolog 20 in am, 20-25 at dinner  Call when out of donut hole  Ozempic 0.25 weekly     Reather Littler 09/25/2022, 9:23 PM   Note: This office note was prepared with Dragon voice recognition system technology. Any transcriptional errors that result from this process are unintentional.

## 2022-10-01 ENCOUNTER — Encounter: Payer: Self-pay | Admitting: Endocrinology

## 2022-10-15 DIAGNOSIS — M545 Low back pain, unspecified: Secondary | ICD-10-CM | POA: Diagnosis not present

## 2022-10-15 DIAGNOSIS — M62830 Muscle spasm of back: Secondary | ICD-10-CM | POA: Diagnosis not present

## 2022-10-21 DIAGNOSIS — H401232 Low-tension glaucoma, bilateral, moderate stage: Secondary | ICD-10-CM | POA: Diagnosis not present

## 2022-10-22 ENCOUNTER — Ambulatory Visit (HOSPITAL_BASED_OUTPATIENT_CLINIC_OR_DEPARTMENT_OTHER): Payer: Medicare PPO | Admitting: Physical Therapy

## 2022-10-23 ENCOUNTER — Encounter (HOSPITAL_BASED_OUTPATIENT_CLINIC_OR_DEPARTMENT_OTHER): Payer: Self-pay | Admitting: Physical Therapy

## 2022-10-23 ENCOUNTER — Ambulatory Visit (HOSPITAL_BASED_OUTPATIENT_CLINIC_OR_DEPARTMENT_OTHER): Payer: Medicare PPO | Attending: Sports Medicine | Admitting: Physical Therapy

## 2022-10-23 ENCOUNTER — Other Ambulatory Visit: Payer: Self-pay

## 2022-10-23 DIAGNOSIS — M25511 Pain in right shoulder: Secondary | ICD-10-CM | POA: Diagnosis not present

## 2022-10-23 DIAGNOSIS — G8929 Other chronic pain: Secondary | ICD-10-CM | POA: Insufficient documentation

## 2022-10-23 DIAGNOSIS — R293 Abnormal posture: Secondary | ICD-10-CM | POA: Insufficient documentation

## 2022-10-23 NOTE — Therapy (Signed)
OUTPATIENT PHYSICAL THERAPY EVALUATION   Patient Name: Austin Norris MRN: [id number] DOB:[birth year], 67 y.o., male Today's Date: 10/23/2022  END OF SESSION:  PT End of Session - 10/23/22 1145     Visit Number 1    Number of Visits 1    Date for PT Re-Evaluation 10/23/22    Authorization Type [employer] MCR    PT Start Time 1145    PT Stop Time 1212    PT Time Calculation (min) 27 min    Activity Tolerance Patient tolerated treatment well    Behavior During Therapy WFL for tasks assessed/performed             Past Medical History:  Diagnosis Date   Diabetes mellitus    H/O epistaxis    History of stomach ulcers    30 YRS AGO   Hypercholesteremia    Hypertension    Shoulder pain, left    Umbilical hernia    Past Surgical History:  Procedure Laterality Date   CARDIAC CATHETERIZATION  1997   COLONOSCOPY     UMBILICAL HERNIA REPAIR N/A 07/11/2015   Procedure: LAPAROSCOPIC ASSISTED OPEN UMBILICAL HERNIA;  Surgeon: Luretha Murphy, MD;  Location: [facility];  Service: General;  Laterality: N/A;  With MESH   Patient Active Problem List   Diagnosis Date Noted   Delirium    Acute metabolic encephalopathy 03/22/2020   Type II or unspecified type diabetes mellitus without mention of complication, uncontrolled 12/10/2012   Pure hypercholesterolemia 12/10/2012   Unspecified essential hypertension 12/10/2012    REFERRING PROVIDER:  Christena Deem, MD    REFERRING DIAG: M79.601 (ICD-10-CM) - Pain in right arm   Rationale for Evaluation and Treatment: Rehabilitation  THERAPY DIAG:  Chronic right shoulder pain  Abnormal posture  ONSET DATE: 2-3 mo ago   SUBJECTIVE:                                                                                                                                                                                           SUBJECTIVE STATEMENT: I think it may have started when I got my flu shot. When I want to go to sleep, I feel it very clear  in right shoulder. Sometimes it moves to left shoulder also. Had about 10 days where I could not move because of my back. Tingling on and off in Left hand- all fingers. Occasional neck pain.   PERTINENT HISTORY:  none  PAIN:  Are you having pain? Yes: NPRS scale: a little bit/10 Pain location: rubs right shoulder Pain description: achey Aggravating factors: laying down to rest Relieving factors: rubs  PRECAUTIONS:  None  WEIGHT BEARING RESTRICTIONS:  No  FALLS:  Has patient fallen in last 6 months? No  OCCUPATION:  Transport planner of car lot, computer work at desk  PLOF:  Independent  PATIENT GOALS:  sleep   OBJECTIVE:   DIAGNOSTIC FINDINGS:  MRI 04/04/22 IMPRESSION: 1. Distal to the shoulder, no significant findings identified in the right upper arm. 2. Rotator cuff tendinosis with probable high-grade partial tearing of the distal supraspinatus tendon, suboptimally evaluated due to large field of view. Consider dedicated shoulder MRI for further evaluation. 3. Mild glenohumeral and acromioclavicular degenerative changes.  PATIENT SURVEYS:  FOTO 53  COGNITIVE STATUS: Within functional limits for tasks assessed   RED FLAGS: None    POSTURE:  EVAL: incr thoracic kyphosis, bil shoulder fwd rotation  HAND DOMINANCE:  Right    Body Part #1 Shoulder  PALPATION: EVAL: denies TTP through neck/shoulders  Eval: ROM is equal and WFL but reports discomfort at end range flexion on Right side    TREATMENT:                                                                                                                               DATE: 6/27 EVAL Seated Scapular Retraction  - 5 x daily - 7 x weekly - 10 reps - Standing Upper Trapezius Stretch  - 2 x daily - 7 x weekly - 2 sets - 3 breaths hold - Gentle Levator Scapulae Stretch  - 2 x daily - 7 x weekly - 2 sets - 3 breaths hold - Doorway Pec Stretch at 90 Degrees Abduction  - 2 x daily - 7 x weekly - 2 sets - 3  breaths hold  Discussed posture corrector    PATIENT EDUCATION:  Education details: Anatomy of condition, POC, HEP, exercise form/rationale Person educated: Patient Education method: Explanation, Demonstration, Tactile cues, Verbal cues, and Handouts Education comprehension: verbalized understanding, returned demonstration, verbal cues required, tactile cues required, and needs further education  HOME EXERCISE PROGRAM: [id number]   ASSESSMENT:  CLINICAL IMPRESSION: Patient is a 67 y.o. M who was seen today for physical therapy evaluation and treatment for right shoulder pain. He works on a computer for long hours with poor posture but denies that the shoulder is the biggest issue. He will be traveling for about 5 weeks starting this weekend so we will only do 1 visit for shoulder today and f/u when he obtains a script for his lumbar spine.     OBJECTIVE IMPAIRMENTS: postural dysfunction.   ACTIVITY LIMITATIONS:  sleepign  PARTICIPATION LIMITATIONS:  sleep  PERSONAL FACTORS:  work requirements  are also affecting patient's functional outcome.   REHAB POTENTIAL: Good  CLINICAL DECISION MAKING: Stable/uncomplicated  EVALUATION COMPLEXITY: Low   GOALS: Goals reviewed with patient? Yes 1x visit, goals not indicated   PLAN:  PT FREQUENCY:  1 visit  PT DURATION: other: eval date  PLANNED INTERVENTIONS: Therapeutic exercises and Self Care.   Vernal Rutan C. Marsia Cino  PT, DPT 10/23/22 12:22 PM  Referring diagnosis? M79.601 Treatment diagnosis? (if different than referring diagnosis) R29.3 What was this (referring dx) caused by? []  Surgery []  Fall [x]  Ongoing issue []  Arthritis []  Other: ____________  Laterality: [x]  Rt []  Lt []  Both  Check all possible CPT codes:  *CHOOSE 10 OR LESS*    [x]  97110 (Therapeutic Exercise)  []  92507 (SLP Treatment)  []  97112 (Neuro Re-ed)   []  92526 (Swallowing Treatment)   []  97116 (Gait Training)   []  [id number] (Cognitive Training, 1st  15 minutes) []  97140 (Manual Therapy)   []  97130 (Cognitive Training, each add'l 15 minutes)  []  95284 (Re-evaluation)                              []  Other, List CPT Code ____________  []  97530 (Therapeutic Activities)     [x]  97535 (Self Care)   []  All codes above (97110 - 97535)  []  97012 (Mechanical Traction)  []  97014 (E-stim Unattended)  []  97032 (E-stim manual)  []  97033 (Ionto)  []  97035 (Ultrasound) []  97750 (Physical Performance Training) []  [id number] (Aquatic Therapy) []  97016 (Vasopneumatic Device) []  [id number] (Paraffin) []  97034 (Contrast Bath) []  97597 (Wound Care 1st 20 sq cm) []  97598 (Wound Care each add'l 20 sq cm) []  97760 (Orthotic Fabrication, Fitting, Training Initial) []  [id number] (Prosthetic Management and Training Initial) []  [id number] (Orthotic or Prosthetic Training/ Modification Subsequent)

## 2022-12-05 DIAGNOSIS — E1165 Type 2 diabetes mellitus with hyperglycemia: Secondary | ICD-10-CM | POA: Diagnosis not present

## 2022-12-17 ENCOUNTER — Other Ambulatory Visit: Payer: Self-pay | Admitting: Cardiology

## 2022-12-17 DIAGNOSIS — I10 Essential (primary) hypertension: Secondary | ICD-10-CM

## 2022-12-20 ENCOUNTER — Ambulatory Visit (HOSPITAL_BASED_OUTPATIENT_CLINIC_OR_DEPARTMENT_OTHER): Payer: Medicare PPO | Attending: Sports Medicine | Admitting: Physical Therapy

## 2022-12-20 ENCOUNTER — Encounter (HOSPITAL_BASED_OUTPATIENT_CLINIC_OR_DEPARTMENT_OTHER): Payer: Self-pay | Admitting: Physical Therapy

## 2022-12-20 DIAGNOSIS — G8929 Other chronic pain: Secondary | ICD-10-CM | POA: Insufficient documentation

## 2022-12-20 DIAGNOSIS — R293 Abnormal posture: Secondary | ICD-10-CM | POA: Insufficient documentation

## 2022-12-20 DIAGNOSIS — M25511 Pain in right shoulder: Secondary | ICD-10-CM | POA: Insufficient documentation

## 2022-12-20 NOTE — Therapy (Signed)
OUTPATIENT PHYSICAL THERAPY Re-EVALUATION   Patient Name: Austin Norris MRN: [id number] DOB:1955/08/04, 67 y.o., male Today's Date: 12/20/2022  END OF SESSION:  PT End of Session - 12/20/22 1047     Visit Number 2    Number of Visits 10    Date for PT Re-Evaluation 02/14/23    Authorization Type [employer] MCR    PT Start Time 1045    PT Stop Time 1119    PT Time Calculation (min) 34 min    Activity Tolerance Patient tolerated treatment well    Behavior During Therapy WFL for tasks assessed/performed              Past Medical History:  Diagnosis Date   Diabetes mellitus    H/O epistaxis    History of stomach ulcers    30 YRS AGO   Hypercholesteremia    Hypertension    Shoulder pain, left    Umbilical hernia    Past Surgical History:  Procedure Laterality Date   CARDIAC CATHETERIZATION  1997   COLONOSCOPY     UMBILICAL HERNIA REPAIR N/A 07/11/2015   Procedure: LAPAROSCOPIC ASSISTED OPEN UMBILICAL HERNIA;  Surgeon: Luretha Murphy, MD;  Location: [facility];  Service: General;  Laterality: N/A;  With MESH   Patient Active Problem List   Diagnosis Date Noted   Delirium    Acute metabolic encephalopathy 03/22/2020   Type II or unspecified type diabetes mellitus without mention of complication, uncontrolled 12/10/2012   Pure hypercholesterolemia 12/10/2012   Unspecified essential hypertension 12/10/2012    REFERRING PROVIDER:  Christena Deem, MD    REFERRING DIAG: M79.601 (ICD-10-CM) - Pain in right arm   Rationale for Evaluation and Treatment: Rehabilitation  THERAPY DIAG:  Chronic right shoulder pain  Abnormal posture  ONSET DATE: 2-3 mo ago   SUBJECTIVE:                                                                                                                                                                                           SUBJECTIVE STATEMENT: Left arm is getting worse. My low back is very tight, maybe that has something to do with it.  Denies doing any of the stretches/exercises.   PERTINENT HISTORY:  none  PAIN:  Are you having pain? Yes: NPRS scale: a little bit/10 Pain location: rubs right shoulder Pain description: achey Aggravating factors: laying down to rest Relieving factors: rubs  PRECAUTIONS:  None  WEIGHT BEARING RESTRICTIONS:  No  FALLS:  Has patient fallen in last 6 months? No  OCCUPATION:  Transport planner of car lot, computer work at desk  PLOF:  Independent  PATIENT GOALS:  sleep   OBJECTIVE:   DIAGNOSTIC FINDINGS:  MRI 04/04/22 IMPRESSION: 1. Distal to the shoulder, no significant findings identified in the right upper arm. 2. Rotator cuff tendinosis with probable high-grade partial tearing of the distal supraspinatus tendon, suboptimally evaluated due to large field of view. Consider dedicated shoulder MRI for further evaluation. 3. Mild glenohumeral and acromioclavicular degenerative changes.  PATIENT SURVEYS:  FOTO 53  COGNITIVE STATUS: Within functional limits for tasks assessed   RED FLAGS: None    POSTURE:  EVAL: incr thoracic kyphosis, bil shoulder fwd rotation 8/24: forward head, incr thoracic kyphosis & bil shoulder fwd rotation  HAND DOMINANCE:  Right    Body Part #1 Shoulder  PALPATION: EVAL: denies TTP through neck/shoulders  Eval: ROM is equal and WFL but reports discomfort at end range flexion on Right side  8/24: GHJ AROM is [facility] but right hikes with flexion and IR behind back; cervical sidebend equal and tight    TREATMENT:                                                                                                                               Treatment                            8/24:  Scap retraction Upper trap & levator stretch Door pec stretch Row blue tband ER red tband Manual: STM Rt upper trap, passive stretching of Rt GHJ flexion & abd   DATE: 6/27 EVAL Seated Scapular Retraction  - 5 x daily - 7 x weekly - 10 reps - Standing  Upper Trapezius Stretch  - 2 x daily - 7 x weekly - 2 sets - 3 breaths hold - Gentle Levator Scapulae Stretch  - 2 x daily - 7 x weekly - 2 sets - 3 breaths hold - Doorway Pec Stretch at 90 Degrees Abduction  - 2 x daily - 7 x weekly - 2 sets - 3 breaths hold  Discussed posture corrector    PATIENT EDUCATION:  Education details: Anatomy of condition, POC, HEP, exercise form/rationale Person educated: Patient Education method: Explanation, Demonstration, Tactile cues, Verbal cues, and Handouts Education comprehension: verbalized understanding, returned demonstration, verbal cues required, tactile cues required, and needs further education  HOME EXERCISE PROGRAM: [phone]   ASSESSMENT:  CLINICAL IMPRESSION: Suspect adhesive capsulitis in right shoulder with ROM compensation patterns and will continue to monitor. Pt denies working on posture/exercises so we reviewed and educated on importance and POC in order to reduce pain. I do feel that his low back pain is connected with postural changes associated with shoulder/UE pain.   Eval:Patient is a 67 y.o. M who was seen today for physical therapy evaluation and treatment for right shoulder pain. He works on a computer for long hours with poor posture but denies that the shoulder is the biggest issue. He will be traveling for about 5  weeks starting this weekend so we will only do 1 visit for shoulder today and f/u when he obtains a script for his lumbar spine.     OBJECTIVE IMPAIRMENTS: postural dysfunction.   ACTIVITY LIMITATIONS:  sleepign  PARTICIPATION LIMITATIONS:  sleep  PERSONAL FACTORS:  work requirements  are also affecting patient's functional outcome.   REHAB POTENTIAL: Good  CLINICAL DECISION MAKING: Stable/uncomplicated  EVALUATION COMPLEXITY: Low   GOALS: Goals reviewed with patient? Yes 1.  Rt GHJ ROM without shoulder hike Baseline:  Goal status: INITIAL. 2.  Pt will verbalize postural corrections throughout his  day Baseline:  Goal status: INITIAL 3.  Independent in long term HEP Baseline:  Goal status: INITIAL 4.  Sleep without limitation by pain Baseline:  Goal status: INITIAL    PLAN:  PT FREQUENCY:  1/week  PT DURATION: other: POC date  PLANNED INTERVENTIONS: Therapeutic exercises and Self Care.  Next visit plan:    Masey Scheiber C. Hoyt Leanos PT, DPT 12/20/22 12:24 PM  Referring diagnosis? M79.601 Treatment diagnosis? (if different than referring diagnosis) R29.3 What was this (referring dx) caused by? []  Surgery []  Fall [x]  Ongoing issue []  Arthritis []  Other: ____________  Laterality: [x]  Rt []  Lt []  Both  Check all possible CPT codes:  *CHOOSE 10 OR LESS*    [x]  97110 (Therapeutic Exercise)  []  92507 (SLP Treatment)  []  97112 (Neuro Re-ed)   []  92526 (Swallowing Treatment)   []  97116 (Gait Training)   []  [id number] (Cognitive Training, 1st 15 minutes) []  97140 (Manual Therapy)   []  97130 (Cognitive Training, each add'l 15 minutes)  []  97164 (Re-evaluation)                              []  Other, List CPT Code ____________  []  97530 (Therapeutic Activities)     [x]  97535 (Self Care)   []  All codes above (97110 - 97535)  []  97012 (Mechanical Traction)  []  97014 (E-stim Unattended)  []  97032 (E-stim manual)  []  21308 (Ionto)  []  97035 (Ultrasound) []  97750 (Physical Performance Training) []  [id number] (Aquatic Therapy) []  97016 (Vasopneumatic Device) []  [id number] (Paraffin) []  97034 (Contrast Bath) []  97597 (Wound Care 1st 20 sq cm) []  97598 (Wound Care each add'l 20 sq cm) []  97760 (Orthotic Fabrication, Fitting, Training Initial) []  [id number] (Prosthetic Management and Training Initial) []  [id number] (Orthotic or Prosthetic Training/ Modification Subsequent)

## 2022-12-21 ENCOUNTER — Other Ambulatory Visit: Payer: Self-pay | Admitting: Endocrinology

## 2022-12-21 DIAGNOSIS — E1165 Type 2 diabetes mellitus with hyperglycemia: Secondary | ICD-10-CM

## 2022-12-23 DIAGNOSIS — H401232 Low-tension glaucoma, bilateral, moderate stage: Secondary | ICD-10-CM | POA: Diagnosis not present

## 2022-12-23 DIAGNOSIS — H2513 Age-related nuclear cataract, bilateral: Secondary | ICD-10-CM | POA: Diagnosis not present

## 2022-12-25 DIAGNOSIS — E119 Type 2 diabetes mellitus without complications: Secondary | ICD-10-CM | POA: Diagnosis not present

## 2022-12-25 DIAGNOSIS — N529 Male erectile dysfunction, unspecified: Secondary | ICD-10-CM | POA: Diagnosis not present

## 2022-12-25 DIAGNOSIS — I10 Essential (primary) hypertension: Secondary | ICD-10-CM | POA: Diagnosis not present

## 2022-12-27 ENCOUNTER — Encounter (HOSPITAL_BASED_OUTPATIENT_CLINIC_OR_DEPARTMENT_OTHER): Payer: Self-pay | Admitting: Physical Therapy

## 2022-12-27 ENCOUNTER — Ambulatory Visit (HOSPITAL_BASED_OUTPATIENT_CLINIC_OR_DEPARTMENT_OTHER): Payer: Medicare PPO | Admitting: Physical Therapy

## 2022-12-27 DIAGNOSIS — G8929 Other chronic pain: Secondary | ICD-10-CM

## 2022-12-27 DIAGNOSIS — R293 Abnormal posture: Secondary | ICD-10-CM | POA: Diagnosis not present

## 2022-12-27 DIAGNOSIS — M25511 Pain in right shoulder: Secondary | ICD-10-CM | POA: Diagnosis not present

## 2022-12-27 NOTE — Therapy (Signed)
OUTPATIENT PHYSICAL THERAPY Re-EVALUATION   Patient Name: Austin Norris MRN: [id number] DOB:1955/06/09, 67 y.o., male Today's Date: 12/27/2022  END OF SESSION:  PT End of Session - 12/27/22 1003     Visit Number 3    Number of Visits 10    Date for PT Re-Evaluation 02/14/23    Authorization Type [employer] MCR    PT Start Time 1001    PT Stop Time 1041    PT Time Calculation (min) 40 min    Activity Tolerance Patient tolerated treatment well    Behavior During Therapy WFL for tasks assessed/performed               Past Medical History:  Diagnosis Date   Diabetes mellitus    H/O epistaxis    History of stomach ulcers    30 YRS AGO   Hypercholesteremia    Hypertension    Shoulder pain, left    Umbilical hernia    Past Surgical History:  Procedure Laterality Date   CARDIAC CATHETERIZATION  1997   COLONOSCOPY     UMBILICAL HERNIA REPAIR N/A 07/11/2015   Procedure: LAPAROSCOPIC ASSISTED OPEN UMBILICAL HERNIA;  Surgeon: Luretha Murphy, MD;  Location: [facility];  Service: General;  Laterality: N/A;  With MESH   Patient Active Problem List   Diagnosis Date Noted   Delirium    Acute metabolic encephalopathy 03/22/2020   Type II or unspecified type diabetes mellitus without mention of complication, uncontrolled 12/10/2012   Pure hypercholesterolemia 12/10/2012   Unspecified essential hypertension 12/10/2012    REFERRING PROVIDER:  Christena Deem, MD    REFERRING DIAG: M79.601 (ICD-10-CM) - Pain in right arm   Rationale for Evaluation and Treatment: Rehabilitation  THERAPY DIAG:  Chronic right shoulder pain  Abnormal posture  ONSET DATE: 2-3 mo ago   SUBJECTIVE:                                                                                                                                                                                           SUBJECTIVE STATEMENT: Shoulder is "eh"  some headache, low back is the same. Did do exercises more.   PERTINENT  HISTORY:  none  PAIN:  Are you having pain? Yes: NPRS scale: a little bit/10 Pain location: rubs right shoulder Pain description: achey Aggravating factors: laying down to rest Relieving factors: rubs  PRECAUTIONS:  None  WEIGHT BEARING RESTRICTIONS:  No  FALLS:  Has patient fallen in last 6 months? No  OCCUPATION:  Transport planner of car lot, computer work at desk  PLOF:  Independent  PATIENT GOALS:  sleep   OBJECTIVE:  DIAGNOSTIC FINDINGS:  MRI 04/04/22 IMPRESSION: 1. Distal to the shoulder, no significant findings identified in the right upper arm. 2. Rotator cuff tendinosis with probable high-grade partial tearing of the distal supraspinatus tendon, suboptimally evaluated due to large field of view. Consider dedicated shoulder MRI for further evaluation. 3. Mild glenohumeral and acromioclavicular degenerative changes.  PATIENT SURVEYS:  FOTO 53  COGNITIVE STATUS: Within functional limits for tasks assessed   RED FLAGS: None    POSTURE:  EVAL: incr thoracic kyphosis, bil shoulder fwd rotation 8/24: forward head, incr thoracic kyphosis & bil shoulder fwd rotation  HAND DOMINANCE:  Right    Body Part #1 Shoulder  PALPATION: EVAL: denies TTP through neck/shoulders  Eval: ROM is equal and WFL but reports discomfort at end range flexion on Right side  8/24: GHJ AROM is [facility] but right hikes with flexion and IR behind back; cervical sidebend equal and tight  8/31 slight right sidebend when performing bil GHJ flexion    TREATMENT:                                                                                                                               Treatment                            8/31:  Trigger Point Dry Needling, Manual Therapy Treatment:  Initial or subsequent education regarding Trigger Point Dry Needling: Initial - education time included in Manual Did patient give consent to treatment with Trigger Point Dry Needling: Yes TPDN with  skilled palpation and monitoring followed by STM to the following muscles: Rt glut med, Lt rhomoid  Prone rib mobs, inf sacral mobs bil Anchored rows green Wall sit, OH reach with pull out on band L stretch Reviewed form in HEP exercises   Treatment                            8/24:  Scap retraction Upper trap & levator stretch Door pec stretch Row blue tband ER red tband Manual: STM Rt upper trap, passive stretching of Rt GHJ flexion & abd   DATE: 6/27 EVAL Seated Scapular Retraction  - 5 x daily - 7 x weekly - 10 reps - Standing Upper Trapezius Stretch  - 2 x daily - 7 x weekly - 2 sets - 3 breaths hold - Gentle Levator Scapulae Stretch  - 2 x daily - 7 x weekly - 2 sets - 3 breaths hold - Doorway Pec Stretch at 90 Degrees Abduction  - 2 x daily - 7 x weekly - 2 sets - 3 breaths hold  Discussed posture corrector    PATIENT EDUCATION:  Education details: Anatomy of condition, POC, HEP, exercise form/rationale Person educated: Patient Education method: Explanation, Demonstration, Tactile cues, Verbal cues, and Handouts Education comprehension: verbalized understanding, returned demonstration, verbal cues required, tactile cues required, and  needs further education  HOME EXERCISE PROGRAM: [phone]   ASSESSMENT:  CLINICAL IMPRESSION: DN to post chain diagonal Rt throacic/Lt lumbar. Pt still tends toward kyphotic posture and requires frequent cuing for proper posture in exercises.    OBJECTIVE IMPAIRMENTS: postural dysfunction.   ACTIVITY LIMITATIONS:  sleepign  PARTICIPATION LIMITATIONS:  sleep  PERSONAL FACTORS:  work requirements  are also affecting patient's functional outcome.   REHAB POTENTIAL: Good  CLINICAL DECISION MAKING: Stable/uncomplicated  EVALUATION COMPLEXITY: Low   GOALS: Goals reviewed with patient? Yes 1.  Rt GHJ ROM without shoulder hike Baseline:  Goal status: INITIAL. 2.  Pt will verbalize postural corrections throughout his  day Baseline:  Goal status: INITIAL 3.  Independent in long term HEP Baseline:  Goal status: INITIAL 4.  Sleep without limitation by pain Baseline:  Goal status: INITIAL    PLAN:  PT FREQUENCY:  1/week  PT DURATION: other: POC date  PLANNED INTERVENTIONS: Therapeutic exercises and Self Care.     Maurisa Tesmer C. Jennetta Flood PT, DPT 12/27/22 10:42 AM  Referring diagnosis? M79.601 Treatment diagnosis? (if different than referring diagnosis) R29.3 What was this (referring dx) caused by? []  Surgery []  Fall [x]  Ongoing issue []  Arthritis []  Other: ____________  Laterality: [x]  Rt []  Lt []  Both  Check all possible CPT codes:  *CHOOSE 10 OR LESS*    [x]  97110 (Therapeutic Exercise)  []  92507 (SLP Treatment)  []  97112 (Neuro Re-ed)   []  92526 (Swallowing Treatment)   []  97116 (Gait Training)   []  [id number] (Cognitive Training, 1st 15 minutes) []  97140 (Manual Therapy)   []  97130 (Cognitive Training, each add'l 15 minutes)  []  97164 (Re-evaluation)                              []  Other, List CPT Code ____________  []  97530 (Therapeutic Activities)     [x]  97535 (Self Care)   []  All codes above (97110 - 97535)  []  97012 (Mechanical Traction)  []  97014 (E-stim Unattended)  []  97032 (E-stim manual)  []  97033 (Ionto)  []  97035 (Ultrasound) []  97750 (Physical Performance Training) []  [id number] (Aquatic Therapy) []  97016 (Vasopneumatic Device) []  [id number] (Paraffin) []  97034 (Contrast Bath) []  97597 (Wound Care 1st 20 sq cm) []  97598 (Wound Care each add'l 20 sq cm) []  44034 (Orthotic Fabrication, Fitting, Training Initial) []  [id number] (Prosthetic Management and Training Initial) []  [id number] (Orthotic or Prosthetic Training/ Modification Subsequent)

## 2023-01-01 DIAGNOSIS — R29898 Other symptoms and signs involving the musculoskeletal system: Secondary | ICD-10-CM | POA: Diagnosis not present

## 2023-01-01 DIAGNOSIS — S39012A Strain of muscle, fascia and tendon of lower back, initial encounter: Secondary | ICD-10-CM | POA: Diagnosis not present

## 2023-01-03 ENCOUNTER — Ambulatory Visit (HOSPITAL_BASED_OUTPATIENT_CLINIC_OR_DEPARTMENT_OTHER): Payer: Medicare PPO | Attending: Sports Medicine

## 2023-01-03 ENCOUNTER — Encounter (HOSPITAL_BASED_OUTPATIENT_CLINIC_OR_DEPARTMENT_OTHER): Payer: Self-pay

## 2023-01-03 DIAGNOSIS — G8929 Other chronic pain: Secondary | ICD-10-CM | POA: Diagnosis not present

## 2023-01-03 DIAGNOSIS — M25511 Pain in right shoulder: Secondary | ICD-10-CM | POA: Insufficient documentation

## 2023-01-03 DIAGNOSIS — R293 Abnormal posture: Secondary | ICD-10-CM | POA: Insufficient documentation

## 2023-01-03 NOTE — Therapy (Signed)
OUTPATIENT PHYSICAL THERAPY    Patient Name: Austin Norris MRN: [id number] DOB:[birth year], 67 y.o., male Today's Date: 01/03/2023  END OF SESSION:  PT End of Session - 01/03/23 1113     Visit Number 4    Number of Visits 10    Date for PT Re-Evaluation 02/14/23    Authorization Type [employer] MCR    PT Start Time 1049    PT Stop Time 1129    PT Time Calculation (min) 40 min    Activity Tolerance Patient tolerated treatment well    Behavior During Therapy WFL for tasks assessed/performed                Past Medical History:  Diagnosis Date   Diabetes mellitus    H/O epistaxis    History of stomach ulcers    30 YRS AGO   Hypercholesteremia    Hypertension    Shoulder pain, left    Umbilical hernia    Past Surgical History:  Procedure Laterality Date   CARDIAC CATHETERIZATION  1997   COLONOSCOPY     UMBILICAL HERNIA REPAIR N/A 07/11/2015   Procedure: LAPAROSCOPIC ASSISTED OPEN UMBILICAL HERNIA;  Surgeon: Luretha Murphy, MD;  Location: [facility];  Service: General;  Laterality: N/A;  With MESH   Patient Active Problem List   Diagnosis Date Noted   Delirium    Acute metabolic encephalopathy 03/22/2020   Type II or unspecified type diabetes mellitus without mention of complication, uncontrolled 12/10/2012   Pure hypercholesterolemia 12/10/2012   Unspecified essential hypertension 12/10/2012    REFERRING PROVIDER:  Christena Deem, MD    REFERRING DIAG: M79.601 (ICD-10-CM) - Pain in right arm   Rationale for Evaluation and Treatment: Rehabilitation  THERAPY DIAG:  Chronic right shoulder pain  Abnormal posture  ONSET DATE: 2-3 mo ago   SUBJECTIVE:                                                                                                                                                                                           SUBJECTIVE STATEMENT: Pt reports relief following last session for 4-5 days. He felt benefit from DN. Reports tightness at  entry over left side of low back.   PERTINENT HISTORY:  none  PAIN:  Are you having pain? Yes: NPRS scale: a little bit/10 Pain location: rubs right shoulder Pain description: achey Aggravating factors: laying down to rest Relieving factors: rubs  PRECAUTIONS:  None  WEIGHT BEARING RESTRICTIONS:  No  FALLS:  Has patient fallen in last 6 months? No  OCCUPATION:  Transport planner of car lot, computer work at desk  PLOF:  Independent  PATIENT GOALS:  sleep   OBJECTIVE:   DIAGNOSTIC FINDINGS:  MRI 04/04/22 IMPRESSION: 1. Distal to the shoulder, no significant findings identified in the right upper arm. 2. Rotator cuff tendinosis with probable high-grade partial tearing of the distal supraspinatus tendon, suboptimally evaluated due to large field of view. Consider dedicated shoulder MRI for further evaluation. 3. Mild glenohumeral and acromioclavicular degenerative changes.  PATIENT SURVEYS:  FOTO 53  COGNITIVE STATUS: Within functional limits for tasks assessed   RED FLAGS: None    POSTURE:  EVAL: incr thoracic kyphosis, bil shoulder fwd rotation 8/24: forward head, incr thoracic kyphosis & bil shoulder fwd rotation  HAND DOMINANCE:  Right    Body Part #1 Shoulder  PALPATION: EVAL: denies TTP through neck/shoulders  Eval: ROM is equal and WFL but reports discomfort at end range flexion on Right side  8/24: GHJ AROM is [facility] but right hikes with flexion and IR behind back; cervical sidebend equal and tight  8/31 slight right sidebend when performing bil GHJ flexion    TREATMENT:                                                                                                                               Treatment                            9/7: Upper trap & levator stretch 30sec ea/bil Seated chin tuck- 3sec x10 L wrist flex/ext stretching Door way stretch L only 15sec x2 Rows/ext Blue TB x20ea- Cues for posture Child's pose stretch 20sec  x3 Cat/cow in quadruped 3sec hold x10  Piriformis stretch- 30sec x2 ea Hooklying marches with trA 2x10ea Bridges 2x10 HEP update- piriformis, child's pose, wrist flexor stretch  Treatment                            8/31:  Trigger Point Dry Needling, Manual Therapy Treatment:  Initial or subsequent education regarding Trigger Point Dry Needling: Initial - education time included in Manual Did patient give consent to treatment with Trigger Point Dry Needling: Yes TPDN with skilled palpation and monitoring followed by STM to the following muscles: Rt glut med, Lt rhomoid  Prone rib mobs, inf sacral mobs bil Anchored rows green Wall sit, OH reach with pull out on band L stretch Reviewed form in HEP exercises   Treatment                            8/24:  Scap retraction Upper trap & levator stretch Door pec stretch Row blue tband ER red tband Manual: STM Rt upper trap, passive stretching of Rt GHJ flexion & abd   DATE: 6/27 EVAL Seated Scapular Retraction  - 5 x daily - 7 x weekly - 10 reps - Standing Upper Trapezius Stretch  - 2 x daily -  7 x weekly - 2 sets - 3 breaths hold - Gentle Levator Scapulae Stretch  - 2 x daily - 7 x weekly - 2 sets - 3 breaths hold - Doorway Pec Stretch at 90 Degrees Abduction  - 2 x daily - 7 x weekly - 2 sets - 3 breaths hold  Discussed posture corrector    PATIENT EDUCATION:  Education details: Anatomy of condition, POC, HEP, exercise form/rationale Person educated: Patient Education method: Explanation, Demonstration, Tactile cues, Verbal cues, and Handouts Education comprehension: verbalized understanding, returned demonstration, verbal cues required, tactile cues required, and needs further education  HOME EXERCISE PROGRAM: [phone]   ASSESSMENT:  CLINICAL IMPRESSION: Pt c/o tightness in anterior forearm and biceps, so instructed in stretches to improve this. He had good carry over of verbal cues and visual demonstration with resisted  row/ext, but did require occasional cue for cervical position. Benefited from low back and hip stretching today. No complaints of pain with bridge exercise. Educated pt again on importance of postural awareness through his daily activities. Provided updated HEP to include additional stretches for UE and low back/hips. Will continue to progress with strengthening as tolerated.    OBJECTIVE IMPAIRMENTS: postural dysfunction.   ACTIVITY LIMITATIONS:  sleepign  PARTICIPATION LIMITATIONS:  sleep  PERSONAL FACTORS:  work requirements  are also affecting patient's functional outcome.   REHAB POTENTIAL: Good  CLINICAL DECISION MAKING: Stable/uncomplicated  EVALUATION COMPLEXITY: Low   GOALS: Goals reviewed with patient? Yes 1.  Rt GHJ ROM without shoulder hike Baseline:  Goal status: INITIAL. 2.  Pt will verbalize postural corrections throughout his day Baseline:  Goal status: INITIAL 3.  Independent in long term HEP Baseline:  Goal status: INITIAL 4.  Sleep without limitation by pain Baseline:  Goal status: INITIAL    PLAN:  PT FREQUENCY:  1/week  PT DURATION: other: POC date  PLANNED INTERVENTIONS: Therapeutic exercises and Self Care.     Riki Altes, PTA  01/03/23 11:36 AM  Referring diagnosis? M79.601 Treatment diagnosis? (if different than referring diagnosis) R29.3 What was this (referring dx) caused by? []  Surgery []  Fall [x]  Ongoing issue []  Arthritis []  Other: ____________  Laterality: [x]  Rt []  Lt []  Both  Check all possible CPT codes:  *CHOOSE 10 OR LESS*    [x]  97110 (Therapeutic Exercise)  []  92507 (SLP Treatment)  []  97112 (Neuro Re-ed)   []  54098 (Swallowing Treatment)   []  11914 (Gait Training)   []  [id number] (Cognitive Training, 1st 15 minutes) []  97140 (Manual Therapy)   []  97130 (Cognitive Training, each add'l 15 minutes)  []  97164 (Re-evaluation)                              []  Other, List CPT Code ____________  []  97530 (Therapeutic  Activities)     [x]  97535 (Self Care)   []  All codes above (97110 - 97535)  []  97012 (Mechanical Traction)  []  97014 (E-stim Unattended)  []  97032 (E-stim manual)  []  97033 (Ionto)  []  97035 (Ultrasound) []  97750 (Physical Performance Training) []  [id number] (Aquatic Therapy) []  97016 (Vasopneumatic Device) []  [id number] (Paraffin) []  97034 (Contrast Bath) []  97597 (Wound Care 1st 20 sq cm) []  97598 (Wound Care each add'l 20 sq cm) []  97760 (Orthotic Fabrication, Fitting, Training Initial) []  [id number] (Prosthetic Management and Training Initial) []  [id number] (Orthotic or Prosthetic Training/ Modification Subsequent)

## 2023-01-06 ENCOUNTER — Other Ambulatory Visit: Payer: Medicare PPO

## 2023-01-06 ENCOUNTER — Other Ambulatory Visit (INDEPENDENT_AMBULATORY_CARE_PROVIDER_SITE_OTHER): Payer: Medicare PPO

## 2023-01-06 DIAGNOSIS — Z794 Long term (current) use of insulin: Secondary | ICD-10-CM | POA: Diagnosis not present

## 2023-01-06 DIAGNOSIS — E1165 Type 2 diabetes mellitus with hyperglycemia: Secondary | ICD-10-CM | POA: Diagnosis not present

## 2023-01-06 DIAGNOSIS — E7800 Pure hypercholesterolemia, unspecified: Secondary | ICD-10-CM | POA: Diagnosis not present

## 2023-01-06 LAB — LIPID PANEL
Cholesterol: 142 mg/dL (ref 0–200)
HDL: 47.7 mg/dL (ref >39.00)
LDL Cholesterol: 69 mg/dL (ref 0–99)
NonHDL: 94.17
Total CHOL/HDL Ratio: 3
Triglycerides: 126 mg/dL (ref 0.0–149.0)
VLDL: 25.2 mg/dL (ref 0.0–40.0)

## 2023-01-06 LAB — COMPREHENSIVE METABOLIC PANEL
ALT: 18 U/L (ref 0–53)
AST: 21 U/L (ref 0–37)
Albumin: 4.2 g/dL (ref 3.5–5.2)
Alkaline Phosphatase: 93 U/L (ref 39–117)
BUN: 9 mg/dL (ref 6–23)
CO2: 24 meq/L (ref 19–32)
Calcium: 9.4 mg/dL (ref 8.4–10.5)
Chloride: 105 meq/L (ref 96–112)
Creatinine, Ser: 0.66 mg/dL (ref 0.40–1.50)
GFR: 96.8 mL/min (ref >60.00)
Glucose, Bld: 122 mg/dL — ABNORMAL HIGH (ref 70–99)
Potassium: 4.1 meq/L (ref 3.5–5.1)
Sodium: 138 meq/L (ref 135–145)
Total Bilirubin: 0.4 mg/dL (ref 0.2–1.2)
Total Protein: 6.9 g/dL (ref 6.0–8.3)

## 2023-01-06 LAB — HEMOGLOBIN A1C: Hgb A1c MFr Bld: 7.1 % — ABNORMAL HIGH (ref 4.6–6.5)

## 2023-01-13 ENCOUNTER — Encounter: Payer: Self-pay | Admitting: Endocrinology

## 2023-01-13 ENCOUNTER — Ambulatory Visit: Payer: Medicare PPO | Admitting: Endocrinology

## 2023-01-13 VITALS — BP 130/60 | HR 68 | Ht 63.0 in | Wt 185.8 lb

## 2023-01-13 DIAGNOSIS — E1140 Type 2 diabetes mellitus with diabetic neuropathy, unspecified: Secondary | ICD-10-CM | POA: Diagnosis not present

## 2023-01-13 DIAGNOSIS — Z794 Long term (current) use of insulin: Secondary | ICD-10-CM | POA: Diagnosis not present

## 2023-01-13 DIAGNOSIS — E118 Type 2 diabetes mellitus with unspecified complications: Secondary | ICD-10-CM

## 2023-01-13 MED ORDER — SEMAGLUTIDE (1 MG/DOSE) 4 MG/3ML ~~LOC~~ SOPN: 1.0000 mg | PEN_INJECTOR | SUBCUTANEOUS | 3 refills | Status: DC

## 2023-01-13 MED ORDER — METFORMIN HCL ER 500 MG PO TB24
1000.0000 mg | ORAL_TABLET | Freq: Two times a day (BID) | ORAL | 4 refills | Status: DC
Start: 2023-01-13 — End: 2023-07-27

## 2023-01-13 MED ORDER — TRESIBA FLEXTOUCH 200 UNIT/ML ~~LOC~~ SOPN
30.0000 [IU] | PEN_INJECTOR | Freq: Every day | SUBCUTANEOUS | 2 refills | Status: DC
Start: 2023-01-13 — End: 2023-09-28

## 2023-01-13 MED ORDER — EMPAGLIFLOZIN 25 MG PO TABS: 25.0000 mg | ORAL_TABLET | Freq: Every day | ORAL | 3 refills | Status: DC

## 2023-01-13 NOTE — Patient Instructions (Addendum)
Diabetes regimen: Decrease Tresiba to 30 units daily. Informant increase metformin 1000 mg extended release 2 times a day with meals.   Continue Jardiance 25 mg daily prescription sent. Continue Ozempic 1 mg weekly prescription sent.

## 2023-01-13 NOTE — Progress Notes (Signed)
Outpatient Endocrinology Note Austin Pedro Whiters, MD  01/13/23  Patient's Name: Austin Norris    DOB: November 16, 1955    MRN: [id number]                                                    REASON OF VISIT: Follow-up of type 2 diabetes mellitus  PCP: Lorenda Ishihara, MD  HISTORY OF PRESENT ILLNESS:   Austin Norris is a 67 y.o. old male with past medical history listed below, is here for follow up of type 2 diabetes mellitus.   Pertinent Diabetes History: Patient was diagnosed with type 2 diabetes mellitus several years ago and has been on insulin therapy for several years.  He has fair control of type 2 diabetes mellitus.  Chronic Diabetes Complications : Retinopathy: no. Last ophthalmology exam was done on annually reportedly. Nephropathy: no Peripheral neuropathy: yes, burning pain in lower legs.  On gabapentin. Coronary artery disease: yes Stroke: no  Relevant comorbidities and cardiovascular risk factors: Obesity: yes Body mass index is 32.91 kg/m.  Hypertension: yes Hyperlipidemia. Yes, on statin.  Current / Home Diabetic regimen includes:  Metformin XR 500 mg AM and 1000 mg in PM Tresiba 32 units at bedtime.  Ozempic 1 mg weekly.  Jardiance 25 mg daily. No longer requiring NovoLog has not taken for 2 months.  Prior diabetic medications: Trulicity, Ozempic. Marcelline Deist, cost was expensive.  Glycemic data:    CONTINUOUS GLUCOSE MONITORING SYSTEM (CGMS) INTERPRETATION: At today's visit, we reviewed CGM downloads. The full report is scanned in the media. Reviewing the CGM trends, blood glucose are as follows:  FreeStyle Libre 3 CGM-  Sensor Download (Sensor download was reviewed and summarized below.) Dates: August 21 to January 13, 2023 for 28 days. Sensor Average: 135  Glucose Management Indicator: 6.5% Glucose Variability: 18.3%  % data captured: 99%  Glycemic Trends:  <54: 0% 54-70: 0% 71-180: 96% 181-250: 4% 251-400: 0%   Interpretation: -Mostly  acceptable blood sugar with occasional postprandial mild hyperglycemia with blood sugar up to 1 80-200 range.  No hypoglycemia.  96% blood sugar in the range of 71-180.  Hypoglycemia: Patient has no hypoglycemic episodes. Patient has hypoglycemia awareness.  Factors modifying glucose control: 1.  Diabetic diet assessment: 2-3 meals a day.  2.  Staying active or exercising: No formal exercise.  3.  Medication compliance: compliant all of the time.  # Erectile dysfunction: Tried Viagra did not work.  He is on Cialis however he eventually stopped not working.  He was following with PCP in the past.  He is going to see urology in the near future.  Interval history 01/13/23 He has been taking Ozempic 1 mg weekly, gradually increased from 0.25 mg.  He has been getting Ozempic from [place].  He has been taking [place] 32 units daily, metformin, Ozempic and Jardiance as mentioned above.  He mentioned he has also been taking Jardiance.  He has not been requiring NovoLog has not taken for about 2 months.  When he took NovoLog with Ozempic he had hypoglycemia and is stopped taking NovoLog.  Recent labs reviewed.  Hemoglobin A1c 7.1%.  Cholesterol levels LDL 69.  Normal electrolytes and renal function.  REVIEW OF SYSTEMS As per history of present illness.   PAST MEDICAL HISTORY: Past Medical History:  Diagnosis Date   Diabetes mellitus  H/O epistaxis    History of stomach ulcers    30 YRS AGO   Hypercholesteremia    Hypertension    Shoulder pain, left    Umbilical hernia     PAST SURGICAL HISTORY: Past Surgical History:  Procedure Laterality Date   CARDIAC CATHETERIZATION  1997   COLONOSCOPY     UMBILICAL HERNIA REPAIR N/A 07/11/2015   Procedure: LAPAROSCOPIC ASSISTED OPEN UMBILICAL HERNIA;  Surgeon: Luretha Murphy, MD;  Location: [facility];  Service: General;  Laterality: N/A;  With MESH    ALLERGIES: No Known Allergies  FAMILY HISTORY:  Family History  Problem Relation Age  of Onset   Heart disease Mother    Diabetes Father    Diabetes Sister     SOCIAL HISTORY: Social History   Socioeconomic History   Marital status: Married    Spouse name: Not on file   Number of children: 5   Years of education: Not on file   Highest education level: Not on file  Occupational History   Occupation: Investment banker, corporate: [employer]  Tobacco Use   Smoking status: Former    Current packs/day: 0.00    Average packs/day: 1 pack/day for 10.0 years (10.0 ttl pk-yrs)    Types: Cigarettes    Start date: 05/22/1987    Quit date: 05/21/1997    Years since quitting: 25.6   Smokeless tobacco: Never  Vaping Use   Vaping status: Never Used  Substance and Sexual Activity   Alcohol use: No   Drug use: No   Sexual activity: Not on file  Other Topics Concern   Not on file  Social History Narrative   Not on file   Social Determinants of Health   Financial Resource Strain: Not on file  Food Insecurity: Not on file  Transportation Needs: Not on file  Physical Activity: Not on file  Stress: Not on file  Social Connections: Not on file    MEDICATIONS:  Current Outpatient Medications  Medication Sig Dispense Refill   Alcohol Swabs 70 % PADS Use as directed 300 each 1   amLODipine-olmesartan (AZOR) 5-40 MG tablet TAKE 1 TABLET BY MOUTH DAILY AT 10 PM. 90 tablet 0   aspirin 81 MG chewable tablet Chew 1 tablet (81 mg total) by mouth daily. 90 tablet 1   carvedilol (COREG) 12.5 MG tablet TAKE 1 TABLET TWICE DAILY 180 tablet 3   Continuous Blood Gluc Sensor (FREESTYLE LIBRE 3 SENSOR) MISC Place 1 sensor on the skin every 14 days. Use to check glucose continuously 6 each 2   empagliflozin (JARDIANCE) 25 MG TABS tablet Take 1 tablet (25 mg total) by mouth daily before breakfast. 90 tablet 3   Insulin Syringe-Needle U-100 (INSULIN SYRINGE 1CC/31GX5/16") 31G X 5/16" 1 ML MISC Use to inject lantus daily 90 each 1   latanoprost (XALATAN) 0.005 % ophthalmic solution SMARTSIG:In Eye(s)      MICROLET LANCETS MISC Use to check blood sugar 2 times a day 200 each 1   omeprazole (PRILOSEC) 40 MG capsule Take 40 mg by mouth daily.     rosuvastatin (CRESTOR) 5 MG tablet Take 1 tablet (5 mg total) by mouth daily. 90 tablet 3   Semaglutide, 1 MG/DOSE, (OZEMPIC, 1 MG/DOSE,) 2 MG/1.5ML SOPN Inject 1 mg into the skin once a week.     Semaglutide, 1 MG/DOSE, 4 MG/3ML SOPN Inject 1 mg as directed once a week. 6 mL 3   tadalafil (CIALIS) 10 MG tablet Use as needed  10 tablet 2   gabapentin (NEURONTIN) 300 MG capsule TAKE 1 CAPSULE BY MOUTH THREE TIMES A DAY AS NEEDED 270 capsule 1   glucose blood (CONTOUR NEXT TEST) test strip USE TO CHECK BLOOD SUGAR TWICE A DAY (Patient not taking: Reported on 04/24/2022) 100 strip 2   insulin aspart (NOVOLOG FLEXPEN) 100 UNIT/ML FlexPen INJECT 35 UNITS INTO THE SKIN IN THE MORNING, AT NOON, AND AT BEDTIME. (Patient not taking: Reported on 01/13/2023) 45 mL 1   insulin degludec (TRESIBA FLEXTOUCH) 200 UNIT/ML FlexTouch Pen Inject 30 Units into the skin at bedtime. 18 mL 2   metFORMIN (GLUCOPHAGE-XR) 500 MG 24 hr tablet Take 2 tablets (1,000 mg total) by mouth 2 (two) times daily with a meal. 180 tablet 4   spironolactone (ALDACTONE) 25 MG tablet Take 25 mg by mouth daily. (Patient not taking: Reported on 01/13/2023)     No current facility-administered medications for this visit.    PHYSICAL EXAM: Vitals:   01/13/23 1426  BP: 130/60  Pulse: 68  SpO2: 99%  Weight: 185 lb 12.8 oz (84.3 kg)  Height: 5\' 3"  (1.6 m)   Body mass index is 32.91 kg/m.  Wt Readings from Last 3 Encounters:  01/13/23 185 lb 12.8 oz (84.3 kg)  09/25/22 190 lb 6.4 oz (86.4 kg)  04/24/22 179 lb 9.6 oz (81.5 kg)    General: Well developed, well nourished male in no apparent distress.  HEENT: AT/[place], no external lesions.  Eyes: Conjunctiva clear and no icterus. Neck: Neck supple  Lungs: Respirations not labored Neurologic: Alert, oriented, normal speech Extremities / Skin: Dry.  No sores or rashes noted. No acanthosis nigricans Psychiatric: Does not appear depressed or anxious  Diabetic Foot Exam - Simple   No data filed    LABS Reviewed Lab Results  Component Value Date   HGBA1C 7.1 (H) 01/06/2023   HGBA1C 7.2 08/19/2022   HGBA1C 7.3 (H) 04/18/2022   Lab Results  Component Value Date   FRUCTOSAMINE 298 (H) 09/26/2020   Lab Results  Component Value Date   CHOL 142 01/06/2023   HDL 47.70 01/06/2023   LDLCALC 69 01/06/2023   LDLDIRECT 85 04/09/2022   TRIG 126.0 01/06/2023   CHOLHDL 3 01/06/2023   Lab Results  Component Value Date   MICRALBCREAT NOTE 04/18/2022   MICRALBCREAT 0.9 06/26/2021   Lab Results  Component Value Date   CREATININE 0.66 01/06/2023   Lab Results  Component Value Date   GFR 96.80 01/06/2023    ASSESSMENT / PLAN  1. Type 2 diabetes mellitus with diabetic neuropathy, with long-term current use of insulin (HCC)   2. Controlled type 2 diabetes mellitus with complication, with long-term current use of insulin (HCC)     Diabetes Mellitus type 2, complicated by diabetic neuropathy. - Diabetic status / severity: Controlled.  Lab Results  Component Value Date   HGBA1C 7.1 (H) 01/06/2023    - Hemoglobin A1c goal : <7%  He has relatively controlled diabetes mellitus.  CGM data as reviewed above and mostly acceptable blood sugar.  - Medications: See below. Diabetes regimen: Decrease Tresiba to 30 units daily. Informant increase metformin 1000 mg extended release 2 times a day with meals.   Continue Jardiance 25 mg daily prescription sent. Continue Ozempic 1 mg weekly prescription sent.  Not using NovoLog for about 2 months.  Stay off of NovoLog.  He requested to send prescription for Jardiance and Ozempic and if it is not covered and expensive, he will try to  get them from [place].   - Home glucose testing: Freestyle libre 3 CGM and check as needed. - Discussed/ Gave Hypoglycemia treatment plan.  # Consult  : not required at this time.   # Annual urine for microalbuminuria/ creatinine ratio, no microalbuminuria currently. Last  Lab Results  Component Value Date   MICRALBCREAT NOTE 04/18/2022    # Foot check nightly / neuropathy, continue gabapentin.  # Annual dilated diabetic eye exams.   - Diet: Make healthy diabetic food choices - Life style / activity / exercise: Discussed.  2. Blood pressure  -  BP Readings from Last 1 Encounters:  01/13/23 130/60    - Control is in target.  - No change in current plans.  3. Lipid status / Hyperlipidemia - Last  Lab Results  Component Value Date   LDLCALC 69 01/06/2023   - Continue Crestor 5 mg daily.  Diagnoses and all orders for this visit:  Type 2 diabetes mellitus with diabetic neuropathy, with long-term current use of insulin (HCC)  Controlled type 2 diabetes mellitus with complication, with long-term current use of insulin (HCC) -     metFORMIN (GLUCOPHAGE-XR) 500 MG 24 hr tablet; Take 2 tablets (1,000 mg total) by mouth 2 (two) times daily with a meal. -     insulin degludec (TRESIBA FLEXTOUCH) 200 UNIT/ML FlexTouch Pen; Inject 30 Units into the skin at bedtime.  Other orders -     empagliflozin (JARDIANCE) 25 MG TABS tablet; Take 1 tablet (25 mg total) by mouth daily before breakfast. -     Semaglutide, 1 MG/DOSE, 4 MG/3ML SOPN; Inject 1 mg as directed once a week.    DISPOSITION Follow up in clinic in 4  months suggested.   All questions answered and patient verbalized understanding of the plan.  Austin Liz Pinho, MD [facility] [address] [place], [address] [place], [place] [address] Phone # [phone]  At least part of this note was generated using voice recognition software. Inadvertent word errors may have occurred, which were not recognized during the proofreading process.

## 2023-01-14 ENCOUNTER — Encounter: Payer: Self-pay | Admitting: Endocrinology

## 2023-01-16 ENCOUNTER — Other Ambulatory Visit: Payer: Self-pay | Admitting: Cardiology

## 2023-01-16 DIAGNOSIS — I10 Essential (primary) hypertension: Secondary | ICD-10-CM

## 2023-01-17 ENCOUNTER — Ambulatory Visit (HOSPITAL_BASED_OUTPATIENT_CLINIC_OR_DEPARTMENT_OTHER): Payer: Medicare PPO | Admitting: Physical Therapy

## 2023-01-17 ENCOUNTER — Encounter (HOSPITAL_BASED_OUTPATIENT_CLINIC_OR_DEPARTMENT_OTHER): Payer: Self-pay | Admitting: Physical Therapy

## 2023-01-17 DIAGNOSIS — R293 Abnormal posture: Secondary | ICD-10-CM | POA: Diagnosis not present

## 2023-01-17 DIAGNOSIS — G8929 Other chronic pain: Secondary | ICD-10-CM | POA: Diagnosis not present

## 2023-01-17 DIAGNOSIS — M25511 Pain in right shoulder: Secondary | ICD-10-CM | POA: Diagnosis not present

## 2023-01-17 NOTE — Therapy (Signed)
OUTPATIENT PHYSICAL THERAPY    Patient Name: Austin Norris MRN: [id number] DOB:06-05-1955, 67 y.o., male Today's Date: 01/17/2023  END OF SESSION:  PT End of Session - 01/17/23 1051     Visit Number 5    Number of Visits 10    Date for PT Re-Evaluation 02/14/23    Authorization Type [employer] MCR    PT Start Time 1049    PT Stop Time 1130    PT Time Calculation (min) 41 min    Activity Tolerance Patient tolerated treatment well    Behavior During Therapy WFL for tasks assessed/performed                Past Medical History:  Diagnosis Date   Diabetes mellitus    H/O epistaxis    History of stomach ulcers    30 YRS AGO   Hypercholesteremia    Hypertension    Shoulder pain, left    Umbilical hernia    Past Surgical History:  Procedure Laterality Date   CARDIAC CATHETERIZATION  1997   COLONOSCOPY     UMBILICAL HERNIA REPAIR N/A 07/11/2015   Procedure: LAPAROSCOPIC ASSISTED OPEN UMBILICAL HERNIA;  Surgeon: Luretha Murphy, MD;  Location: [facility];  Service: General;  Laterality: N/A;  With MESH   Patient Active Problem List   Diagnosis Date Noted   Delirium    Acute metabolic encephalopathy 03/22/2020   Type II or unspecified type diabetes mellitus without mention of complication, uncontrolled 12/10/2012   Pure hypercholesterolemia 12/10/2012   Unspecified essential hypertension 12/10/2012    REFERRING PROVIDER:  Christena Deem, MD    REFERRING DIAG: M79.601 (ICD-10-CM) - Pain in right arm   Rationale for Evaluation and Treatment: Rehabilitation  THERAPY DIAG:  Chronic right shoulder pain  Abnormal posture  ONSET DATE: 2-3 mo ago   SUBJECTIVE:                                                                                                                                                                                           SUBJECTIVE STATEMENT: Pt reports continued pain in shoulders at night when he tries to sleep, up to 8/10.   Not much pain  during day.  He reports that his back is feeling better.  He reports that he has been regularly performing row exercise with blue band and single knee to chest stretch.  He tried the wrist stretch but he says "he feels it" in his bicep area.   PERTINENT HISTORY:  none  PAIN:  Are you having pain? no: NPRS scale: 0/10 Pain location: deltoid area bilat Pain description: achey at night Aggravating factors:  laying down to rest Relieving factors: rubs  PRECAUTIONS:  None  WEIGHT BEARING RESTRICTIONS:  No  FALLS:  Has patient fallen in last 6 months? No  OCCUPATION:  Transport planner of car lot, computer work at desk  PLOF:  Independent  PATIENT GOALS:  sleep   OBJECTIVE:   DIAGNOSTIC FINDINGS:  MRI 04/04/22 IMPRESSION: 1. Distal to the shoulder, no significant findings identified in the right upper arm. 2. Rotator cuff tendinosis with probable high-grade partial tearing of the distal supraspinatus tendon, suboptimally evaluated due to large field of view. Consider dedicated shoulder MRI for further evaluation. 3. Mild glenohumeral and acromioclavicular degenerative changes.  PATIENT SURVEYS:  FOTO 53  COGNITIVE STATUS: Within functional limits for tasks assessed   RED FLAGS: None    POSTURE:  EVAL: incr thoracic kyphosis, bil shoulder fwd rotation 8/24: forward head, incr thoracic kyphosis & bil shoulder fwd rotation  HAND DOMINANCE:  Right    Body Part #1 Shoulder  PALPATION: EVAL: denies TTP through neck/shoulders  Eval: ROM is equal and WFL but reports discomfort at end range flexion on Right side  8/24: GHJ AROM is [facility] but right hikes with flexion and IR behind back; cervical sidebend equal and tight  8/31 slight right sidebend when performing bil GHJ flexion    TREATMENT:                                                                                                                               Treatment                            9/21: * single  knee to chest x 15s each * reviewed form with wrist stretch, cues to lower arm and relax upper trap * hooklying: 2 sets of 10 of each, with green band: sash (D2 flexion), bilat horiz abdct, bilat overhead reach "touch down" in painfree range, and bilat shoulder ER in painfree range * open book x 5 reps each side  * cat/cow x 5 reps, wag the tail x 5 * single arm doorway stretch (~110 deg abdct) x 10s x 2 each side; cues for form   Treatment                            9/7: Upper trap & levator stretch 30sec ea/bil Seated chin tuck- 3sec x10 L wrist flex/ext stretching Door way stretch L only 15sec x2 Rows/ext Blue TB x20ea- Cues for posture Child's pose stretch 20sec x3 Cat/cow in quadruped 3sec hold x10  Piriformis stretch- 30sec x2 ea Hooklying marches with trA 2x10ea Bridges 2x10 HEP update- piriformis, child's pose, wrist flexor stretch  Treatment                            8/31:  Trigger Point Dry Needling, Manual Therapy  Treatment:  Initial or subsequent education regarding Trigger Point Dry Needling: Initial - education time included in Manual Did patient give consent to treatment with Trigger Point Dry Needling: Yes TPDN with skilled palpation and monitoring followed by STM to the following muscles: Rt glut med, Lt rhomoid  Prone rib mobs, inf sacral mobs bil Anchored rows green Wall sit, OH reach with pull out on band L stretch Reviewed form in HEP exercises   Treatment                            8/24:  Scap retraction Upper trap & levator stretch Door pec stretch Row blue tband ER red tband Manual: STM Rt upper trap, passive stretching of Rt GHJ flexion & abd   DATE: 6/27 EVAL Seated Scapular Retraction  - 5 x daily - 7 x weekly - 10 reps - Standing Upper Trapezius Stretch  - 2 x daily - 7 x weekly - 2 sets - 3 breaths hold - Gentle Levator Scapulae Stretch  - 2 x daily - 7 x weekly - 2 sets - 3 breaths hold - Doorway Pec Stretch at 90 Degrees Abduction  - 2 x  daily - 7 x weekly - 2 sets - 3 breaths hold  Discussed posture corrector    PATIENT EDUCATION:  Education details: HEP, exercise form/rationale Person educated: Patient Education method: Explanation, Demonstration, Tactile cues, Verbal cues, and Handouts Education comprehension: verbalized understanding, returned demonstration, verbal cues required, tactile cues required, and needs further education  HOME EXERCISE PROGRAM: [phone]  Access Code: [email or web address] URL: [email or web address] Date: 01/17/2023  - exercises with band to be completed in hooklying position, switched doorway stretch to one arm at a time. Issued green band today.   ASSESSMENT:  CLINICAL IMPRESSION: Re-educated pt on importance of upright posture with phone use (vs forward head posture). Encouraged pt to trial heat on neck/ shoulders prior to bed along with open book stretch.  Provided updated HEP and green band for supine postural strengthening exercises (handout shows standing, but was told to complete them in supine).  Pt reported good stretch with open book exercise.  He requires minor cues throughout to breathe and to slow his speed of movement.   Will continue to progress with strengthening as tolerated. Pt making gradual progress towards goals.    OBJECTIVE IMPAIRMENTS: postural dysfunction.   ACTIVITY LIMITATIONS:  sleepign  PARTICIPATION LIMITATIONS:  sleep  PERSONAL FACTORS:  work requirements  are also affecting patient's functional outcome.   REHAB POTENTIAL: Good  CLINICAL DECISION MAKING: Stable/uncomplicated  EVALUATION COMPLEXITY: Low   GOALS: Goals reviewed with patient? Yes 1.  Rt GHJ ROM without shoulder hike Baseline:  Goal status: INITIAL. 2.  Pt will verbalize postural corrections throughout his day Baseline:  Goal status:In progress -01/17/23 3.  Independent in long term HEP Baseline:  Goal status: INITIAL 4.  Sleep without limitation by pain Baseline: pain up to  8/10 when attempting to sleep Goal status: IIn progress -01/17/23    PLAN:  PT FREQUENCY:  1/week  PT DURATION: other: POC date  PLANNED INTERVENTIONS: Therapeutic exercises and Self Care.    Mayer Camel, PTA 01/17/23 11:50 AM [facility] [address] [place], [place], [address] Phone: [phone]   Fax:  [phone]   Referring diagnosis? M79.601 Treatment diagnosis? (if different than referring diagnosis) R29.3 What was this (referring dx) caused by? []  Surgery []  Fall [x]  Ongoing issue []  Arthritis []   Other: ____________  Laterality: [x]  Rt []  Lt []  Both  Check all possible CPT codes:  *CHOOSE 10 OR LESS*    [x]  97110 (Therapeutic Exercise)  []  92507 (SLP Treatment)  []  97112 (Neuro Re-ed)   []  92526 (Swallowing Treatment)   []  97116 (Gait Training)   []  [id number] (Cognitive Training, 1st 15 minutes) []  97140 (Manual Therapy)   []  97130 (Cognitive Training, each add'l 15 minutes)  []  97164 (Re-evaluation)                              []  Other, List CPT Code ____________  []  40981 (Therapeutic Activities)     [x]  97535 (Self Care)   []  All codes above (97110 - 97535)  []  97012 (Mechanical Traction)  []  97014 (E-stim Unattended)  []  97032 (E-stim manual)  []  97033 (Ionto)  []  97035 (Ultrasound) []  97750 (Physical Performance Training) []  [id number] (Aquatic Therapy) []  97016 (Vasopneumatic Device) []  [id number] (Paraffin) []  97034 (Contrast Bath) []  97597 (Wound Care 1st 20 sq cm) []  97598 (Wound Care each add'l 20 sq cm) []  97760 (Orthotic Fabrication, Fitting, Training Initial) []  [id number] (Prosthetic Management and Training Initial) []  [id number] (Orthotic or Prosthetic Training/ Modification Subsequent)

## 2023-01-24 ENCOUNTER — Encounter (HOSPITAL_BASED_OUTPATIENT_CLINIC_OR_DEPARTMENT_OTHER): Payer: Self-pay | Admitting: Rehabilitative and Restorative Service Providers"

## 2023-01-24 ENCOUNTER — Ambulatory Visit (HOSPITAL_BASED_OUTPATIENT_CLINIC_OR_DEPARTMENT_OTHER): Payer: Medicare PPO | Admitting: Rehabilitative and Restorative Service Providers"

## 2023-01-24 DIAGNOSIS — M25511 Pain in right shoulder: Secondary | ICD-10-CM | POA: Diagnosis not present

## 2023-01-24 DIAGNOSIS — R293 Abnormal posture: Secondary | ICD-10-CM | POA: Diagnosis not present

## 2023-01-24 DIAGNOSIS — G8929 Other chronic pain: Secondary | ICD-10-CM

## 2023-01-24 NOTE — Therapy (Signed)
OUTPATIENT PHYSICAL THERAPY    Patient Name: Austin Norris MRN: [id number] DOB:21-Mar-1956, 67 y.o., male Today's Date: 01/24/2023  END OF SESSION:  PT End of Session - 01/24/23 1013     Visit Number 6    Number of Visits 10    Date for PT Re-Evaluation 02/14/23    Authorization Type [employer] MCR    PT Start Time 1007    PT Stop Time 1048    PT Time Calculation (min) 41 min    Activity Tolerance Patient tolerated treatment well;No increased pain    Behavior During Therapy WFL for tasks assessed/performed                 Past Medical History:  Diagnosis Date   Diabetes mellitus    H/O epistaxis    History of stomach ulcers    30 YRS AGO   Hypercholesteremia    Hypertension    Shoulder pain, left    Umbilical hernia    Past Surgical History:  Procedure Laterality Date   CARDIAC CATHETERIZATION  1997   COLONOSCOPY     UMBILICAL HERNIA REPAIR N/A 07/11/2015   Procedure: LAPAROSCOPIC ASSISTED OPEN UMBILICAL HERNIA;  Surgeon: Luretha Murphy, MD;  Location: [facility];  Service: General;  Laterality: N/A;  With MESH   Patient Active Problem List   Diagnosis Date Noted   Delirium    Acute metabolic encephalopathy 03/22/2020   Type II or unspecified type diabetes mellitus without mention of complication, uncontrolled 12/10/2012   Pure hypercholesterolemia 12/10/2012   Unspecified essential hypertension 12/10/2012    REFERRING PROVIDER:  Christena Deem, MD    REFERRING DIAG: M79.601 (ICD-10-CM) - Pain in right arm   Rationale for Evaluation and Treatment: Rehabilitation  THERAPY DIAG:  Abnormal posture  Chronic right shoulder pain  ONSET DATE: 2-3 mo ago   SUBJECTIVE:                                                                                                                                                                                           SUBJECTIVE STATEMENT: My pain came back about 2 days ago. Last night the pain was 8/10 and I had to take  an 800 mg Ibuprofen but today was 6/10. The shoulder pain increases with weather. Pt late to appt.   PERTINENT HISTORY:  none  PAIN:  Are you having pain? no: NPRS scale: 6/10 Pain location: lumbar Pain description: hurts Aggravating factors: laying down to rest Relieving factors: rubs  PRECAUTIONS:  None  WEIGHT BEARING RESTRICTIONS:  No  FALLS:  Has patient fallen in last 6 months? No  OCCUPATION:  Sales  manager of car lot, computer work at desk  PLOF:  Independent  PATIENT GOALS:  sleep   OBJECTIVE:   DIAGNOSTIC FINDINGS:  MRI 04/04/22 IMPRESSION: 1. Distal to the shoulder, no significant findings identified in the right upper arm. 2. Rotator cuff tendinosis with probable high-grade partial tearing of the distal supraspinatus tendon, suboptimally evaluated due to large field of view. Consider dedicated shoulder MRI for further evaluation. 3. Mild glenohumeral and acromioclavicular degenerative changes.  PATIENT SURVEYS:  FOTO 53  COGNITIVE STATUS: Within functional limits for tasks assessed   RED FLAGS: None    POSTURE:  EVAL: incr thoracic kyphosis, bil shoulder fwd rotation 8/24: forward head, incr thoracic kyphosis & bil shoulder fwd rotation  HAND DOMINANCE:  Right    Body Part #1 Shoulder  PALPATION: EVAL: denies TTP through neck/shoulders  Eval: ROM is equal and WFL but reports discomfort at end range flexion on Right side  8/24: GHJ AROM is [facility] but right hikes with flexion and IR behind back; cervical sidebend equal and tight  8/31 slight right sidebend when performing bil GHJ flexion    TREATMENT:             Treatment                            01/24/23: -Seated lumbar flexion stretch 2x15 -Lower trunk rotation x 5 with 5 sec hold -L long axis distraction x 20 -pelvic tilt x 20 with focus on form and proper muscle activation -Review GTB exercises from HEP with max verbal and tactile cues for technique -knee to chest stretch  each side 2x30 sec -knee to opposite shoulder stretch 2x30 sec ea side -tilt with SLR x 15 ea side -tilt with ball squeeze x 15 -tilt with ball squeeze and bridge combo x 15                                                                                                                     Treatment                            9/21: * single knee to chest x 15s each * reviewed form with wrist stretch, cues to lower arm and relax upper trap * hooklying: 2 sets of 10 of each, with green band: sash (D2 flexion), bilat horiz abdct, bilat overhead reach "touch down" in painfree range, and bilat shoulder ER in painfree range * open book x 5 reps each side  * cat/cow x 5 reps, wag the tail x 5 * single arm doorway stretch (~110 deg abdct) x 10s x 2 each side; cues for form   Treatment                            9/7: Upper trap & levator stretch 30sec ea/bil Seated chin tuck- 3sec x10 L wrist flex/ext stretching  Door way stretch L only 15sec x2 Rows/ext Blue TB x20ea- Cues for posture Child's pose stretch 20sec x3 Cat/cow in quadruped 3sec hold x10  Piriformis stretch- 30sec x2 ea Hooklying marches with trA 2x10ea Bridges 2x10 HEP update- piriformis, child's pose, wrist flexor stretch  Treatment                            8/31:  Trigger Point Dry Needling, Manual Therapy Treatment:  Initial or subsequent education regarding Trigger Point Dry Needling: Initial - education time included in Manual Did patient give consent to treatment with Trigger Point Dry Needling: Yes TPDN with skilled palpation and monitoring followed by STM to the following muscles: Rt glut med, Lt rhomoid  Prone rib mobs, inf sacral mobs bil Anchored rows green Wall sit, OH reach with pull out on band L stretch Reviewed form in HEP exercises   Treatment                            8/24:  Scap retraction Upper trap & levator stretch Door pec stretch Row blue tband ER red tband Manual: STM Rt upper trap,  passive stretching of Rt GHJ flexion & abd   DATE: 6/27 EVAL Seated Scapular Retraction  - 5 x daily - 7 x weekly - 10 reps - Standing Upper Trapezius Stretch  - 2 x daily - 7 x weekly - 2 sets - 3 breaths hold - Gentle Levator Scapulae Stretch  - 2 x daily - 7 x weekly - 2 sets - 3 breaths hold - Doorway Pec Stretch at 90 Degrees Abduction  - 2 x daily - 7 x weekly - 2 sets - 3 breaths hold  Discussed posture corrector    PATIENT EDUCATION:  Education details: HEP, exercise form/rationale Person educated: Patient Education method: Explanation, Demonstration, Tactile cues, Verbal cues, and Handouts Education comprehension: verbalized understanding, returned demonstration, verbal cues required, tactile cues required, and needs further education  HOME EXERCISE PROGRAM: [phone]  Access Code: [email or web address] URL: [email or web address] Date: 01/17/2023  - exercises with band to be completed in hooklying position, switched doorway stretch to one arm at a time. Issued green band today.   ASSESSMENT:  CLINICAL IMPRESSION: Pt presents to PT with increased L lumbar pain x 2 days. Pt is unable to state what increased the pain or when he first felt it. PT encouraged him when he has a flare to begin thinking of new activities he performed to target bothersome activity. Pt agreed. Focus today was on reduction of low back pain and improved lumbar ROM/flexibility and review of newest HEP.  Will continue to progress with strengthening as tolerated. Pt making gradual progress towards goals. Pt needed verbal cues for breathing for core stabilization exercises.    OBJECTIVE IMPAIRMENTS: postural dysfunction.   ACTIVITY LIMITATIONS:  sleepign  PARTICIPATION LIMITATIONS:  sleep  PERSONAL FACTORS:  work requirements  are also affecting patient's functional outcome.   REHAB POTENTIAL: Good  CLINICAL DECISION MAKING: Stable/uncomplicated  EVALUATION COMPLEXITY: Low   GOALS: Goals  reviewed with patient? Yes 1.  Rt GHJ ROM without shoulder hike Baseline:  Goal status: INITIAL. 2.  Pt will verbalize postural corrections throughout his day Baseline:  Goal status:In progress -01/17/23 3.  Independent in long term HEP Baseline:  Goal status: INITIAL 4.  Sleep without limitation by pain Baseline: pain up to 8/10 when attempting to sleep Goal status: IIn progress -  01/17/23    PLAN:  PT FREQUENCY:  1/week  PT DURATION: other: POC date  PLANNED INTERVENTIONS: Therapeutic exercises and Self Care.; review band exercises again for L shoulder (horiz abduction, ER, PNF D2)       Referring diagnosis? M79.601 Treatment diagnosis? (if different than referring diagnosis) R29.3 What was this (referring dx) caused by? []  Surgery []  Fall [x]  Ongoing issue []  Arthritis []  Other: ____________  Laterality: [x]  Rt []  Lt []  Both  Check all possible CPT codes:  *CHOOSE 10 OR LESS*    [x]  97110 (Therapeutic Exercise)  []  92507 (SLP Treatment)  []  97112 (Neuro Re-ed)   []  92526 (Swallowing Treatment)   []  97116 (Gait Training)   []  [id number] (Cognitive Training, 1st 15 minutes) []  97140 (Manual Therapy)   []  97130 (Cognitive Training, each add'l 15 minutes)  []  97164 (Re-evaluation)                              []  Other, List CPT Code ____________  []  97530 (Therapeutic Activities)     [x]  97535 (Self Care)   []  All codes above (97110 - 97535)  []  97012 (Mechanical Traction)  []  97014 (E-stim Unattended)  []  97032 (E-stim manual)  []  97033 (Ionto)  []  97035 (Ultrasound) []  97750 (Physical Performance Training) []  [id number] (Aquatic Therapy) []  97016 (Vasopneumatic Device) []  [id number] (Paraffin) []  97034 (Contrast Bath) []  97597 (Wound Care 1st 20 sq cm) []  86578 (Wound Care each add'l 20 sq cm) []  97760 (Orthotic Fabrication, Fitting, Training Initial) []  [id number] (Prosthetic Management and Training Initial) []  [id number] (Orthotic or Prosthetic Training/ Modification  Subsequent)

## 2023-01-26 DIAGNOSIS — H2512 Age-related nuclear cataract, left eye: Secondary | ICD-10-CM | POA: Diagnosis not present

## 2023-01-26 DIAGNOSIS — H25012 Cortical age-related cataract, left eye: Secondary | ICD-10-CM | POA: Diagnosis not present

## 2023-01-28 DIAGNOSIS — H6121 Impacted cerumen, right ear: Secondary | ICD-10-CM | POA: Diagnosis not present

## 2023-01-28 DIAGNOSIS — H6993 Unspecified Eustachian tube disorder, bilateral: Secondary | ICD-10-CM | POA: Diagnosis not present

## 2023-01-31 ENCOUNTER — Ambulatory Visit (HOSPITAL_BASED_OUTPATIENT_CLINIC_OR_DEPARTMENT_OTHER): Payer: Medicare PPO | Attending: Sports Medicine | Admitting: Physical Therapy

## 2023-01-31 DIAGNOSIS — R293 Abnormal posture: Secondary | ICD-10-CM | POA: Insufficient documentation

## 2023-01-31 DIAGNOSIS — G8929 Other chronic pain: Secondary | ICD-10-CM | POA: Insufficient documentation

## 2023-01-31 DIAGNOSIS — M25511 Pain in right shoulder: Secondary | ICD-10-CM | POA: Diagnosis not present

## 2023-01-31 NOTE — Therapy (Signed)
OUTPATIENT PHYSICAL THERAPY    Patient Name: Austin Norris MRN: [id number] DOB:December 03, 1955, 67 y.o., male Today's Date: 01/31/2023  END OF SESSION:  PT End of Session - 01/31/23 1040     Visit Number 7    Number of Visits 10    Date for PT Re-Evaluation 02/14/23    Authorization Type [employer] MCR    PT Start Time 1005    PT Stop Time 1043    PT Time Calculation (min) 38 min    Activity Tolerance Patient tolerated treatment well;No increased pain    Behavior During Therapy WFL for tasks assessed/performed                  Past Medical History:  Diagnosis Date   Diabetes mellitus    H/O epistaxis    History of stomach ulcers    30 YRS AGO   Hypercholesteremia    Hypertension    Shoulder pain, left    Umbilical hernia    Past Surgical History:  Procedure Laterality Date   CARDIAC CATHETERIZATION  1997   COLONOSCOPY     UMBILICAL HERNIA REPAIR N/A 07/11/2015   Procedure: LAPAROSCOPIC ASSISTED OPEN UMBILICAL HERNIA;  Surgeon: Luretha Murphy, MD;  Location: [facility];  Service: General;  Laterality: N/A;  With MESH   Patient Active Problem List   Diagnosis Date Noted   Delirium    Acute metabolic encephalopathy 03/22/2020   Type II or unspecified type diabetes mellitus without mention of complication, uncontrolled 12/10/2012   Pure hypercholesterolemia 12/10/2012   Essential hypertension 12/10/2012    REFERRING PROVIDER:  Christena Deem, MD    REFERRING DIAG: M79.601 (ICD-10-CM) - Pain in right arm   Rationale for Evaluation and Treatment: Rehabilitation  THERAPY DIAG:  Abnormal posture  Chronic right shoulder pain  ONSET DATE: 2-3 mo ago   SUBJECTIVE:                                                                                                                                                                                           SUBJECTIVE STATEMENT: I am getting better slowly. My back is starting to feel a little better. My arms still have  pain, and it seems to move around.  Pt late to appt.   PERTINENT HISTORY:  none  PAIN:  Are you having pain? no: NPRS scale: 6/10 Pain location: lumbar Pain description: hurts Aggravating factors: laying down to rest Relieving factors: rubs  PRECAUTIONS:  None  WEIGHT BEARING RESTRICTIONS:  No  FALLS:  Has patient fallen in last 6 months? No  OCCUPATION:  Transport planner of car lot, computer work at  desk  PLOF:  Independent  PATIENT GOALS:  sleep   OBJECTIVE:   DIAGNOSTIC FINDINGS:  MRI 04/04/22 IMPRESSION: 1. Distal to the shoulder, no significant findings identified in the right upper arm. 2. Rotator cuff tendinosis with probable high-grade partial tearing of the distal supraspinatus tendon, suboptimally evaluated due to large field of view. Consider dedicated shoulder MRI for further evaluation. 3. Mild glenohumeral and acromioclavicular degenerative changes.  PATIENT SURVEYS:  FOTO 53  COGNITIVE STATUS: Within functional limits for tasks assessed   RED FLAGS: None    POSTURE:  EVAL: incr thoracic kyphosis, bil shoulder fwd rotation 8/24: forward head, incr thoracic kyphosis & bil shoulder fwd rotation  HAND DOMINANCE:  Right    Body Part #1 Shoulder  PALPATION: EVAL: denies TTP through neck/shoulders  Eval: ROM is equal and WFL but reports discomfort at end range flexion on Right side  8/24: [facility]J AROM is [facility] but right hikes with flexion and IR behind back; cervical sidebend equal and tight  8/31 slight right sidebend when performing bil GHJ flexion    TREATMENT:     Treatment                            01/31/23:        - Physio ball roll outs -Lower trunk rotation x 5 with 5 sec hold -pelvic tilt x 20 with focus on form and proper muscle activation -knee to chest stretch each side 2x30 sec -tilt with ball squeeze x 15 -tilt with ball squeeze and bridge combo x 15 - Seated OH flexion with theraball x15  - Sit <> stand with 10lb kettle  bell.    Treatment                            01/24/23: -Seated lumbar flexion stretch 2x15 -Lower trunk rotation x 5 with 5 sec hold -L long axis distraction x 20 -pelvic tilt x 20 with focus on form and proper muscle activation -Review GTB exercises from HEP with max verbal and tactile cues for technique -knee to chest stretch each side 2x30 sec -knee to opposite shoulder stretch 2x30 sec ea side -tilt with SLR x 15 ea side -tilt with ball squeeze x 15 -tilt with ball squeeze and bridge combo x 15                                                                                                                     Treatment                            9/21: * single knee to chest x 15s each * reviewed form with wrist stretch, cues to lower arm and relax upper trap * hooklying: 2 sets of 10 of each, with green band: sash (D2 flexion), bilat horiz abdct, bilat overhead reach "touch down" in painfree range, and  bilat shoulder ER in painfree range * open book x 5 reps each side  * cat/cow x 5 reps, wag the tail x 5 * single arm doorway stretch (~110 deg abdct) x 10s x 2 each side; cues for form   Treatment                            9/7: Upper trap & levator stretch 30sec ea/bil Seated chin tuck- 3sec x10 L wrist flex/ext stretching Door way stretch L only 15sec x2 Rows/ext Blue TB x20ea- Cues for posture Child's pose stretch 20sec x3 Cat/cow in quadruped 3sec hold x10  Piriformis stretch- 30sec x2 ea Hooklying marches with trA 2x10ea Bridges 2x10 HEP update- piriformis, child's pose, wrist flexor stretch  Treatment                            8/31:  Trigger Point Dry Needling, Manual Therapy Treatment:  Initial or subsequent education regarding Trigger Point Dry Needling: Initial - education time included in Manual Did patient give consent to treatment with Trigger Point Dry Needling: Yes TPDN with skilled palpation and monitoring followed by STM to the following muscles: Rt glut  med, Lt rhomoid  Prone rib mobs, inf sacral mobs bil Anchored rows green Wall sit, OH reach with pull out on band L stretch Reviewed form in HEP exercises   Treatment                            8/24:  Scap retraction Upper trap & levator stretch Door pec stretch Row blue tband ER red tband Manual: STM Rt upper trap, passive stretching of Rt GHJ flexion & abd   DATE: 6/27 EVAL Seated Scapular Retraction  - 5 x daily - 7 x weekly - 10 reps - Standing Upper Trapezius Stretch  - 2 x daily - 7 x weekly - 2 sets - 3 breaths hold - Gentle Levator Scapulae Stretch  - 2 x daily - 7 x weekly - 2 sets - 3 breaths hold - Doorway Pec Stretch at 90 Degrees Abduction  - 2 x daily - 7 x weekly - 2 sets - 3 breaths hold  Discussed posture corrector    PATIENT EDUCATION:  Education details: HEP, exercise form/rationale Person educated: Patient Education method: Explanation, Demonstration, Tactile cues, Verbal cues, and Handouts Education comprehension: verbalized understanding, returned demonstration, verbal cues required, tactile cues required, and needs further education  HOME EXERCISE PROGRAM: [phone]  Access Code: [email or web address] URL: [email or web address] Date: 01/17/2023  - exercises with band to be completed in hooklying position, switched doorway stretch to one arm at a time. Issued green band today.   ASSESSMENT:  CLINICAL IMPRESSION: Pt presents to PT with continued lumbar pain, but reports noting slow improvements since starting PT.  Discussed posture when sitting for work with arm support and upright posture. Practiced sit <> stand with 10lb weight with focus on proper lifting mechanics. Pt has tendency to use back and not legs. Pt left with no increase in pain today.  Will continue to progress with strengthening as tolerated. Pt making gradual progress towards goals. Pt needed verbal cues for breathing for core stabilization exercises.    OBJECTIVE IMPAIRMENTS:  postural dysfunction.   ACTIVITY LIMITATIONS:  sleepign  PARTICIPATION LIMITATIONS:  sleep  PERSONAL FACTORS:  work requirements  are also affecting patient's functional outcome.  REHAB POTENTIAL: Good  CLINICAL DECISION MAKING: Stable/uncomplicated  EVALUATION COMPLEXITY: Low   GOALS: Goals reviewed with patient? Yes 1.  Rt GHJ ROM without shoulder hike Baseline:  Goal status: INITIAL. 2.  Pt will verbalize postural corrections throughout his day Baseline:  Goal status:In progress -01/17/23 3.  Independent in long term HEP Baseline:  Goal status: INITIAL 4.  Sleep without limitation by pain Baseline: pain up to 8/10 when attempting to sleep Goal status: IIn progress -01/17/23    PLAN:  PT FREQUENCY:  1/week  PT DURATION: other: POC date  PLANNED INTERVENTIONS: Therapeutic exercises and Self Care.; review band exercises again for L shoulder (horiz abduction, ER, PNF D2)       Referring diagnosis? M79.601 Treatment diagnosis? (if different than referring diagnosis) R29.3 What was this (referring dx) caused by? []  Surgery []  Fall [x]  Ongoing issue []  Arthritis []  Other: ____________  Laterality: [x]  Rt []  Lt []  Both  Check all possible CPT codes:  *CHOOSE 10 OR LESS*    [x]  97110 (Therapeutic Exercise)  []  92507 (SLP Treatment)  []  97112 (Neuro Re-ed)   []  92526 (Swallowing Treatment)   []  97116 (Gait Training)   []  [id number] (Cognitive Training, 1st 15 minutes) []  97140 (Manual Therapy)   []  97130 (Cognitive Training, each add'l 15 minutes)  []  97164 (Re-evaluation)                              []  Other, List CPT Code ____________  []  97530 (Therapeutic Activities)     [x]  97535 (Self Care)   []  All codes above (97110 - 97535)  []  97012 (Mechanical Traction)  []  97014 (E-stim Unattended)  []  97032 (E-stim manual)  []  97033 (Ionto)  []  97035 (Ultrasound) []  08657 (Physical Performance Training) []  [id number] (Aquatic Therapy) []  97016 (Vasopneumatic  Device) []  [id number] (Paraffin) []  97034 (Contrast Bath) []  97597 (Wound Care 1st 20 sq cm) []  97598 (Wound Care each add'l 20 sq cm) []  97760 (Orthotic Fabrication, Fitting, Training Initial) []  [id number] (Prosthetic Management and Training Initial) []  [id number] (Orthotic or Prosthetic Training/ Modification Subsequent)

## 2023-02-02 DIAGNOSIS — M19012 Primary osteoarthritis, left shoulder: Secondary | ICD-10-CM | POA: Diagnosis not present

## 2023-02-02 DIAGNOSIS — M19011 Primary osteoarthritis, right shoulder: Secondary | ICD-10-CM | POA: Diagnosis not present

## 2023-02-02 DIAGNOSIS — M4802 Spinal stenosis, cervical region: Secondary | ICD-10-CM | POA: Diagnosis not present

## 2023-02-05 IMAGING — CR DG LUMBAR SPINE COMPLETE 4+V
5 series · 5 of 5 positions shown · non-contrast
Comparison: Lumbar spine x-ray 08/03/2011

CLINICAL DATA: Acute back pain

EXAM:
LUMBAR SPINE - COMPLETE 4+ VIEW

[t l-spine a.p.]
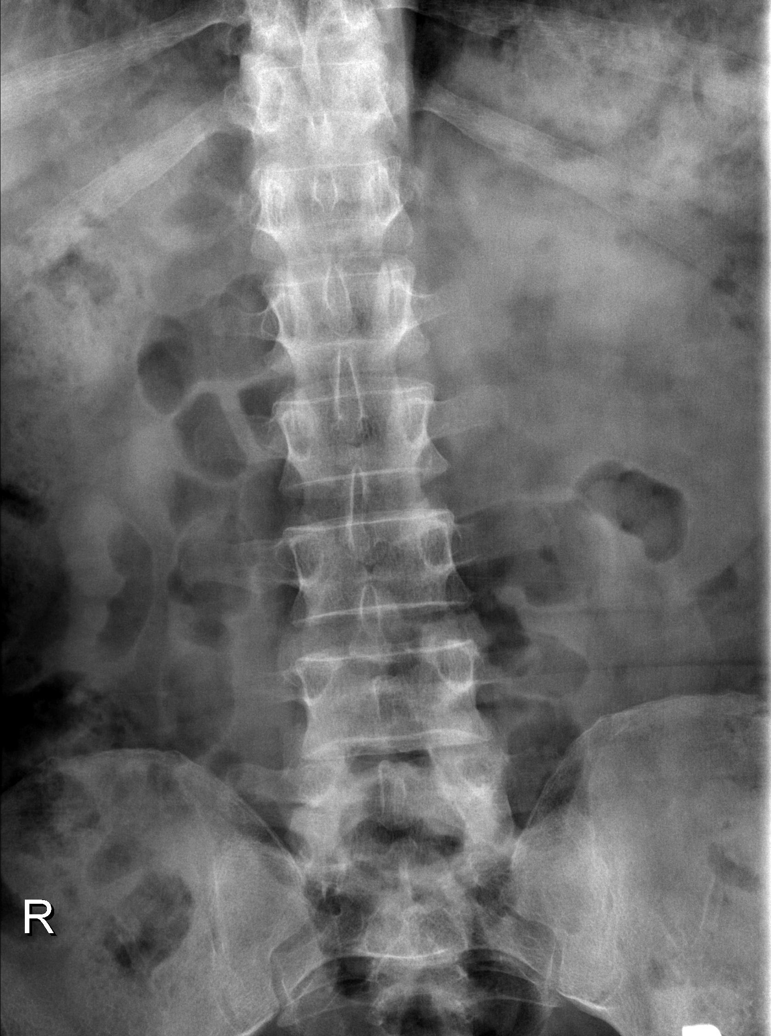

[t l-spine oblique exposure (1 of 2)]
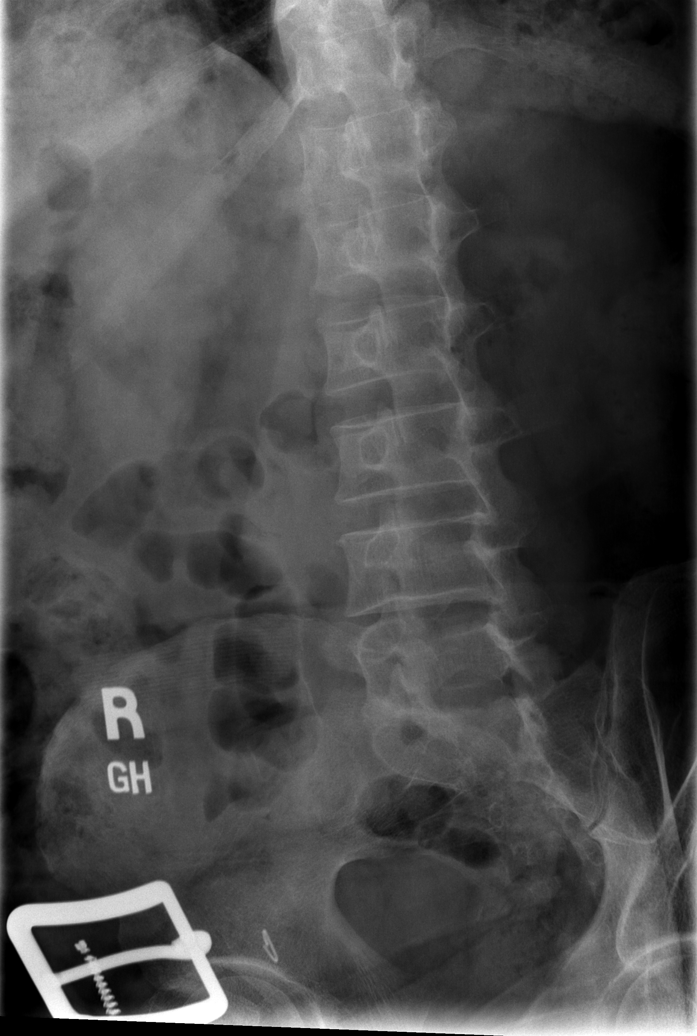

[t l-spine oblique exposure (2 of 2)]
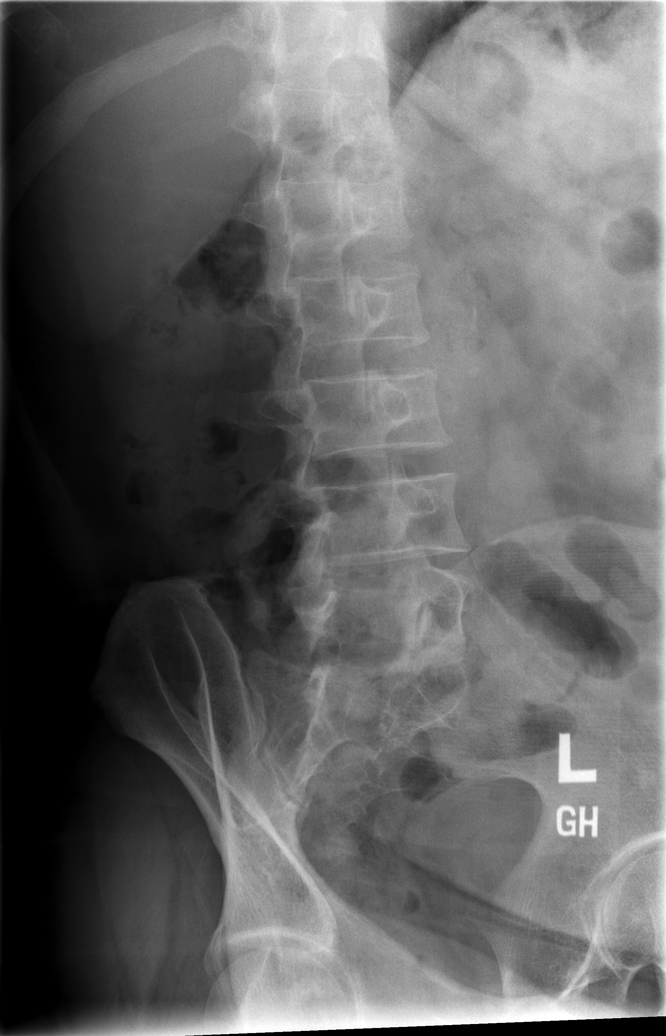

[t l-spine lat]
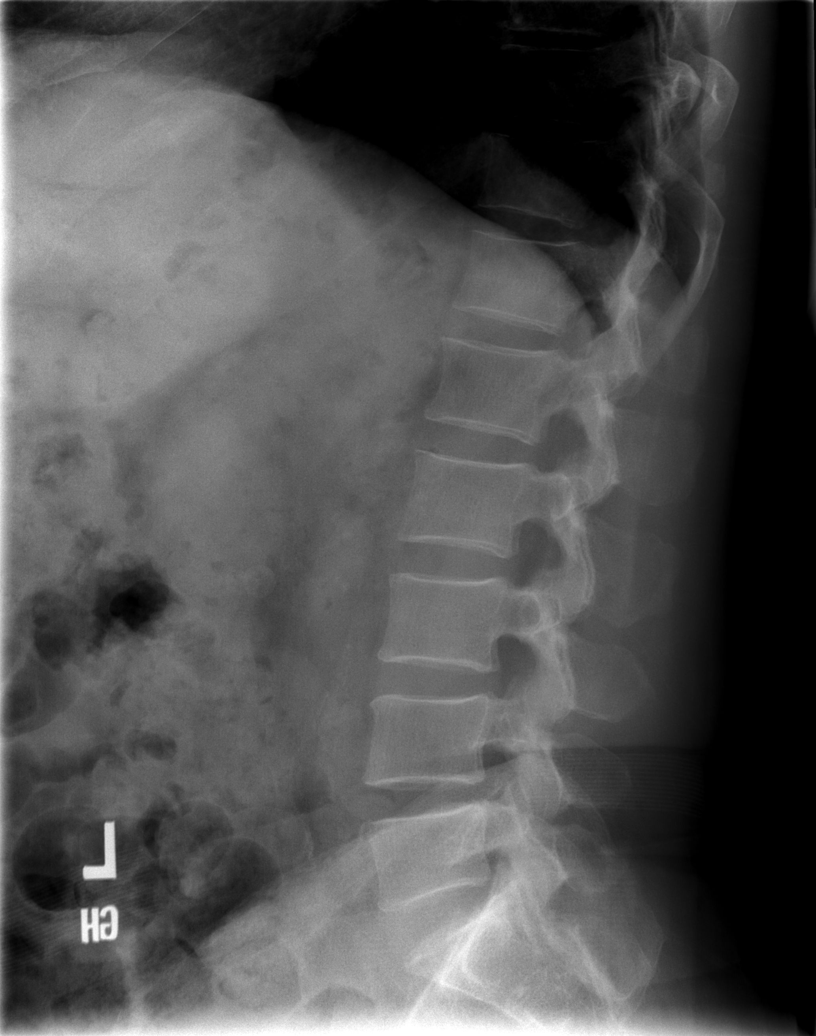

[t l-spine l5-s1 spot]
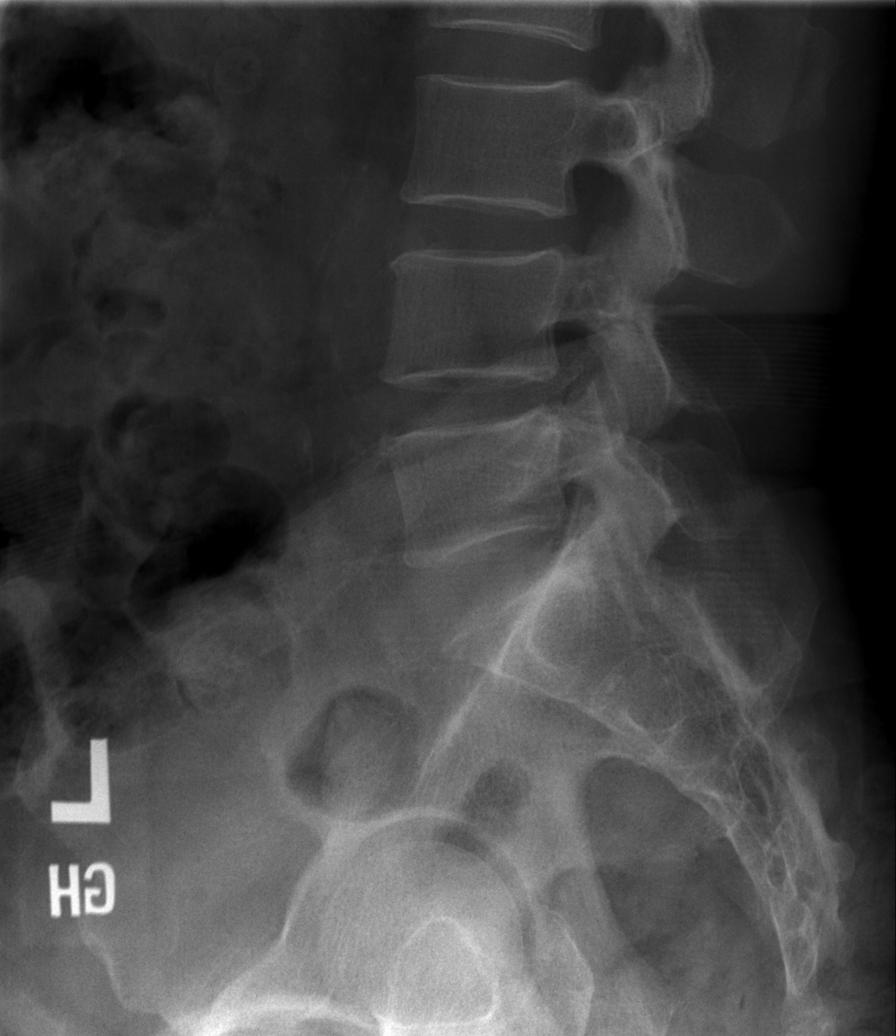

[5 of 5 positions shown; findings below may reference images not displayed]

FINDINGS: There is no evidence of lumbar spine fracture. Alignment is normal.
No spondylolysis. Intervertebral disc spaces are maintained.
IMPRESSION: Negative.

## 2023-02-07 ENCOUNTER — Encounter (HOSPITAL_BASED_OUTPATIENT_CLINIC_OR_DEPARTMENT_OTHER): Payer: Medicare PPO | Admitting: Physical Therapy

## 2023-02-14 ENCOUNTER — Ambulatory Visit (HOSPITAL_BASED_OUTPATIENT_CLINIC_OR_DEPARTMENT_OTHER): Payer: Medicare PPO

## 2023-02-14 ENCOUNTER — Encounter (HOSPITAL_BASED_OUTPATIENT_CLINIC_OR_DEPARTMENT_OTHER): Payer: Self-pay

## 2023-02-14 DIAGNOSIS — R293 Abnormal posture: Secondary | ICD-10-CM

## 2023-02-14 DIAGNOSIS — G8929 Other chronic pain: Secondary | ICD-10-CM

## 2023-02-14 DIAGNOSIS — M25511 Pain in right shoulder: Secondary | ICD-10-CM | POA: Diagnosis not present

## 2023-02-14 NOTE — Therapy (Signed)
OUTPATIENT PHYSICAL THERAPY    Patient Name: Austin Norris MRN: [id number] DOB:09-02-1955, 67 y.o., male Today's Date: 02/14/2023  END OF SESSION:  PT End of Session - 02/14/23 1058     Visit Number 8    Number of Visits 10    Date for PT Re-Evaluation 02/14/23    Authorization Type [employer] MCR    PT Start Time 1052    PT Stop Time 1134    PT Time Calculation (min) 42 min    Activity Tolerance Patient tolerated treatment well    Behavior During Therapy WFL for tasks assessed/performed                   Past Medical History:  Diagnosis Date   Diabetes mellitus    H/O epistaxis    History of stomach ulcers    30 YRS AGO   Hypercholesteremia    Hypertension    Shoulder pain, left    Umbilical hernia    Past Surgical History:  Procedure Laterality Date   CARDIAC CATHETERIZATION  1997   COLONOSCOPY     UMBILICAL HERNIA REPAIR N/A 07/11/2015   Procedure: LAPAROSCOPIC ASSISTED OPEN UMBILICAL HERNIA;  Surgeon: Luretha Murphy, MD;  Location: [facility];  Service: General;  Laterality: N/A;  With MESH   Patient Active Problem List   Diagnosis Date Noted   Delirium    Acute metabolic encephalopathy 03/22/2020   Type II or unspecified type diabetes mellitus without mention of complication, uncontrolled 12/10/2012   Pure hypercholesterolemia 12/10/2012   Essential hypertension 12/10/2012    REFERRING PROVIDER:  Christena Deem, MD    REFERRING DIAG: M79.601 (ICD-10-CM) - Pain in right arm   Rationale for Evaluation and Treatment: Rehabilitation  THERAPY DIAG:  Abnormal posture  Chronic right shoulder pain  ONSET DATE: 2-3 mo ago   SUBJECTIVE:                                                                                                                                                                                           SUBJECTIVE STATEMENT: Pt reports increased pain the other day, unsure why. Hurts when sitting still, better with movement. Worse in  the morning.  Pt had L shoulder injection on 10/7 which did not provide relief.   PERTINENT HISTORY:  none  PAIN:  Are you having pain? yes: NPRS scale: 3/10 Pain location: lumbar Pain description: hurts Aggravating factors: laying down to rest Relieving factors: rubs  PRECAUTIONS:  None  WEIGHT BEARING RESTRICTIONS:  No  FALLS:  Has patient fallen in last 6 months? No  OCCUPATION:  Transport planner of car lot, computer  work at desk  PLOF:  Independent  PATIENT GOALS:  sleep   OBJECTIVE:   DIAGNOSTIC FINDINGS:  MRI 04/04/22 IMPRESSION: 1. Distal to the shoulder, no significant findings identified in the right upper arm. 2. Rotator cuff tendinosis with probable high-grade partial tearing of the distal supraspinatus tendon, suboptimally evaluated due to large field of view. Consider dedicated shoulder MRI for further evaluation. 3. Mild glenohumeral and acromioclavicular degenerative changes.  PATIENT SURVEYS:  FOTO 53  COGNITIVE STATUS: Within functional limits for tasks assessed   RED FLAGS: None    POSTURE:  EVAL: incr thoracic kyphosis, bil shoulder fwd rotation 8/24: forward head, incr thoracic kyphosis & bil shoulder fwd rotation  HAND DOMINANCE:  Right    Body Part #1 Shoulder  PALPATION: EVAL: denies TTP through neck/shoulders  Eval: ROM is equal and WFL but reports discomfort at end range flexion on Right side  8/24: GHJ AROM is [facility] but right hikes with flexion and IR behind back; cervical sidebend equal and tight  8/31 slight right sidebend when performing bil GHJ flexion    TREATMENT:      Treatment                            02/14/23:        - Physio ball roll outs 5 seconds x10 -Lower trunk rotation x 5 with 5 sec hold -DKTC 30sec x3 -pelvic tilt x 20 with focus on form and proper muscle activation -tilt with ball squeeze x 20 -tilt with ball squeeze and bridge combo 2x10 -Hooklying clam GTB x30 with TrA -Standing lumbar AROM -  Sit <> stand with 10lb dumbbell 2x10 -retro walking at cable 20lbs x8 (cues for increased step length and slow, controlled pace  Treatment                            01/31/23:        - Physio ball roll outs -Lower trunk rotation x 5 with 5 sec hold -pelvic tilt x 20 with focus on form and proper muscle activation -knee to chest stretch each side 2x30 sec -tilt with ball squeeze x 15 -tilt with ball squeeze and bridge combo x 15 - Seated OH flexion with theraball x15  - Sit <> stand with 10lb kettle bell.    Treatment                            01/24/23: -Seated lumbar flexion stretch 2x15 -Lower trunk rotation x 5 with 5 sec hold -L long axis distraction x 20 -pelvic tilt x 20 with focus on form and proper muscle activation -Review GTB exercises from HEP with max verbal and tactile cues for technique -knee to chest stretch each side 2x30 sec -knee to opposite shoulder stretch 2x30 sec ea side -tilt with SLR x 15 ea side -tilt with ball squeeze x 15 -tilt with ball squeeze and bridge combo x 15  Treatment                            9/21: * single knee to chest x 15s each * reviewed form with wrist stretch, cues to lower arm and relax upper trap * hooklying: 2 sets of 10 of each, with green band: sash (D2 flexion), bilat horiz abdct, bilat overhead reach "touch down" in painfree range, and bilat shoulder ER in painfree range * open book x 5 reps each side  * cat/cow x 5 reps, wag the tail x 5 * single arm doorway stretch (~110 deg abdct) x 10s x 2 each side; cues for form   Treatment                            9/7: Upper trap & levator stretch 30sec ea/bil Seated chin tuck- 3sec x10 L wrist flex/ext stretching Door way stretch L only 15sec x2 Rows/ext Blue TB x20ea- Cues for posture Child's pose stretch 20sec x3 Cat/cow in quadruped 3sec hold x10  Piriformis  stretch- 30sec x2 ea Hooklying marches with trA 2x10ea Bridges 2x10 HEP update- piriformis, child's pose, wrist flexor stretch  PATIENT EDUCATION:  Education details: HEP, exercise form/rationale Person educated: Patient Education method: Explanation, Demonstration, Tactile cues, Verbal cues, and Handouts Education comprehension: verbalized understanding, returned demonstration, verbal cues required, tactile cues required, and needs further education  HOME EXERCISE PROGRAM: [phone]  Access Code: [email or web address] URL: [email or web address] Date: 01/17/2023  - exercises with band to be completed in hooklying position, switched doorway stretch to one arm at a time. Issued green band today.   ASSESSMENT:  CLINICAL IMPRESSION: Pt remains tight into lumbar extensors. Felt relief with DKTC stretching and fwd roll outs using physioball. Following plinth exercises, he denied pain with all lumbar AROM in standing aside from end range extension. Trialed backwards resisted walking at cable column today to work on posterior chain strengthening and improving hip extension. He did well with this following cues for increased step length and controlled pace.  Re-eval next visit.   OBJECTIVE IMPAIRMENTS: postural dysfunction.   ACTIVITY LIMITATIONS:  sleepign  PARTICIPATION LIMITATIONS:  sleep  PERSONAL FACTORS:  work requirements  are also affecting patient's functional outcome.   REHAB POTENTIAL: Good  CLINICAL DECISION MAKING: Stable/uncomplicated  EVALUATION COMPLEXITY: Low   GOALS: Goals reviewed with patient? Yes 1.  Rt GHJ ROM without shoulder hike Baseline:  Goal status: INITIAL. 2.  Pt will verbalize postural corrections throughout his day Baseline:  Goal status:In progress -01/17/23 3.  Independent in long term HEP Baseline:  Goal status: INITIAL 4.  Sleep without limitation by pain Baseline: pain up to 8/10 when attempting to sleep Goal status: IIn progress  -01/17/23    PLAN:  PT FREQUENCY:  1/week  PT DURATION: other: POC date  PLANNED INTERVENTIONS: Therapeutic exercises and Self Care.; review band exercises again for L shoulder (horiz abduction, ER, PNF D2)   Riki Altes, PTA   Referring diagnosis? M79.601 Treatment diagnosis? (if different than referring diagnosis) R29.3 What was this (referring dx) caused by? []  Surgery []  Fall [x]  Ongoing issue []  Arthritis []  Other: ____________  Laterality: [x]  Rt []  Lt []  Both  Check all possible CPT codes:  *CHOOSE 10 OR LESS*    [x]  97110 (Therapeutic Exercise)  []  92507 (SLP Treatment)  []  56433 (Neuro Re-ed)   []  92526 (Swallowing Treatment)   []  97116 (Gait Training)   []   16109 (Cognitive Training, 1st 15 minutes) []  97140 (Manual Therapy)   []  97130 (Cognitive Training, each add'l 15 minutes)  []  97164 (Re-evaluation)                              []  Other, List CPT Code ____________  []  97530 (Therapeutic Activities)     [x]  60454 (Self Care)   []  All codes above (97110 - 97535)  []  97012 (Mechanical Traction)  []  97014 (E-stim Unattended)  []  97032 (E-stim manual)  []  97033 (Ionto)  []  97035 (Ultrasound) []  97750 (Physical Performance Training) []  [id number] (Aquatic Therapy) []  97016 (Vasopneumatic Device) []  [id number] (Paraffin) []  97034 (Contrast Bath) []  97597 (Wound Care 1st 20 sq cm) []  97598 (Wound Care each add'l 20 sq cm) []  97760 (Orthotic Fabrication, Fitting, Training Initial) []  [id number] (Prosthetic Management and Training Initial) []  [id number] (Orthotic or Prosthetic Training/ Modification Subsequent)

## 2023-02-16 ENCOUNTER — Other Ambulatory Visit: Payer: Self-pay | Admitting: Cardiology

## 2023-02-16 DIAGNOSIS — E782 Mixed hyperlipidemia: Secondary | ICD-10-CM

## 2023-02-16 DIAGNOSIS — Z794 Long term (current) use of insulin: Secondary | ICD-10-CM

## 2023-02-23 ENCOUNTER — Telehealth: Payer: Self-pay | Admitting: Endocrinology

## 2023-02-23 ENCOUNTER — Other Ambulatory Visit: Payer: Self-pay

## 2023-02-23 DIAGNOSIS — E118 Type 2 diabetes mellitus with unspecified complications: Secondary | ICD-10-CM

## 2023-02-23 DIAGNOSIS — Z794 Long term (current) use of insulin: Secondary | ICD-10-CM

## 2023-02-23 MED ORDER — SEMAGLUTIDE (1 MG/DOSE) 4 MG/3ML ~~LOC~~ SOPN: 1.0000 mg | PEN_INJECTOR | SUBCUTANEOUS | 3 refills | Status: AC

## 2023-02-23 NOTE — Telephone Encounter (Signed)
MEDICATION:  Ozempic (1 MG/DOSE) Semaglutide, 1 MG/DOSE, (OZEMPIC, 1 MG/DOSE,) 2 MG/1.5ML SOPN  PHARMACY:    [id number] - [place], [place] - [address] AT [address] [facility] [address] (Ph: [phone])    HAS THE PATIENT CONTACTED THEIR PHARMACY?  Yes  IS THIS A 90 DAY SUPPLY : Yes  IS PATIENT OUT OF MEDICATION: Yes  IF NOT; HOW MUCH IS LEFT:   LAST APPOINTMENT DATE: @9 /17/2024  NEXT APPOINTMENT DATE:@1 /20/2025  DO WE HAVE YOUR PERMISSION TO LEAVE A DETAILED MESSAGE?:  OTHER COMMENTS:    **Let patient know to contact pharmacy at the end of the day to make sure medication is ready. **  ** Please notify patient to allow 48-72 hours to process**  **Encourage patient to contact the pharmacy for refills or they can request refills through [facility]**

## 2023-02-23 NOTE — Telephone Encounter (Signed)
Ozempic refill request complete

## 2023-03-02 ENCOUNTER — Encounter: Payer: Self-pay | Admitting: Internal Medicine

## 2023-03-03 DIAGNOSIS — Z961 Presence of intraocular lens: Secondary | ICD-10-CM | POA: Diagnosis not present

## 2023-03-03 DIAGNOSIS — H25812 Combined forms of age-related cataract, left eye: Secondary | ICD-10-CM | POA: Diagnosis not present

## 2023-03-03 DIAGNOSIS — H2512 Age-related nuclear cataract, left eye: Secondary | ICD-10-CM | POA: Diagnosis not present

## 2023-03-03 DIAGNOSIS — H25012 Cortical age-related cataract, left eye: Secondary | ICD-10-CM | POA: Diagnosis not present

## 2023-03-05 DIAGNOSIS — E1165 Type 2 diabetes mellitus with hyperglycemia: Secondary | ICD-10-CM | POA: Diagnosis not present

## 2023-03-07 ENCOUNTER — Ambulatory Visit (HOSPITAL_BASED_OUTPATIENT_CLINIC_OR_DEPARTMENT_OTHER): Payer: Medicare PPO | Attending: Sports Medicine | Admitting: Physical Therapy

## 2023-03-07 ENCOUNTER — Telehealth (HOSPITAL_BASED_OUTPATIENT_CLINIC_OR_DEPARTMENT_OTHER): Payer: Self-pay | Admitting: Physical Therapy

## 2023-03-07 DIAGNOSIS — R293 Abnormal posture: Secondary | ICD-10-CM | POA: Insufficient documentation

## 2023-03-07 DIAGNOSIS — G8929 Other chronic pain: Secondary | ICD-10-CM | POA: Insufficient documentation

## 2023-03-07 DIAGNOSIS — M25511 Pain in right shoulder: Secondary | ICD-10-CM | POA: Insufficient documentation

## 2023-03-07 NOTE — Telephone Encounter (Signed)
Patient no show, spoke with patient regarding missed appointment and reminded him of next appointment.   12:00 PM, 03/07/23 Wyman Songster PT, DPT Physical Therapist at [facility]

## 2023-03-10 DIAGNOSIS — R6882 Decreased libido: Secondary | ICD-10-CM | POA: Diagnosis not present

## 2023-03-10 DIAGNOSIS — N5201 Erectile dysfunction due to arterial insufficiency: Secondary | ICD-10-CM | POA: Diagnosis not present

## 2023-03-10 DIAGNOSIS — Z125 Encounter for screening for malignant neoplasm of prostate: Secondary | ICD-10-CM | POA: Diagnosis not present

## 2023-03-14 ENCOUNTER — Ambulatory Visit (HOSPITAL_BASED_OUTPATIENT_CLINIC_OR_DEPARTMENT_OTHER): Payer: Medicare PPO | Admitting: Physical Therapy

## 2023-03-21 DIAGNOSIS — H2000 Unspecified acute and subacute iridocyclitis: Secondary | ICD-10-CM | POA: Diagnosis not present

## 2023-04-04 ENCOUNTER — Encounter (HOSPITAL_BASED_OUTPATIENT_CLINIC_OR_DEPARTMENT_OTHER): Payer: Self-pay | Admitting: Rehabilitative and Restorative Service Providers"

## 2023-04-04 ENCOUNTER — Ambulatory Visit (HOSPITAL_BASED_OUTPATIENT_CLINIC_OR_DEPARTMENT_OTHER): Payer: Medicare PPO | Attending: Sports Medicine | Admitting: Rehabilitative and Restorative Service Providers"

## 2023-04-04 DIAGNOSIS — R293 Abnormal posture: Secondary | ICD-10-CM | POA: Diagnosis not present

## 2023-04-04 DIAGNOSIS — G8929 Other chronic pain: Secondary | ICD-10-CM | POA: Diagnosis not present

## 2023-04-04 DIAGNOSIS — M25511 Pain in right shoulder: Secondary | ICD-10-CM | POA: Insufficient documentation

## 2023-04-04 NOTE — Therapy (Signed)
OUTPATIENT PHYSICAL THERAPY TREATMENT/DISCHARGE   Patient Name: Austin Norris MRN: [id number] DOB:03-Apr-1956, 67 y.o., male Today's Date: 04/04/2023  END OF SESSION:  PT End of Session - 04/04/23 1148     Visit Number 9    Number of Visits 10    Authorization Type [employer] MCR    PT Start Time 1134    PT Stop Time 1203    PT Time Calculation (min) 29 min    Activity Tolerance Patient tolerated treatment well;No increased pain    Behavior During Therapy WFL for tasks assessed/performed                PHYSICAL THERAPY DISCHARGE SUMMARY  Visits from Start of Care: 9  Current functional level related to goals / functional outcomes: See below   Remaining deficits: posture   Education / Equipment: See below   Patient agrees to discharge. Patient goals were partially met. Patient is being discharged due to the patient's request.     Past Medical History:  Diagnosis Date   Diabetes mellitus    H/O epistaxis    History of stomach ulcers    30 YRS AGO   Hypercholesteremia    Hypertension    Shoulder pain, left    Umbilical hernia    Past Surgical History:  Procedure Laterality Date   CARDIAC CATHETERIZATION  1997   COLONOSCOPY     UMBILICAL HERNIA REPAIR N/A 07/11/2015   Procedure: LAPAROSCOPIC ASSISTED OPEN UMBILICAL HERNIA;  Surgeon: Luretha Murphy, MD;  Location: [facility];  Service: General;  Laterality: N/A;  With MESH   Patient Active Problem List   Diagnosis Date Noted   Delirium    Acute metabolic encephalopathy 03/22/2020   Type II or unspecified type diabetes mellitus without mention of complication, uncontrolled 12/10/2012   Pure hypercholesterolemia 12/10/2012   Essential hypertension 12/10/2012    REFERRING PROVIDER:  Christena Deem, MD    REFERRING DIAG: M79.601 (ICD-10-CM) - Pain in right arm   Rationale for Evaluation and Treatment: Rehabilitation  THERAPY DIAG:  Abnormal posture  Chronic right shoulder pain  ONSET DATE: 2-3 mo  ago   SUBJECTIVE:                                                                                                                                                                                           SUBJECTIVE STATEMENT: I am not doing my exercises at home. I don't need to come back.   PERTINENT HISTORY:  none  PAIN:  Are you having pain? yes: NPRS scale: 3/10 Pain location: lumbar Pain description: hurts Aggravating factors: laying down to rest Relieving factors:  rubs  PRECAUTIONS:  None  WEIGHT BEARING RESTRICTIONS:  No  FALLS:  Has patient fallen in last 6 months? No  OCCUPATION:  Transport planner of car lot, computer work at desk  PLOF:  Independent  PATIENT GOALS:  sleep   OBJECTIVE:   DIAGNOSTIC FINDINGS:  MRI 04/04/22 IMPRESSION: 1. Distal to the shoulder, no significant findings identified in the right upper arm. 2. Rotator cuff tendinosis with probable high-grade partial tearing of the distal supraspinatus tendon, suboptimally evaluated due to large field of view. Consider dedicated shoulder MRI for further evaluation. 3. Mild glenohumeral and acromioclavicular degenerative changes.   COGNITIVE STATUS: Within functional limits for tasks assessed   RED FLAGS: None    POSTURE:  EVAL: incr thoracic kyphosis, bil shoulder fwd rotation 8/24: forward head, incr thoracic kyphosis & bil shoulder fwd rotation  HAND DOMINANCE:  Right    Body Part #1 Shoulder  PALPATION: EVAL: denies TTP through neck/shoulders  Eval: ROM is equal and WFL but reports discomfort at end range flexion on Right side  8/24: GHJ AROM is [facility] but right hikes with flexion and IR behind back; cervical sidebend equal and tight  8/31 slight right sidebend when performing bil GHJ flexion    TREATMENT:      Treatment    04/04/23: Reviewed HEP; streamlined HEP to be Levator/Upper Trap stretch; Row with band; scapular retraction   Treatment                             02/14/23:        - Physio ball roll outs 5 seconds x10 -Lower trunk rotation x 5 with 5 sec hold -DKTC 30sec x3 -pelvic tilt x 20 with focus on form and proper muscle activation -tilt with ball squeeze x 20 -tilt with ball squeeze and bridge combo 2x10 -Hooklying clam GTB x30 with TrA -Standing lumbar AROM - Sit <> stand with 10lb dumbbell 2x10 -retro walking at cable 20lbs x8 (cues for increased step length and slow, controlled pace  Treatment                            01/31/23:        - Physio ball roll outs -Lower trunk rotation x 5 with 5 sec hold -pelvic tilt x 20 with focus on form and proper muscle activation -knee to chest stretch each side 2x30 sec -tilt with ball squeeze x 15 -tilt with ball squeeze and bridge combo x 15 - Seated OH flexion with theraball x15  - Sit <> stand with 10lb kettle bell.    Treatment                            01/24/23: -Seated lumbar flexion stretch 2x15 -Lower trunk rotation x 5 with 5 sec hold -L long axis distraction x 20 -pelvic tilt x 20 with focus on form and proper muscle activation -Review GTB exercises from HEP with max verbal and tactile cues for technique -knee to chest stretch each side 2x30 sec -knee to opposite shoulder stretch 2x30 sec ea side -tilt with SLR x 15 ea side -tilt with ball squeeze x 15 -tilt with ball squeeze and bridge combo x 15  Treatment                            9/21: * single knee to chest x 15s each * reviewed form with wrist stretch, cues to lower arm and relax upper trap * hooklying: 2 sets of 10 of each, with green band: sash (D2 flexion), bilat horiz abdct, bilat overhead reach "touch down" in painfree range, and bilat shoulder ER in painfree range * open book x 5 reps each side  * cat/cow x 5 reps, wag the tail x 5 * single arm doorway stretch (~110 deg abdct) x 10s x 2 each side;  cues for form   Treatment                            9/7: Upper trap & levator stretch 30sec ea/bil Seated chin tuck- 3sec x10 L wrist flex/ext stretching Door way stretch L only 15sec x2 Rows/ext Blue TB x20ea- Cues for posture Child's pose stretch 20sec x3 Cat/cow in quadruped 3sec hold x10  Piriformis stretch- 30sec x2 ea Hooklying marches with trA 2x10ea Bridges 2x10 HEP update- piriformis, child's pose, wrist flexor stretch  PATIENT EDUCATION:  Education details: HEP, exercise form/rationale Person educated: Patient Education method: Explanation, Demonstration, Tactile cues, Verbal cues, and Handouts Education comprehension: verbalized understanding, returned demonstration, verbal cues required, tactile cues required, and needs further education  HOME EXERCISE PROGRAM: [phone]  Access Code: [email or web address] URL: [email or web address] Date: 01/17/2023  - exercises with band to be completed in hooklying position, switched doorway stretch to one arm at a time. Issued green band today.   ASSESSMENT:  CLINICAL IMPRESSION: Pt reports non-compliance with HEP. Pt requests DC. States work is very stressful and he does not have time for exercises when he gets home. PT discussed with him doing what exercises he can at work. He states he just needs to do his work.    OBJECTIVE IMPAIRMENTS: postural dysfunction.   ACTIVITY LIMITATIONS:  sleepign  PARTICIPATION LIMITATIONS:  sleep  PERSONAL FACTORS:  work requirements  are also affecting patient's functional outcome.   REHAB POTENTIAL: Good  CLINICAL DECISION MAKING: Stable/uncomplicated  EVALUATION COMPLEXITY: Low   GOALS: Goals reviewed with patient? Yes 1.  Rt GHJ ROM without shoulder hike Baseline:  Goal status: unmet. 2.  Pt will verbalize postural corrections throughout his day Baseline:  Goal status:met 3.  Independent in long term HEP Baseline:  Goal status: met 4.  Sleep without limitation by  pain Baseline: pain up to 8/10 when attempting to sleep Goal status: met    PLAN:  DC visit      Referring diagnosis? M79.601 Treatment diagnosis? (if different than referring diagnosis) R29.3 What was this (referring dx) caused by? []  Surgery []  Fall [x]  Ongoing issue []  Arthritis []  Other: ____________  Laterality: [x]  Rt []  Lt []  Both  Check all possible CPT codes:  *CHOOSE 10 OR LESS*    [x]  97110 (Therapeutic Exercise)  []  92507 (SLP Treatment)  []  97112 (Neuro Re-ed)   []  92526 (Swallowing Treatment)   []  97116 (Gait Training)   []  [id number] (Cognitive Training, 1st 15 minutes) []  97140 (Manual Therapy)   []  97130 (Cognitive Training, each add'l 15 minutes)  []  97164 (Re-evaluation)                              []  Other, List  CPT Code ____________  []  97530 (Therapeutic Activities)     [x]  97535 (Self Care)   []  All codes above (97110 - 97535)  []  97012 (Mechanical Traction)  []  97014 (E-stim Unattended)  []  97032 (E-stim manual)  []  97033 (Ionto)  []  97035 (Ultrasound) []  97750 (Physical Performance Training) []  [id number] (Aquatic Therapy) []  97016 (Vasopneumatic Device) []  [id number] (Paraffin) []  97034 (Contrast Bath) []  97597 (Wound Care 1st 20 sq cm) []  97598 (Wound Care each add'l 20 sq cm) []  97760 (Orthotic Fabrication, Fitting, Training Initial) []  [id number] (Prosthetic Management and Training Initial) []  [id number] (Orthotic or Prosthetic Training/ Modification Subsequent)

## 2023-04-09 ENCOUNTER — Ambulatory Visit: Payer: Self-pay | Admitting: Cardiology

## 2023-04-14 ENCOUNTER — Encounter: Payer: Self-pay | Admitting: Internal Medicine

## 2023-04-30 ENCOUNTER — Ambulatory Visit: Payer: Medicare PPO | Attending: Cardiology | Admitting: Cardiology

## 2023-05-15 ENCOUNTER — Other Ambulatory Visit: Payer: Self-pay

## 2023-05-15 DIAGNOSIS — Z794 Long term (current) use of insulin: Secondary | ICD-10-CM

## 2023-05-15 MED ORDER — EMPAGLIFLOZIN 25 MG PO TABS
25.0000 mg | ORAL_TABLET | Freq: Every day | ORAL | 3 refills | Status: AC
Start: 2023-05-15 — End: ?

## 2023-05-18 ENCOUNTER — Encounter: Payer: Self-pay | Admitting: Endocrinology

## 2023-05-18 ENCOUNTER — Ambulatory Visit: Payer: Medicare HMO | Admitting: Endocrinology

## 2023-05-18 VITALS — BP 120/70 | HR 68 | Resp 20 | Ht 63.0 in | Wt 179.4 lb

## 2023-05-18 DIAGNOSIS — E1140 Type 2 diabetes mellitus with diabetic neuropathy, unspecified: Secondary | ICD-10-CM | POA: Diagnosis not present

## 2023-05-18 DIAGNOSIS — Z794 Long term (current) use of insulin: Secondary | ICD-10-CM

## 2023-05-18 LAB — POCT GLYCOSYLATED HEMOGLOBIN (HGB A1C): Hemoglobin A1C: 7 % — AB (ref 4.0–5.6)

## 2023-05-18 NOTE — Progress Notes (Signed)
Outpatient Endocrinology Note [place] Antwoin Lackey, MD  05/18/23  Patient's Name: Austin Norris    DOB: 12-05-1955    MRN: [id number]                                                    REASON OF VISIT: Follow-up of type 2 diabetes mellitus  PCP: Lorenda Ishihara, MD  HISTORY OF PRESENT ILLNESS:   Johnathyn LYNK LABERGE is a 68 y.o. old male with past medical history listed below, is here for follow up of type 2 diabetes mellitus.   Pertinent Diabetes History: Patient was diagnosed with type 2 diabetes mellitus several years ago and has been on insulin therapy for several years.  He has fair control of type 2 diabetes mellitus.  Chronic Diabetes Complications : Retinopathy: no. Last ophthalmology exam was done on annually reportedly. Nephropathy: no Peripheral neuropathy: yes, burning pain in lower legs.  On gabapentin. Coronary artery disease: yes Stroke: no  Relevant comorbidities and cardiovascular risk factors: Obesity: yes Body mass index is 31.78 kg/m.  Hypertension: yes Hyperlipidemia. Yes, on statin.  Current / Home Diabetic regimen includes:  Metformin XR 1000 mg AM and 1000 mg in PM Tresiba 30 units at bedtime.  Ozempic 1 mg weekly.  Jardiance 25 mg daily.  No longer requiring NovoLog has not taken for few months.  Prior diabetic medications: Trulicity, Ozempic. Marcelline Deist, cost was expensive.  Glycemic data:    CONTINUOUS GLUCOSE MONITORING SYSTEM (CGMS) INTERPRETATION: At today's visit, we reviewed CGM downloads. The full report is scanned in the media. Reviewing the CGM trends, blood glucose are as follows:  FreeStyle Libre 3 CGM-  Sensor Download (Sensor download was reviewed and summarized below.) Dates: December 22 to May 01, 2022, for 14 days.  Not able to download recent CGM data today in the clinic.    Hypoglycemia: Patient denies hypoglycemic episodes. Patient has hypoglycemia awareness.  Factors modifying glucose control: 1.  Diabetic diet  assessment: 2-3 meals a day.  2.  Staying active or exercising: No formal exercise.  3.  Medication compliance: compliant all of the time.  # Erectile dysfunction: Tried Viagra did not work.  He is on Cialis however he eventually stopped not working.  He was following with PCP in the past.  He is going to see urology in the near future.  Interval history  Not able to download freestyle libre CGM in the clinic today.  Downloaded data was from last year from 2024.  Diabetes regimen as noted above.  Lately he has not been taking Ozempic and Jardiance, has high deductible and expensive however he is going to get it, he has some leftover, and has noticed hyperglycemia when not being on Ozempic and Jardiance.  Hemoglobin A1c 7% today.  Denies complaints of numbness and ting of the feet.  No vision problem.  No other complaints today.  REVIEW OF SYSTEMS As per history of present illness.   PAST MEDICAL HISTORY: Past Medical History:  Diagnosis Date   Diabetes mellitus    H/O epistaxis    History of stomach ulcers    30 YRS AGO   Hypercholesteremia    Hypertension    Shoulder pain, left    Umbilical hernia     PAST SURGICAL HISTORY: Past Surgical History:  Procedure Laterality Date   CARDIAC CATHETERIZATION  1997  COLONOSCOPY     UMBILICAL HERNIA REPAIR N/A 07/11/2015   Procedure: LAPAROSCOPIC ASSISTED OPEN UMBILICAL HERNIA;  Surgeon: Luretha Murphy, MD;  Location: [facility];  Service: General;  Laterality: N/A;  With MESH    ALLERGIES: No Known Allergies  FAMILY HISTORY:  Family History  Problem Relation Age of Onset   Heart disease Mother    Diabetes Father    Diabetes Sister     SOCIAL HISTORY: Social History   Socioeconomic History   Marital status: Married    Spouse name: Not on file   Number of children: 5   Years of education: Not on file   Highest education level: Not on file  Occupational History   Occupation: Investment banker, corporate: [employer]  Tobacco Use    Smoking status: Former    Current packs/day: 0.00    Average packs/day: 1 pack/day for 10.0 years (10.0 ttl pk-yrs)    Types: Cigarettes    Start date: 05/22/1987    Quit date: 05/21/1997    Years since quitting: 26.0   Smokeless tobacco: Never  Vaping Use   Vaping status: Never Used  Substance and Sexual Activity   Alcohol use: No   Drug use: No   Sexual activity: Not on file  Other Topics Concern   Not on file  Social History Narrative   Not on file   Social Drivers of Health   Financial Resource Strain: Not on file  Food Insecurity: Not on file  Transportation Needs: Not on file  Physical Activity: Not on file  Stress: Not on file  Social Connections: Not on file    MEDICATIONS:  Current Outpatient Medications  Medication Sig Dispense Refill   Alcohol Swabs 70 % PADS Use as directed 300 each 1   aspirin 81 MG chewable tablet Chew 1 tablet (81 mg total) by mouth daily. 90 tablet 1   carvedilol (COREG) 12.5 MG tablet TAKE 1 TABLET TWICE DAILY 180 tablet 3   Continuous Blood Gluc Sensor (FREESTYLE LIBRE 3 SENSOR) MISC Place 1 sensor on the skin every 14 days. Use to check glucose continuously 6 each 2   empagliflozin (JARDIANCE) 25 MG TABS tablet Take 1 tablet (25 mg total) by mouth daily before breakfast. 90 tablet 3   gabapentin (NEURONTIN) 300 MG capsule TAKE 1 CAPSULE BY MOUTH THREE TIMES A DAY AS NEEDED 270 capsule 1   glucose blood (CONTOUR NEXT TEST) test strip USE TO CHECK BLOOD SUGAR TWICE A DAY 100 strip 2   insulin degludec (TRESIBA FLEXTOUCH) 200 UNIT/ML FlexTouch Pen Inject 30 Units into the skin at bedtime. 18 mL 2   Insulin Syringe-Needle U-100 (INSULIN SYRINGE 1CC/31GX5/16") 31G X 5/16" 1 ML MISC Use to inject lantus daily 90 each 1   latanoprost (XALATAN) 0.005 % ophthalmic solution SMARTSIG:In Eye(s)     metFORMIN (GLUCOPHAGE-XR) 500 MG 24 hr tablet Take 2 tablets (1,000 mg total) by mouth 2 (two) times daily with a meal. 180 tablet 4   MICROLET LANCETS MISC  Use to check blood sugar 2 times a day 200 each 1   omeprazole (PRILOSEC) 40 MG capsule Take 40 mg by mouth daily.     rosuvastatin (CRESTOR) 5 MG tablet Take 1 tablet (5 mg total) by mouth daily. 90 tablet 3   Semaglutide, 1 MG/DOSE, (OZEMPIC, 1 MG/DOSE,) 2 MG/1.5ML SOPN Inject 1 mg into the skin once a week.     Semaglutide, 1 MG/DOSE, 4 MG/3ML SOPN Inject 1 mg as directed once  a week. 6 mL 3   spironolactone (ALDACTONE) 25 MG tablet Take 25 mg by mouth daily.     tadalafil (CIALIS) 10 MG tablet Use as needed 10 tablet 2   amLODipine-olmesartan (AZOR) 5-40 MG tablet TAKE 1 TABLET BY MOUTH DAILY AT 10 PM. 90 tablet 0   insulin aspart (NOVOLOG FLEXPEN) 100 UNIT/ML FlexPen INJECT 35 UNITS INTO THE SKIN IN THE MORNING, AT NOON, AND AT BEDTIME. (Patient not taking: Reported on 05/18/2023) 45 mL 1   No current facility-administered medications for this visit.    PHYSICAL EXAM: Vitals:   05/18/23 1410  BP: 120/70  Pulse: 68  Resp: 20  SpO2: 99%  Weight: 179 lb 6.4 oz (81.4 kg)  Height: 5\' 3"  (1.6 m)   Body mass index is 31.78 kg/m.  Wt Readings from Last 3 Encounters:  05/18/23 179 lb 6.4 oz (81.4 kg)  01/13/23 185 lb 12.8 oz (84.3 kg)  09/25/22 190 lb 6.4 oz (86.4 kg)    General: Well developed, well nourished male in no apparent distress.  HEENT: AT/[place], no external lesions.  Eyes: Conjunctiva clear and no icterus. Neck: Neck supple  Lungs: Respirations not labored Neurologic: Alert, oriented, normal speech Extremities / Skin: Dry. No sores or rashes noted.  Psychiatric: Does not appear depressed or anxious  Diabetic Foot Exam - Simple   No data filed    LABS Reviewed Lab Results  Component Value Date   HGBA1C 7.0 (A) 05/18/2023   HGBA1C 7.1 (H) 01/06/2023   HGBA1C 7.2 08/19/2022   Lab Results  Component Value Date   FRUCTOSAMINE 298 (H) 09/26/2020   Lab Results  Component Value Date   CHOL 142 01/06/2023   HDL 47.70 01/06/2023   LDLCALC 69 01/06/2023   LDLDIRECT  85 04/09/2022   TRIG 126.0 01/06/2023   CHOLHDL 3 01/06/2023   Lab Results  Component Value Date   MICRALBCREAT NOTE 04/18/2022   MICRALBCREAT 0.9 06/26/2021   Lab Results  Component Value Date   CREATININE 0.66 01/06/2023   Lab Results  Component Value Date   GFR 96.80 01/06/2023    ASSESSMENT / PLAN  1. Type 2 diabetes mellitus with diabetic neuropathy, with long-term current use of insulin (HCC)      Diabetes Mellitus type 2, complicated by diabetic neuropathy. - Diabetic status / severity: Controlled.  Lab Results  Component Value Date   HGBA1C 7.0 (A) 05/18/2023    - Hemoglobin A1c goal : <7%  He has relatively controlled diabetes mellitus.    - Medications: See below. Diabetes regimen: Continue Tresiba 30 units daily. Continue metformin 1000 mg extended release 2 times a day with meals.   Continue Jardiance 25 mg daily. Continue Ozempic 1 mg weekly.  Okay to use NovoLog as needed to correct hypoglycemia.  Provided samples of Ozempic and Jardiance in the clinic, see nurse notes from today, until you will be able to get from the prescription.  He mention he is waiting to get from the pharmacy.  He mentioned he can also consider to get Ozempic and [place] from [place] if needed.  - Home glucose testing: Freestyle libre 3 CGM and check as needed. - Discussed/ Gave Hypoglycemia treatment plan.  # Consult : not required at this time.   # Annual urine for microalbuminuria/ creatinine ratio, no microalbuminuria currently.  Will check today. Last  Lab Results  Component Value Date   MICRALBCREAT NOTE 04/18/2022    # Foot check nightly / neuropathy, continue gabapentin.  #  Annual dilated diabetic eye exams.   - Diet: Make healthy diabetic food choices - Life style / activity / exercise: Discussed.  2. Blood pressure  -  BP Readings from Last 1 Encounters:  05/18/23 120/70    - Control is in target.  - No change in current plans.  3. Lipid  status / Hyperlipidemia - Last  Lab Results  Component Value Date   LDLCALC 69 01/06/2023   - Continue Crestor 5 mg daily.  Diagnoses and all orders for this visit:  Type 2 diabetes mellitus with diabetic neuropathy, with long-term current use of insulin (HCC) -     POCT glycosylated hemoglobin (Hb A1C) -     Microalbumin / creatinine urine ratio     DISPOSITION Follow up in clinic in 4  months suggested.   All questions answered and patient verbalized understanding of the plan.  [place] Marsela Kuan, MD [facility] Endocrinology [facility] [address] [place], [address] [place], [place] [address] Phone # [phone]  At least part of this note was generated using voice recognition software. Inadvertent word errors may have occurred, which were not recognized during the proofreading process.

## 2023-05-18 NOTE — Progress Notes (Signed)
Samples of this drug Jardiance 25mg  were given to the patient, quantity 2, Lot Number [id number] x 2   Samples of this drug Ozempic 0.25-0.5mg  were given to the patient, quantity 1, Lot Number [id number]

## 2023-05-19 LAB — MICROALBUMIN / CREATININE URINE RATIO
Creatinine, Urine: 123 mg/dL (ref 20–320)
Microalb Creat Ratio: 11 mg/g{creat} (ref <30)
Microalb, Ur: 1.3 mg/dL

## 2023-05-21 ENCOUNTER — Other Ambulatory Visit: Payer: Self-pay | Admitting: Cardiology

## 2023-05-21 DIAGNOSIS — I10 Essential (primary) hypertension: Secondary | ICD-10-CM

## 2023-05-29 ENCOUNTER — Other Ambulatory Visit: Payer: Self-pay

## 2023-05-29 DIAGNOSIS — E1140 Type 2 diabetes mellitus with diabetic neuropathy, unspecified: Secondary | ICD-10-CM

## 2023-05-29 MED ORDER — FREESTYLE LIBRE 3 SENSOR MISC: 2 refills | Status: DC

## 2023-06-11 ENCOUNTER — Other Ambulatory Visit: Payer: Self-pay | Admitting: Cardiology

## 2023-06-11 DIAGNOSIS — I10 Essential (primary) hypertension: Secondary | ICD-10-CM

## 2023-06-16 ENCOUNTER — Other Ambulatory Visit: Payer: Self-pay | Admitting: Cardiology

## 2023-06-16 DIAGNOSIS — I10 Essential (primary) hypertension: Secondary | ICD-10-CM

## 2023-06-25 ENCOUNTER — Telehealth: Payer: Self-pay

## 2023-06-25 ENCOUNTER — Other Ambulatory Visit: Payer: Self-pay

## 2023-06-25 NOTE — Telephone Encounter (Signed)
 Patient call for advice from MD regarding if it is ok to fast for Ramadan.

## 2023-06-25 NOTE — Telephone Encounter (Addendum)
 It is okay to fast.  I recommend to take 50% of the current dose of Evaristo Bury he has been taking, which will be 15 units daily.    Recommend to monitor blood sugar regularly on his continuous glucose monitor.  If blood sugar is less than 100 persistently may need to decrease the dose of Lantus further, okay to decrease by 2 units every few days if the blood sugar is less than 100 persistently, during the time of fasting, asked to call our clinic at that time.  Okay to take other medications as they are.

## 2023-06-25 NOTE — Telephone Encounter (Signed)
 Patient given MD advisement. No further questions at this time

## 2023-07-12 ENCOUNTER — Other Ambulatory Visit: Payer: Self-pay | Admitting: Cardiology

## 2023-07-12 DIAGNOSIS — I10 Essential (primary) hypertension: Secondary | ICD-10-CM

## 2023-07-26 ENCOUNTER — Other Ambulatory Visit: Payer: Self-pay | Admitting: Endocrinology

## 2023-07-26 DIAGNOSIS — E118 Type 2 diabetes mellitus with unspecified complications: Secondary | ICD-10-CM

## 2023-08-07 ENCOUNTER — Other Ambulatory Visit: Payer: Self-pay | Admitting: Cardiology

## 2023-08-07 DIAGNOSIS — I10 Essential (primary) hypertension: Secondary | ICD-10-CM

## 2023-08-11 ENCOUNTER — Other Ambulatory Visit: Payer: Self-pay | Admitting: Cardiology

## 2023-08-11 DIAGNOSIS — I10 Essential (primary) hypertension: Secondary | ICD-10-CM

## 2023-08-27 ENCOUNTER — Other Ambulatory Visit: Payer: Self-pay | Admitting: Cardiology

## 2023-08-27 DIAGNOSIS — I10 Essential (primary) hypertension: Secondary | ICD-10-CM

## 2023-08-28 NOTE — Telephone Encounter (Signed)
 Dr. Jesus Morones pt. He was last seen in 03/2022. He is passed his 3rd attempt. Does Dr. Albert Huff want to refill? Please advise.

## 2023-09-14 ENCOUNTER — Ambulatory Visit: Payer: Medicare HMO | Admitting: Endocrinology

## 2023-09-28 ENCOUNTER — Ambulatory Visit: Payer: Self-pay | Admitting: Endocrinology

## 2023-09-28 ENCOUNTER — Ambulatory Visit: Admitting: Endocrinology

## 2023-09-28 ENCOUNTER — Encounter: Payer: Self-pay | Admitting: Endocrinology

## 2023-09-28 VITALS — BP 138/60 | HR 51 | Resp 20 | Ht 63.0 in | Wt 180.0 lb

## 2023-09-28 DIAGNOSIS — Z794 Long term (current) use of insulin: Secondary | ICD-10-CM | POA: Diagnosis not present

## 2023-09-28 DIAGNOSIS — E1140 Type 2 diabetes mellitus with diabetic neuropathy, unspecified: Secondary | ICD-10-CM | POA: Diagnosis not present

## 2023-09-28 DIAGNOSIS — E1165 Type 2 diabetes mellitus with hyperglycemia: Secondary | ICD-10-CM

## 2023-09-28 LAB — POCT GLYCOSYLATED HEMOGLOBIN (HGB A1C): Hemoglobin A1C: 7.1 % — AB (ref 4.0–5.6)

## 2023-09-28 MED ORDER — OZEMPIC (1 MG/DOSE) 2 MG/1.5ML ~~LOC~~ SOPN: 1.0000 mg | PEN_INJECTOR | SUBCUTANEOUS | 4 refills | Status: AC

## 2023-09-28 MED ORDER — NOVOLOG FLEXPEN 100 UNIT/ML ~~LOC~~ SOPN: PEN_INJECTOR | SUBCUTANEOUS | 4 refills | Status: DC

## 2023-09-28 MED ORDER — TRESIBA FLEXTOUCH 200 UNIT/ML ~~LOC~~ SOPN
30.0000 [IU] | PEN_INJECTOR | Freq: Every day | SUBCUTANEOUS | 2 refills | Status: DC
Start: 2023-09-28 — End: 2023-09-29

## 2023-09-28 MED ORDER — ROSUVASTATIN CALCIUM 5 MG PO TABS: 5.0000 mg | ORAL_TABLET | Freq: Every day | ORAL | 3 refills | Status: DC

## 2023-09-28 NOTE — Progress Notes (Signed)
 Patient heart rate low at 51/bpm. Patient takes beta blocker BID. MD made aware.

## 2023-09-28 NOTE — Progress Notes (Addendum)
 Outpatient Endocrinology Note [place] Mignon Bechler, MD  09/28/23  Patient's Name: Austin Norris    DOB: 1955-07-27    MRN: [id number]                                                    REASON OF VISIT: Follow-up of type 2 diabetes mellitus  PCP: Arva Lathe, MD  HISTORY OF PRESENT ILLNESS:   Sem AUDRICK LAMOUREAUX is a 68 y.o. old male with past medical history listed below, is here for follow up of type 2 diabetes mellitus.   Pertinent Diabetes History: Patient was diagnosed with type 2 diabetes mellitus several years ago and has been on insulin  therapy for several years.  He has fair control of type 2 diabetes mellitus.  Chronic Diabetes Complications : Retinopathy: no. Last ophthalmology exam was done on annually reportedly. Nephropathy: no Peripheral neuropathy: yes, burning pain in lower legs.  On gabapentin . Coronary artery disease: yes Stroke: no  Relevant comorbidities and cardiovascular risk factors: Obesity: yes Body mass index is 31.89 kg/m.  Hypertension: yes Hyperlipidemia. Yes, on statin.  Current / Home Diabetic regimen includes:  Metformin  XR 1000 mg AM and 1000 mg in PM Tresiba  30 units at bedtime.  Ozempic  1 mg weekly.  Currently not taking for last 2 to 3 months. Jardiance  25 mg daily. Taking NovoLog  20 units 2 times a day with meals.  Prior diabetic medications: Trulicity , Ozempic . Farxiga , cost was expensive.  Glycemic data:    CONTINUOUS GLUCOSE MONITORING SYSTEM (CGMS) INTERPRETATION: At today's visit, we reviewed CGM downloads. The full report is scanned in the media. Reviewing the CGM trends, blood glucose are as follows:  FreeStyle Libre 3 CGM-  Sensor Download (Sensor download was reviewed and summarized below.) Dates: May 6 to September 28, 2023, for 14 days.  CGM uses 97% GMI 6.9% Average blood sugar 148      Interpretation: Mostly acceptable blood sugar with random hyperglycemia with blood sugar up to 200s range postprandially and  rarely blood sugar up to 290 range related with meals.  Random occasional low blood sugar in 50-60 range overnight, in between the meals following hyperglycemia.  Some of the days no episode of hypoglycemia.  Patient reports CGM reading low blood sugar, he double checks with the fingerstick blood sugar /glucometer and in the range of low 100 and no hypoglycemic symptoms.  Hypoglycemia: Patient denies hypoglycemic episodes. Patient has hypoglycemia awareness.  Factors modifying glucose control: 1.  Diabetic diet assessment: 2-3 meals a day.  2.  Staying active or exercising: No formal exercise.  3.  Medication compliance: compliant all of the time.  # Erectile dysfunction: Tried Viagra did not work.  He is on Cialis  however he eventually stopped not working.  He was following with PCP in the past.  He is going to see urology in the near future.  Interval history  Freestyle libre CGM data as reviewed above.  Diabetes regimen as reviewed above.  Patient reports noted low blood sugar on CGM monitor he double checked with glucometer fingerstick blood sugar and used to be in 100 range and denies having any hypoglycemic symptoms and he thinks it is related to sensor issue.  Hemoglobin A1c 7.1%.  He is currently not taking Ozempic , stopped about 2 to 3 months ago due to nausea and vomiting however he restarted NovoLog   taking 20 units with meals 2 times a day.  He reports compliance with other antidiabetic medication as noted above.  No other complaints today.  REVIEW OF SYSTEMS As per history of present illness.   PAST MEDICAL HISTORY: Past Medical History:  Diagnosis Date   Diabetes mellitus    H/O epistaxis    History of stomach ulcers    30 YRS AGO   Hypercholesteremia    Hypertension    Shoulder pain, left    Umbilical hernia     PAST SURGICAL HISTORY: Past Surgical History:  Procedure Laterality Date   CARDIAC CATHETERIZATION  1997   COLONOSCOPY     UMBILICAL HERNIA REPAIR N/A  07/11/2015   Procedure: LAPAROSCOPIC ASSISTED OPEN UMBILICAL HERNIA;  Surgeon: Jacolyn Matar, MD;  Location: [facility];  Service: General;  Laterality: N/A;  With MESH    ALLERGIES: No Known Allergies  FAMILY HISTORY:  Family History  Problem Relation Age of Onset   Heart disease Mother    Diabetes Father    Diabetes Sister     SOCIAL HISTORY: Social History   Socioeconomic History   Marital status: Married    Spouse name: Not on file   Number of children: 5   Years of education: Not on file   Highest education level: Not on file  Occupational History   Occupation: Investment banker, corporate: [employer]  Tobacco Use   Smoking status: Former    Current packs/day: 0.00    Average packs/day: 1 pack/day for 10.0 years (10.0 ttl pk-yrs)    Types: Cigarettes    Start date: 05/22/1987    Quit date: 05/21/1997    Years since quitting: 26.3   Smokeless tobacco: Never  Vaping Use   Vaping status: Never Used  Substance and Sexual Activity   Alcohol  use: No   Drug use: No   Sexual activity: Not on file  Other Topics Concern   Not on file  Social History Narrative   Not on file   Social Drivers of Health   Financial Resource Strain: Not on file  Food Insecurity: Not on file  Transportation Needs: Not on file  Physical Activity: Not on file  Stress: Not on file  Social Connections: Not on file    MEDICATIONS:  Current Outpatient Medications  Medication Sig Dispense Refill   Alcohol  Swabs  70 % PADS Use as directed 300 each 1   amLODipine -olmesartan  (AZOR ) 5-40 MG tablet Take 1 tablet by mouth daily at 10 pm. 15 tablet 0   aspirin  81 MG chewable tablet Chew 1 tablet (81 mg total) by mouth daily. 90 tablet 1   carvedilol  (COREG ) 12.5 MG tablet TAKE 1 TABLET TWICE DAILY 30 tablet 0   Continuous Glucose Sensor (FREESTYLE LIBRE 3 SENSOR) MISC Place 1 sensor on the skin every 14 days. Use to check glucose continuously 6 each 2   empagliflozin  (JARDIANCE ) 25 MG TABS tablet Take 1 tablet  (25 mg total) by mouth daily before breakfast. 90 tablet 3   gabapentin  (NEURONTIN ) 300 MG capsule TAKE 1 CAPSULE BY MOUTH THREE TIMES A DAY AS NEEDED 270 capsule 1   glucose blood (CONTOUR NEXT TEST) test strip USE TO CHECK BLOOD SUGAR TWICE A DAY 100 strip 2   Insulin  Syringe-Needle U-100 (INSULIN  SYRINGE 1CC/31GX5/16") 31G X 5/16" 1 ML MISC Use to inject lantus  daily 90 each 1   latanoprost (XALATAN) 0.005 % ophthalmic solution SMARTSIG:In Eye(s)     metFORMIN  (GLUCOPHAGE -XR) 500 MG 24 hr tablet  TAKE 2 TABLETS BY MOUTH TWICE A DAY WITH MEALS 360 tablet 2   MICROLET LANCETS MISC Use to check blood sugar 2 times a day 200 each 1   omeprazole (PRILOSEC) 40 MG capsule Take 40 mg by mouth daily.     spironolactone  (ALDACTONE ) 25 MG tablet Take 25 mg by mouth daily.     tadalafil  (CIALIS ) 10 MG tablet Use as needed 10 tablet 2   insulin  aspart (NOVOLOG  FLEXPEN) 100 UNIT/ML FlexPen INJECT 5-10 UNITS INTO THE SKIN IN THE MORNING, AT NOON, AND AT BEDTIME, maximum 30 units/day. 45 mL 4   insulin  degludec (TRESIBA  FLEXTOUCH) 200 UNIT/ML FlexTouch Pen Inject 30 Units into the skin at bedtime. 18 mL 2   rosuvastatin  (CRESTOR ) 5 MG tablet Take 1 tablet (5 mg total) by mouth daily. 90 tablet 3   Semaglutide , 1 MG/DOSE, (OZEMPIC , 1 MG/DOSE,) 2 MG/1.5ML SOPN Inject 1 mg into the skin once a week. 6 mL 4   Semaglutide , 1 MG/DOSE, 4 MG/3ML SOPN Inject 1 mg as directed once a week. (Patient not taking: Reported on 09/28/2023) 6 mL 3   No current facility-administered medications for this visit.    PHYSICAL EXAM: Vitals:   09/28/23 1539 09/28/23 1540  BP: (!) 156/60 138/60  Pulse: (!) 51   Resp: 20   SpO2: 96%   Weight: 180 lb (81.6 kg)   Height: 5\' 3"  (1.6 m)     Body mass index is 31.89 kg/m.  Wt Readings from Last 3 Encounters:  09/28/23 180 lb (81.6 kg)  05/18/23 179 lb 6.4 oz (81.4 kg)  01/13/23 185 lb 12.8 oz (84.3 kg)    General: Well developed, well nourished male in no apparent distress.   HEENT: AT/[place], no external lesions.  Eyes: Conjunctiva clear and no icterus. Neck: Neck supple  Lungs: Respirations not labored Neurologic: Alert, oriented, normal speech Extremities / Skin: Dry.  Psychiatric: Does not appear depressed or anxious  Diabetic Foot Exam - Simple   No data filed    LABS Reviewed Lab Results  Component Value Date   HGBA1C 7.1 (A) 09/28/2023   HGBA1C 7.0 (A) 05/18/2023   HGBA1C 7.1 (H) 01/06/2023   Lab Results  Component Value Date   FRUCTOSAMINE 298 (H) 09/26/2020   Lab Results  Component Value Date   CHOL 142 01/06/2023   HDL 47.70 01/06/2023   LDLCALC 69 01/06/2023   LDLDIRECT 85 04/09/2022   TRIG 126.0 01/06/2023   CHOLHDL 3 01/06/2023   Lab Results  Component Value Date   MICRALBCREAT 11 05/18/2023   MICRALBCREAT NOTE 04/18/2022   Lab Results  Component Value Date   CREATININE 0.66 01/06/2023   Lab Results  Component Value Date   GFR 96.80 01/06/2023    ASSESSMENT / PLAN  1. Type 2 diabetes mellitus with diabetic neuropathy, with long-term current use of insulin  (HCC)   2. Uncontrolled type 2 diabetes mellitus with hyperglycemia, with long-term current use of insulin  (HCC)   3. Type 2 diabetes mellitus with hyperglycemia, with long-term current use of insulin  (HCC)     Diabetes Mellitus type 2, complicated by diabetic neuropathy. - Diabetic status / severity: Controlled.  Lab Results  Component Value Date   HGBA1C 7.1 (A) 09/28/2023    - Hemoglobin A1c goal : <6.5%  He has relatively controlled diabetes mellitus.  He had stopped Ozempic  due to GI intolerance however he would like to restart it.  Will restart on the lower dose.  Patient currently has Ozempic  1  mg pen at home, advised to use 20 clicks to get about 0.25 mg weekly for 4 weeks followed by gradual increase to 40 clicks to get around 0.5 mg weekly for 4 weeks and then increase to full dose of 1 mg weekly.  Total clicks to get 1 mg weekly is 78 clicks.  -  Medications: See below. Diabetes regimen: Continue Tresiba  30 units daily. Continue metformin  1000 mg extended release 2 times a day with meals.   Continue Jardiance  25 mg daily. Adjust Ozempic  as mentioned above.  Restart Ozempic .  Used to be on Ozempic  1 mg weekly. Decrease NovoLog  from 20 units 2 times a day to 5 - 10 units with meals 2 times a day.  Gradually decrease dose of NovoLog  as go up on the dose of Ozempic .   - Home glucose testing: Freestyle libre 3 CGM and check as needed. - Discussed/ Gave Hypoglycemia treatment plan.  # Consult : not required at this time.   # Annual urine for microalbuminuria/ creatinine ratio, no microalbuminuria currently.    Lab Results  Component Value Date   MICRALBCREAT 11 05/18/2023    # Foot check nightly / neuropathy, continue gabapentin .  # Annual dilated diabetic eye exams.   - Diet: Make healthy diabetic food choices - Life style / activity / exercise: Discussed.  2. Blood pressure  -  BP Readings from Last 1 Encounters:  09/28/23 138/60    - Control is in target.  - No change in current plans.  Patient has low heart rate in the clinic today, 51, asymptomatic.  Patient has been taking carvedilol  12.5 mg 2 times a day, advised to decrease carvedilol  to 1 time a day only and call cardiology/PCP to adjust antihypertensive medication and continue monitoring of bradycardia and dose of carvedilol .  Advised to monitor blood pressure and heart rate at home as well.  3. Lipid status / Hyperlipidemia - Last  Lab Results  Component Value Date   LDLCALC 69 01/06/2023   - Continue Crestor  5 mg daily.  Diagnoses and all orders for this visit:  Type 2 diabetes mellitus with diabetic neuropathy, with long-term current use of insulin  (HCC) -     POCT glycosylated hemoglobin (Hb A1C) -     Semaglutide , 1 MG/DOSE, (OZEMPIC , 1 MG/DOSE,) 2 MG/1.5ML SOPN; Inject 1 mg into the skin once a week. -     Lipid panel -     Basic metabolic panel  with GFR -     Hemoglobin A1c  Uncontrolled type 2 diabetes mellitus with hyperglycemia, with long-term current use of insulin  (HCC) -     insulin  aspart (NOVOLOG  FLEXPEN) 100 UNIT/ML FlexPen; INJECT 5-10 UNITS INTO THE SKIN IN THE MORNING, AT NOON, AND AT BEDTIME, maximum 30 units/day.  Type 2 diabetes mellitus with hyperglycemia, with long-term current use of insulin  (HCC) -     insulin  degludec (TRESIBA  FLEXTOUCH) 200 UNIT/ML FlexTouch Pen; Inject 30 Units into the skin at bedtime.  Other orders -     rosuvastatin  (CRESTOR ) 5 MG tablet; Take 1 tablet (5 mg total) by mouth daily.    DISPOSITION Follow up in clinic in 4  months suggested.   All questions answered and patient verbalized understanding of the plan.  [place] Kenny Rea, MD [facility] Endocrinology [facility] [address] [place], [address] [place], [place] [address] Phone # [phone]  At least part of this note was generated using voice recognition software. Inadvertent word errors may have occurred, which were  not recognized during the proofreading process.

## 2023-09-29 ENCOUNTER — Encounter: Payer: Self-pay | Admitting: Endocrinology

## 2023-09-29 ENCOUNTER — Other Ambulatory Visit: Payer: Self-pay | Admitting: Endocrinology

## 2023-09-29 ENCOUNTER — Telehealth: Payer: Self-pay | Admitting: Cardiology

## 2023-09-29 DIAGNOSIS — E1165 Type 2 diabetes mellitus with hyperglycemia: Secondary | ICD-10-CM

## 2023-09-29 DIAGNOSIS — I10 Essential (primary) hypertension: Secondary | ICD-10-CM

## 2023-09-29 NOTE — Telephone Encounter (Signed)
 STAT if HR is under 50 or over 120  (normal HR is 60-100 beats per minute)  What is your heart rate? 45-60   Do you have a log of your heart rate readings (document readings)?    Do you have any other symptoms? Thinks his bp is low as well. There is no other symptoms

## 2023-09-29 NOTE — Telephone Encounter (Signed)
 Returned call to pt-   Patient states that he has been taking Carvedilol  12.5 BID, he went yesterday to the doctor- and was told that his heart rate is too low at 45-60. He states his blood pressure is still up and down 130-180/85 and lower. He states he has headaches sometimes ( like a pressure  in his eye) but not related to high blood pressure.   He states he currently takes about 4 blood pressure pills and he wants to know if he can take less medications.

## 2023-09-30 MED ORDER — CARVEDILOL 6.25 MG PO TABS: 6.2500 mg | ORAL_TABLET | Freq: Two times a day (BID) | ORAL | 0 refills | Status: DC

## 2023-09-30 NOTE — Telephone Encounter (Signed)
 S/w pt is aware of recommendations. Pt will decrease coreg  one (1) tablet by mouth ( 6.25 mg) twice daily. Pt will check BP at home and record. Pt will f/u with PCP on BP.  Pt stated just received glaucoma surgery a week ago and stated to call eye doctor. Gave ED precautions.

## 2023-09-30 NOTE — Telephone Encounter (Signed)
 Last office visit in 2023. Given the low heart rate reduce carvedilol  to 6.25 mg p.o. twice daily, monitor outpatient/ambulatory blood pressure and pulse rate. Please refer him to ambulatory Pharm.D. clinic for hypertension management if not currently being managed by PCP.  If he has sensations of headaches or pressure behind the eye please have him follow-up with PCP as this could be also findings to suggest glaucoma and other etiologies.  If symptoms worsening have him go to the ED for further evaluation and management  Olinda Bertrand, DO, [facility]

## 2023-10-02 ENCOUNTER — Other Ambulatory Visit: Payer: Self-pay | Admitting: Cardiology

## 2023-10-02 DIAGNOSIS — I10 Essential (primary) hypertension: Secondary | ICD-10-CM

## 2023-10-17 ENCOUNTER — Other Ambulatory Visit: Payer: Self-pay | Admitting: Endocrinology

## 2023-10-17 DIAGNOSIS — E1140 Type 2 diabetes mellitus with diabetic neuropathy, unspecified: Secondary | ICD-10-CM

## 2023-10-20 ENCOUNTER — Other Ambulatory Visit: Payer: Self-pay

## 2023-10-20 ENCOUNTER — Other Ambulatory Visit (HOSPITAL_COMMUNITY): Payer: Self-pay

## 2023-10-20 DIAGNOSIS — Z794 Long term (current) use of insulin: Secondary | ICD-10-CM

## 2023-10-20 MED ORDER — METFORMIN HCL ER 500 MG PO TB24: ORAL_TABLET | ORAL | 2 refills | Status: DC

## 2023-10-21 ENCOUNTER — Other Ambulatory Visit (HOSPITAL_COMMUNITY): Payer: Self-pay

## 2023-10-21 ENCOUNTER — Telehealth: Payer: Self-pay

## 2023-10-21 ENCOUNTER — Other Ambulatory Visit: Payer: Self-pay

## 2023-10-21 DIAGNOSIS — Z794 Long term (current) use of insulin: Secondary | ICD-10-CM

## 2023-10-21 MED ORDER — INSULIN DEGLUDEC 100 UNIT/ML ~~LOC~~ SOLN: SUBCUTANEOUS | 3 refills | Status: AC

## 2023-10-21 NOTE — Telephone Encounter (Signed)
 Pharmacy Patient Advocate Encounter   Received notification from Pt Calls Messages that prior authorization for Ozempic  2mg  is required/requested.   Insurance verification completed.   The patient is insured through [employer] .   Per test claim: PA required and submitted KEY/EOC/Request #: [id number] due to authorization already on file from 06/02/23 to 04/27/2024.

## 2023-10-22 NOTE — Telephone Encounter (Signed)
 PA sent to PA team.

## 2023-10-25 ENCOUNTER — Other Ambulatory Visit: Payer: Self-pay | Admitting: Cardiology

## 2023-10-25 DIAGNOSIS — I10 Essential (primary) hypertension: Secondary | ICD-10-CM

## 2023-11-24 ENCOUNTER — Telehealth: Payer: Self-pay

## 2023-11-24 NOTE — Telephone Encounter (Signed)
   Pre-operative Risk Assessment    Patient Name: Austin Norris  DOB: [birth year] MRN: [id number]   Date of last office visit: 04/08/22 Austin LARGE, DO Date of next office visit: NONE  Request for Surgical Clearance    Procedure:  Dental Extraction - Amount of Teeth to be Pulled:  SURGICAL EXTRACTION 12 TEETH WITH THE PLACEMENT OF 4 IMPLANTS ON THE UPPER AND 4 IMPLANTS ON THE LOWER WITH SOME BONE REDUCTION  Date of Surgery:  Clearance TBD                                Surgeon:  Austin Norris, DDS Surgeon's Group or Practice Name:   [facility] Phone number:  [phone] Fax number:  [phone]   Type of Clearance Requested:   - Medical  - Pharmacy:  Hold Aspirin      Type of Anesthesia:  MAC   Additional requests/questions:    Signed, Austin Norris   11/24/2023, 3:07 PM

## 2023-11-25 NOTE — Telephone Encounter (Signed)
 Pt has been scheduled to see Glendia Ferrier, [facility] 12/02/23 for preop clearance.

## 2023-11-25 NOTE — Telephone Encounter (Signed)
   Name: Austin Norris  DOB: 07/03/1955  MRN: [id number]  Primary Cardiologist: Madonna Large, DO  Chart reviewed as part of pre-operative protocol coverage. Because of Austin Norris's past medical history and time since last visit, he will require a follow-up in-office visit in order to better assess preoperative cardiovascular risk. Last seen 04/08/2022.   Pre-op covering staff: - Please schedule appointment and call patient to inform them. If patient already had an upcoming appointment within acceptable timeframe, please add pre-op clearance to the appointment notes so provider is aware. - Please contact requesting surgeon's office via preferred method (i.e, phone, fax) to inform them of need for appointment prior to surgery.    Lamarr Satterfield, NP  11/25/2023, 10:46 AM

## 2023-12-02 ENCOUNTER — Encounter: Payer: Self-pay | Admitting: Physician Assistant

## 2023-12-02 ENCOUNTER — Ambulatory Visit: Attending: Cardiology | Admitting: Physician Assistant

## 2023-12-02 VITALS — BP 138/60 | HR 62 | Ht 63.0 in | Wt 178.0 lb

## 2023-12-02 DIAGNOSIS — Z01810 Encounter for preprocedural cardiovascular examination: Secondary | ICD-10-CM

## 2023-12-02 DIAGNOSIS — I10 Essential (primary) hypertension: Secondary | ICD-10-CM | POA: Diagnosis not present

## 2023-12-02 NOTE — Patient Instructions (Signed)
 Medication Instructions:  Your physician recommends that you continue on your current medications as directed. Please refer to the Current Medication list given to you today.  *If you need a refill on your cardiac medications before your next appointment, please call your pharmacy*  Lab Work: None ordered  If you have labs (blood work) drawn today and your tests are completely normal, you will receive your results only by: MyChart Message (if you have MyChart) OR A paper copy in the mail If you have any lab test that is abnormal or we need to change your treatment, we will call you to review the results.  Testing/Procedures: None ordered  Follow-Up: At [facility], you and your health needs are our priority.  As part of our continuing mission to provide you with exceptional heart care, our providers are all part of one team.  This team includes your primary Cardiologist (physician) and Advanced Practice Providers or APPs (Physician Assistants and Nurse Practitioners) who all work together to provide you with the care you need, when you need it.  Your next appointment:   1 year(s)  Provider:   Madonna Large, DO    We recommend signing up for the patient portal called MyChart.  Sign up information is provided on this After Visit Summary.  MyChart is used to connect with patients for Virtual Visits (Telemedicine).  Patients are able to view lab/test results, encounter notes, upcoming appointments, etc.  Non-urgent messages can be sent to your provider as well.   To learn more about what you can do with MyChart, go to [email or web address].   Other Instructions

## 2023-12-02 NOTE — Telephone Encounter (Signed)
 Notes faxed to surgeon Glendia Ferrier, PA-C    12/02/2023 5:39 PM

## 2023-12-02 NOTE — Progress Notes (Signed)
 OFFICE NOTE:    Date:  12/02/2023  ID:  Austin Norris, DOB 16-Mar-1956, MRN [id number] PCP: Elliot Charm, MD  [facility] Providers Cardiologist:  Madonna Large, DO        Hypertension  TTE 02/12/2021: EF 60-65, mild LVH, mild LAE, trace TR, mild PI Exercise MPI 02/18/2021: EF 43, diaphragmatic attenuation versus ischemia, low risk RA US  07/25/2021: No RAS bilaterally Hyperlipidemia  Diabetes mellitus on insulin  Former smoker Obesity  BPH        Discussed the use of AI scribe software for clinical note transcription with the patient, who gave verbal consent to proceed. History of Present Illness Austin Norris is a 68 y.o. male who returns for surgical clearance. Last seen by Dr. Large in 03/2022.  He needs extensive dental work.  Aspirin  needs to be held.  No current chest symptoms such as pain, pressure, or tightness are present. He reports no shortness of breath or syncope. He notes mild, intermittent ankle swelling. He notes mild ankle swelling, which he reports was explained by his doctor as being due to his blood pressure medication. He quit smoking approximately 20 years ago. He is able to walk up a flight of stairs and walk a block or two on level ground without stopping.    ROS-See HPI    Studies Reviewed:  EKG Interpretation Date/Time:  Wednesday December 02 2023 14:19:01 EDT Ventricular Rate:  60 PR Interval:  202 QRS Duration:  90 QT Interval:  410 QTC Calculation: 410 R Axis:   2  Text Interpretation: Normal sinus rhythm Normal ECG Confirmed by Lelon Hamilton [phone]) on 12/02/2023 2:54:13 PM    Results LABS personally reviewed (Per [facility]) A1c: 7.1 (09/2023) LDL: 83 (09/2023) Triglycerides: 102 (09/2023) HDL: 51 (09/2023)         Physical Exam:  VS:  BP 138/60 (BP Location: Left Arm, Patient Position: Sitting, Cuff Size: Normal)   Pulse 62   Ht 5' 3 (1.6 m)   Wt 178 lb (80.7 kg)   SpO2 97%   BMI 31.53 kg/m        Wt Readings  from Last 3 Encounters:  12/02/23 178 lb (80.7 kg)  09/28/23 180 lb (81.6 kg)  05/18/23 179 lb 6.4 oz (81.4 kg)    Constitutional:      Appearance: Healthy appearance. Not in distress.  Neck:     Vascular: JVD normal.  Pulmonary:     Breath sounds: Normal breath sounds. No wheezing. No rales.  Cardiovascular:     Normal rate. Regular rhythm.     Murmurs: There is no murmur.  Edema:    Peripheral edema absent.  Abdominal:     Palpations: Abdomen is soft.       Assessment and Plan:    Assessment & Plan Preoperative cardiovascular examination Mr. Austin Norris perioperative risk of a major cardiac event is 0.9% according to the Revised Cardiac Risk Index (RCRI).  Therefore, he is at low risk for perioperative complications.   His functional capacity is good at 4.31 METs according to the Duke Activity Status Index (DASI). Recommendations: According to ACC/AHA guidelines, no further cardiovascular testing needed.  The patient may proceed to surgery at acceptable risk.   Antiplatelet and/or Anticoagulation Recommendations: Aspirin  can be held for 7 days prior to his surgery.  Please resume Aspirin  post operatively when it is felt to be safe from a bleeding standpoint. Benign hypertension BP is well controlled on current Rx. - Continue amlodipine /olmesartan  5/40 mg  daily, carvedilol  6.25 mg twice daily, spironolactone  25 mg daily        Dispo:  Return in about 1 year (around 12/01/2024) for Routine Follow Up, w/ Dr. Michele.  Signed, Glendia Ferrier, PA-C

## 2023-12-17 ENCOUNTER — Other Ambulatory Visit: Payer: Self-pay

## 2023-12-17 ENCOUNTER — Telehealth: Payer: Self-pay

## 2023-12-17 ENCOUNTER — Ambulatory Visit: Admitting: Dietician

## 2023-12-17 DIAGNOSIS — E1165 Type 2 diabetes mellitus with hyperglycemia: Secondary | ICD-10-CM

## 2023-12-17 DIAGNOSIS — E118 Type 2 diabetes mellitus with unspecified complications: Secondary | ICD-10-CM

## 2023-12-17 MED ORDER — FREESTYLE LIBRE 3 PLUS SENSOR MISC: 1.0000 | 3 refills | Status: AC

## 2023-12-17 NOTE — Telephone Encounter (Signed)
 Patient came into the office stating his app on his phone was not working properly. Looked at patient's app and its stating the sensor did not have enough data. Patient confirmed it was paired successfully. Made patient aware that it was probably a sensor issue, but also asked the diabetic educator to take a look.

## 2023-12-17 NOTE — Progress Notes (Signed)
 Patient came as a walk in today as he requests help as his Austin Norris does not show reports and cannot be downloaded.  Tech support was called.  Spoke to 2 people.  Patient needs to download the new Libre app (black background with yellow butterfly).  He cannot download this today due to password issues at the app store.  He may need to change a new sensor.  Patient may have accidentally done something that erased his data.  He will know in a couple more days if the data comes up.  Patient also needs the new sensor prescribed.  RN did this.    Patient would like to have a referral to see me for an appointment.  Referral request sent to MD.  Leita Constable, RD, LDN, CDCES, DipACLM

## 2023-12-18 ENCOUNTER — Other Ambulatory Visit: Payer: Self-pay | Admitting: Endocrinology

## 2023-12-18 DIAGNOSIS — E1165 Type 2 diabetes mellitus with hyperglycemia: Secondary | ICD-10-CM

## 2024-01-12 ENCOUNTER — Telehealth: Payer: Self-pay | Admitting: Endocrinology

## 2024-01-12 DIAGNOSIS — N529 Male erectile dysfunction, unspecified: Secondary | ICD-10-CM

## 2024-01-12 NOTE — Telephone Encounter (Signed)
 MEDICATION: tadalafil  (CIALIS ) 10 MG tablet   PHARMACY:  [id number] - [place], [place] - [address] [facility] [address] (Ph: [phone])   HAS THE PATIENT CONTACTED THEIR PHARMACY?  Yes  LAST REFILL:  @@LASTREFILL @  IS THIS A 90 DAY SUPPLY : Yes  IS PATIENT OUT OF MEDICATION: Yes  IF NOT; HOW MUCH IS LEFT:   LAST APPOINTMENT DATE: 09/28/2023  NEXT APPOINTMENT DATE:@10 /15/2025  DO WE HAVE YOUR PERMISSION TO LEAVE A DETAILED MESSAGE?: Yes  OTHER COMMENTS:    **Let patient know to contact pharmacy at the end of the day to make sure medication is ready. **  ** Please notify patient to allow 48-72 hours to process**  **Encourage patient to contact the pharmacy for refills or they can request refills through [facility]**

## 2024-01-13 MED ORDER — TADALAFIL 10 MG PO TABS
ORAL_TABLET | ORAL | 2 refills | Status: AC
Start: 2024-01-13 — End: ?

## 2024-01-13 NOTE — Addendum Note (Signed)
 Addended by: Cherylene Ferrufino, [place] on: 01/13/2024 01:59 PM   Modules accepted: Orders

## 2024-01-25 ENCOUNTER — Other Ambulatory Visit

## 2024-01-28 ENCOUNTER — Ambulatory Visit: Admitting: Endocrinology

## 2024-01-29 DIAGNOSIS — E1140 Type 2 diabetes mellitus with diabetic neuropathy, unspecified: Secondary | ICD-10-CM | POA: Diagnosis not present

## 2024-02-01 ENCOUNTER — Ambulatory Visit: Admitting: Dietician

## 2024-02-05 ENCOUNTER — Other Ambulatory Visit

## 2024-02-10 ENCOUNTER — Ambulatory Visit: Admitting: Endocrinology

## 2024-03-15 DIAGNOSIS — Z23 Encounter for immunization: Secondary | ICD-10-CM | POA: Diagnosis not present

## 2024-03-15 DIAGNOSIS — Z1211 Encounter for screening for malignant neoplasm of colon: Secondary | ICD-10-CM | POA: Diagnosis not present

## 2024-03-15 DIAGNOSIS — I10 Essential (primary) hypertension: Secondary | ICD-10-CM | POA: Diagnosis not present

## 2024-03-15 DIAGNOSIS — E1169 Type 2 diabetes mellitus with other specified complication: Secondary | ICD-10-CM | POA: Diagnosis not present

## 2024-03-15 DIAGNOSIS — K219 Gastro-esophageal reflux disease without esophagitis: Secondary | ICD-10-CM | POA: Diagnosis not present

## 2024-03-15 DIAGNOSIS — Z794 Long term (current) use of insulin: Secondary | ICD-10-CM | POA: Diagnosis not present

## 2024-04-08 ENCOUNTER — Other Ambulatory Visit: Payer: Self-pay | Admitting: Endocrinology

## 2024-04-08 DIAGNOSIS — E1140 Type 2 diabetes mellitus with diabetic neuropathy, unspecified: Secondary | ICD-10-CM

## 2024-04-09 ENCOUNTER — Other Ambulatory Visit: Payer: Self-pay | Admitting: Endocrinology

## 2024-04-09 DIAGNOSIS — E1140 Type 2 diabetes mellitus with diabetic neuropathy, unspecified: Secondary | ICD-10-CM

## 2024-05-25 ENCOUNTER — Other Ambulatory Visit: Payer: Self-pay | Admitting: Endocrinology

## 2024-05-25 DIAGNOSIS — E118 Type 2 diabetes mellitus with unspecified complications: Secondary | ICD-10-CM

## 2024-05-30 ENCOUNTER — Encounter: Payer: Self-pay | Admitting: Gastroenterology

## 2024-05-31 ENCOUNTER — Other Ambulatory Visit: Payer: Self-pay

## 2024-05-31 DIAGNOSIS — E1165 Type 2 diabetes mellitus with hyperglycemia: Secondary | ICD-10-CM

## 2024-05-31 MED ORDER — NOVOLOG FLEXPEN 100 UNIT/ML ~~LOC~~ SOPN: PEN_INJECTOR | SUBCUTANEOUS | 4 refills | Status: AC

## 2024-05-31 MED ORDER — ROSUVASTATIN CALCIUM 5 MG PO TABS: 5.0000 mg | ORAL_TABLET | Freq: Every day | ORAL | 3 refills | Status: AC
